# Patient Record
Sex: Female | Born: 1985
Health system: Southern US, Community
[De-identification: ages and names within clinical notes are randomized; demographics above are authoritative.]

## PROBLEM LIST (undated history)

## (undated) ENCOUNTER — Inpatient Hospital Stay (HOSPITAL_COMMUNITY): Payer: Self-pay

## (undated) DIAGNOSIS — F329 Major depressive disorder, single episode, unspecified: Secondary | ICD-10-CM

## (undated) DIAGNOSIS — F419 Anxiety disorder, unspecified: Secondary | ICD-10-CM

## (undated) DIAGNOSIS — I10 Essential (primary) hypertension: Secondary | ICD-10-CM

## (undated) DIAGNOSIS — M1990 Unspecified osteoarthritis, unspecified site: Secondary | ICD-10-CM

## (undated) DIAGNOSIS — K219 Gastro-esophageal reflux disease without esophagitis: Secondary | ICD-10-CM

## (undated) DIAGNOSIS — O039 Complete or unspecified spontaneous abortion without complication: Secondary | ICD-10-CM

## (undated) DIAGNOSIS — C73 Malignant neoplasm of thyroid gland: Secondary | ICD-10-CM

## (undated) DIAGNOSIS — R519 Headache, unspecified: Secondary | ICD-10-CM

## (undated) DIAGNOSIS — F32A Depression, unspecified: Secondary | ICD-10-CM

## (undated) HISTORY — PX: APPENDECTOMY: SHX54

## (undated) HISTORY — DX: Depression, unspecified: F32.A

---
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
## 2009-02-05 ENCOUNTER — Emergency Department (HOSPITAL_COMMUNITY): Admission: EM | Admit: 2009-02-05 | Discharge: 2009-02-06 | Payer: Self-pay | Admitting: Emergency Medicine

## 2009-03-30 ENCOUNTER — Emergency Department (HOSPITAL_COMMUNITY): Admission: EM | Admit: 2009-03-30 | Discharge: 2009-03-30 | Payer: Self-pay | Admitting: Family Medicine

## 2009-08-06 ENCOUNTER — Emergency Department (HOSPITAL_COMMUNITY): Admission: EM | Admit: 2009-08-06 | Discharge: 2009-08-06 | Payer: Self-pay | Admitting: Family Medicine

## 2010-07-13 ENCOUNTER — Emergency Department (HOSPITAL_COMMUNITY)
Admission: EM | Admit: 2010-07-13 | Discharge: 2010-07-13 | Disposition: A | Discharge status: Home / Self Care | Payer: Self-pay | Attending: Emergency Medicine | Admitting: Emergency Medicine

## 2010-07-13 DIAGNOSIS — G44209 Tension-type headache, unspecified, not intractable: Secondary | ICD-10-CM | POA: Insufficient documentation

## 2010-07-13 DIAGNOSIS — I10 Essential (primary) hypertension: Secondary | ICD-10-CM | POA: Insufficient documentation

## 2012-05-26 ENCOUNTER — Emergency Department (HOSPITAL_BASED_OUTPATIENT_CLINIC_OR_DEPARTMENT_OTHER)
Admission: EM | Admit: 2012-05-26 | Discharge: 2012-05-26 | Disposition: A | Discharge status: Home / Self Care | Payer: No Typology Code available for payment source | Attending: Emergency Medicine | Admitting: Emergency Medicine

## 2012-05-26 ENCOUNTER — Emergency Department (HOSPITAL_BASED_OUTPATIENT_CLINIC_OR_DEPARTMENT_OTHER): Payer: No Typology Code available for payment source

## 2012-05-26 ENCOUNTER — Encounter (HOSPITAL_BASED_OUTPATIENT_CLINIC_OR_DEPARTMENT_OTHER): Payer: Self-pay

## 2012-05-26 DIAGNOSIS — M542 Cervicalgia: Secondary | ICD-10-CM

## 2012-05-26 DIAGNOSIS — S39012A Strain of muscle, fascia and tendon of lower back, initial encounter: Secondary | ICD-10-CM

## 2012-05-26 DIAGNOSIS — S0993XA Unspecified injury of face, initial encounter: Secondary | ICD-10-CM | POA: Insufficient documentation

## 2012-05-26 DIAGNOSIS — Y92410 Unspecified street and highway as the place of occurrence of the external cause: Secondary | ICD-10-CM | POA: Insufficient documentation

## 2012-05-26 DIAGNOSIS — S335XXA Sprain of ligaments of lumbar spine, initial encounter: Secondary | ICD-10-CM | POA: Insufficient documentation

## 2012-05-26 DIAGNOSIS — S199XXA Unspecified injury of neck, initial encounter: Secondary | ICD-10-CM | POA: Insufficient documentation

## 2012-05-26 DIAGNOSIS — Y9389 Activity, other specified: Secondary | ICD-10-CM | POA: Insufficient documentation

## 2012-05-26 MED ORDER — OXYCODONE-ACETAMINOPHEN 5-325 MG PO TABS: 1.0000 | ORAL_TABLET | ORAL | Status: DC | PRN

## 2012-05-26 NOTE — ED Notes (Signed)
Pt returned from radiology.
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
## 2012-05-26 NOTE — ED Notes (Signed)
Patient transported to X-ray 

## 2013-08-22 ENCOUNTER — Emergency Department (HOSPITAL_COMMUNITY)
Admission: EM | Admit: 2013-08-22 | Discharge: 2013-08-22 | Disposition: A | Discharge status: Home / Self Care | Payer: BC Managed Care – PPO | Attending: Emergency Medicine | Admitting: Emergency Medicine

## 2013-08-22 ENCOUNTER — Encounter (HOSPITAL_COMMUNITY): Payer: Self-pay | Admitting: Emergency Medicine

## 2013-08-22 DIAGNOSIS — Z880 Allergy status to penicillin: Secondary | ICD-10-CM | POA: Diagnosis not present

## 2013-08-22 DIAGNOSIS — M25569 Pain in unspecified knee: Secondary | ICD-10-CM | POA: Insufficient documentation

## 2013-08-22 DIAGNOSIS — M25562 Pain in left knee: Secondary | ICD-10-CM

## 2013-08-22 MED ORDER — NAPROXEN 500 MG PO TABS: 500.0000 mg | ORAL_TABLET | Freq: Two times a day (BID) | ORAL | Status: DC
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
## 2013-08-22 NOTE — ED Notes (Signed)
Pt reports left knee pain x 1 week that is worse when she stands all day at her job. PT says it gets stiff and sore. Denies fall or injury.

## 2013-08-22 NOTE — Discharge Instructions (Signed)
Take the prescribed medication as directed.  May wish to start wearing knee brace to help with stability/ swelling/ pain Follow-up with Dr. Percell Miller if no improvement in the next few days. Return to the ED for new or worsening symptoms.

## 2013-08-24 NOTE — ED Provider Notes (Signed)
Medical screening examination/treatment/procedure(s) were performed by non-physician practitioner and as supervising physician I was immediately available for consultation/collaboration.  Leota Jacobsen, MD 08/24/13 [phone]

## 2013-10-05 ENCOUNTER — Emergency Department (HOSPITAL_COMMUNITY)
Admission: EM | Admit: 2013-10-05 | Discharge: 2013-10-05 | Discharge status: Against Medical Advice | Payer: BC Managed Care – PPO | Attending: Emergency Medicine | Admitting: Emergency Medicine

## 2013-10-05 ENCOUNTER — Encounter (HOSPITAL_COMMUNITY): Payer: Self-pay | Admitting: Emergency Medicine

## 2013-10-05 DIAGNOSIS — R509 Fever, unspecified: Secondary | ICD-10-CM | POA: Insufficient documentation

## 2013-10-05 DIAGNOSIS — R52 Pain, unspecified: Secondary | ICD-10-CM | POA: Insufficient documentation

## 2013-10-05 DIAGNOSIS — R51 Headache: Secondary | ICD-10-CM | POA: Diagnosis not present

## 2013-10-05 NOTE — ED Notes (Signed)
Pt states last night at work she started having chills  Pt states this morning she woke up with a fever  Took tylenol  Pt states she went to work  Her fever returned  Pt states she has had a headache and body aches since last night

## 2013-10-05 NOTE — ED Notes (Signed)
Pt called x 3 for [facility] room. No answer.

## 2013-10-06 ENCOUNTER — Emergency Department (HOSPITAL_COMMUNITY)
Admission: EM | Admit: 2013-10-06 | Discharge: 2013-10-06 | Disposition: A | Discharge status: Home / Self Care | Payer: BC Managed Care – PPO | Attending: Emergency Medicine | Admitting: Emergency Medicine

## 2013-10-06 ENCOUNTER — Encounter (HOSPITAL_COMMUNITY): Payer: Self-pay | Admitting: Emergency Medicine

## 2013-10-06 DIAGNOSIS — R52 Pain, unspecified: Secondary | ICD-10-CM | POA: Diagnosis not present

## 2013-10-06 DIAGNOSIS — R059 Cough, unspecified: Secondary | ICD-10-CM | POA: Diagnosis not present

## 2013-10-06 DIAGNOSIS — R0602 Shortness of breath: Secondary | ICD-10-CM | POA: Diagnosis not present

## 2013-10-06 DIAGNOSIS — R509 Fever, unspecified: Secondary | ICD-10-CM | POA: Insufficient documentation

## 2013-10-06 DIAGNOSIS — J029 Acute pharyngitis, unspecified: Secondary | ICD-10-CM | POA: Diagnosis present

## 2013-10-06 DIAGNOSIS — Z791 Long term (current) use of non-steroidal anti-inflammatories (NSAID): Secondary | ICD-10-CM | POA: Insufficient documentation

## 2013-10-06 DIAGNOSIS — B9689 Other specified bacterial agents as the cause of diseases classified elsewhere: Secondary | ICD-10-CM

## 2013-10-06 DIAGNOSIS — R05 Cough: Secondary | ICD-10-CM | POA: Insufficient documentation

## 2013-10-06 DIAGNOSIS — J329 Chronic sinusitis, unspecified: Secondary | ICD-10-CM | POA: Insufficient documentation

## 2013-10-06 DIAGNOSIS — Z880 Allergy status to penicillin: Secondary | ICD-10-CM | POA: Insufficient documentation

## 2013-10-06 MED ORDER — SULFAMETHOXAZOLE-TRIMETHOPRIM 800-160 MG PO TABS: 1.0000 | ORAL_TABLET | Freq: Two times a day (BID) | ORAL | Status: DC

## 2013-10-06 MED ORDER — DIPHENHYDRAMINE HCL 25 MG PO TABS: 25.0000 mg | ORAL_TABLET | Freq: Four times a day (QID) | ORAL | Status: DC | PRN

## 2013-10-06 NOTE — ED Notes (Addendum)
Pt reports 2 day hx of fever, dry cough and sore throat. Also, generalized body aches. Treated with Aleve at 1100 today Also added shortness of breath, lungs clear anterior and posterior. No wheezing noted

## 2013-10-06 NOTE — ED Provider Notes (Signed)
CSN: [id number]     Arrival date & time 10/06/13  1502 History  This chart was scribed for non-physician practitioner Alvina Chou, PA-C, working with Tanna Furry, MD by Zola Button, ED Scribe. This patient was seen in room [place] and the patient's care was started at 3:40 PM.      Chief Complaint  Patient presents with  . Fever    temp this am 102.0  . Sore Throat  . Generalized Body Aches  . Cough    dry cough x 2 days  . Shortness of Breath    no wheezing noted     The history is provided by the patient. No language interpreter was used.   HPI Comments: Jessica Mcpherson is a 28 y.o. female who presents to the Emergency Department complaining of gradual onset, constant sore throat that began two days ago. She notes associated fever of 101-102 degrees F, dry cough, SOB, generalized myalgias, sinus pressure and congestion. Patient denies sick contacts, but she works at a school. She has allergies to penicillin.   Past Medical History  Diagnosis Date  . Bronchitis    Past Surgical History  Procedure Laterality Date  . Cesarean section     Family History  Problem Relation Age of Onset  . Diabetes Mother   . Hypertension Other   . Diabetes Other    History  Substance Use Topics  . Smoking status: Never Smoker   . Smokeless tobacco: Not on file  . Alcohol Use: No   OB History   Grav Para Term Preterm Abortions TAB SAB Ect Mult Living                 Review of Systems  Constitutional: Positive for fever.  HENT: Positive for congestion, sinus pressure and sore throat.   Respiratory: Positive for cough and shortness of breath.   Musculoskeletal: Positive for myalgias.  All other systems reviewed and are negative.     Allergies  Penicillins  Home Medications   Prior to Admission medications   Medication Sig Start Date End Date Taking? Authorizing Provider  naproxen (NAPROSYN) 500 MG tablet Take 1 tablet (500 mg total) by mouth 2 (two) times daily with a  meal. 08/22/13   Larene Pickett, PA-C   BP 120/70  Pulse 73  Temp(Src) 98.3 F (36.8 C) (Oral)  Resp 18  Wt 160 lb (72.576 kg)  SpO2 100%  LMP 09/17/2013 Physical Exam  Nursing note and vitals reviewed. Constitutional: She is oriented to person, place, and time. She appears well-developed and well-nourished. No distress.  HENT:  Head: Normocephalic and atraumatic.  Mouth/Throat: Oropharynx is clear and moist. No oropharyngeal exudate.  Eyes: Pupils are equal, round, and reactive to light.  Neck: Neck supple.  Cardiovascular: Normal rate.   Pulmonary/Chest: Effort normal and breath sounds normal. No respiratory distress. She has no wheezes. She has no rales.  Musculoskeletal: She exhibits no edema.  Lymphadenopathy:    She has cervical adenopathy.  Neurological: She is alert and oriented to person, place, and time. No cranial nerve deficit.  Skin: Skin is warm and dry. No rash noted.  Psychiatric: She has a normal mood and affect. Her behavior is normal.    ED Course  Procedures  DIAGNOSTIC STUDIES: Oxygen Saturation is 100% on RA, nml by my interpretation.    COORDINATION OF CARE: 3:42 PM-Discussed treatment plan which includes antihistamines with pt at bedside and pt agreed to plan.   Labs Review Labs Reviewed -  No data to display  Imaging Review No results found.   EKG Interpretation None      MDM   Final diagnoses:  Sinusitis, bacterial    Patient likely has sinusitis and will be treated with amoxicillin and benadryl. Vitals stable and patient afebrile. No further evaluation needed at this time.   I personally performed the services described in this documentation, which was scribed in my presence. The recorded information has been reviewed and is accurate.    Alvina Chou, PA-C 10/06/13 1711

## 2013-10-06 NOTE — Discharge Instructions (Signed)
Take amoxicillin as directed until gone. Take benadryl as needed for congestion. Refer to attached documents for more information.

## 2013-10-11 NOTE — ED Provider Notes (Signed)
Medical screening examination/treatment/procedure(s) were performed by non-physician practitioner and as supervising physician I was immediately available for consultation/collaboration.   EKG Interpretation None        Ephraim Hamburger, MD 10/11/13 1605

## 2013-12-30 ENCOUNTER — Observation Stay (HOSPITAL_COMMUNITY): Payer: BC Managed Care – PPO | Admitting: Anesthesiology

## 2013-12-30 ENCOUNTER — Encounter (HOSPITAL_COMMUNITY)
Admission: EM | Disposition: A | Discharge status: Home / Self Care | Payer: Self-pay | Source: Home / Self Care | Attending: Emergency Medicine

## 2013-12-30 ENCOUNTER — Encounter (HOSPITAL_COMMUNITY): Payer: Self-pay | Admitting: Emergency Medicine

## 2013-12-30 ENCOUNTER — Emergency Department (HOSPITAL_COMMUNITY): Payer: BC Managed Care – PPO

## 2013-12-30 ENCOUNTER — Observation Stay (HOSPITAL_COMMUNITY)
Admission: EM | Admit: 2013-12-30 | Discharge: 2013-12-31 | Disposition: A | Discharge status: Home / Self Care | Payer: BC Managed Care – PPO | Attending: Surgery | Admitting: Surgery

## 2013-12-30 DIAGNOSIS — Z880 Allergy status to penicillin: Secondary | ICD-10-CM | POA: Diagnosis not present

## 2013-12-30 DIAGNOSIS — R1031 Right lower quadrant pain: Secondary | ICD-10-CM | POA: Diagnosis present

## 2013-12-30 DIAGNOSIS — N898 Other specified noninflammatory disorders of vagina: Secondary | ICD-10-CM | POA: Diagnosis not present

## 2013-12-30 DIAGNOSIS — R109 Unspecified abdominal pain: Secondary | ICD-10-CM

## 2013-12-30 DIAGNOSIS — A599 Trichomoniasis, unspecified: Secondary | ICD-10-CM | POA: Diagnosis not present

## 2013-12-30 DIAGNOSIS — K353 Acute appendicitis with localized peritonitis, without perforation or gangrene: Secondary | ICD-10-CM

## 2013-12-30 DIAGNOSIS — K3580 Unspecified acute appendicitis: Principal | ICD-10-CM | POA: Diagnosis present

## 2013-12-30 HISTORY — PX: LAPAROSCOPIC APPENDECTOMY: SHX408

## 2013-12-30 LAB — WET PREP, GENITAL
Clue Cells Wet Prep HPF POC: NONE SEEN
Yeast Wet Prep HPF POC: NONE SEEN

## 2013-12-30 LAB — CBC WITH DIFFERENTIAL/PLATELET
Basophils Absolute: 0 10*3/uL (ref 0.0–0.1)
Basophils Relative: 0 % (ref 0–1)
Eosinophils Absolute: 0.2 10*3/uL (ref 0.0–0.7)
Eosinophils Relative: 1 % (ref 0–5)
HCT: 39.3 % (ref 36.0–46.0)
Hemoglobin: 13.3 g/dL (ref 12.0–15.0)
Lymphocytes Relative: 19 % (ref 12–46)
Lymphs Abs: 2.8 10*3/uL (ref 0.7–4.0)
MCH: 29.7 pg (ref 26.0–34.0)
MCHC: 33.8 g/dL (ref 30.0–36.0)
MCV: 87.7 fL (ref 78.0–100.0)
Monocytes Absolute: 1 10*3/uL (ref 0.1–1.0)
Monocytes Relative: 7 % (ref 3–12)
Neutro Abs: 10.6 10*3/uL — ABNORMAL HIGH (ref 1.7–7.7)
Neutrophils Relative %: 73 % (ref 43–77)
Platelets: 257 10*3/uL (ref 150–400)
RBC: 4.48 MIL/uL (ref 3.87–5.11)
RDW: 13.4 % (ref 11.5–15.5)
WBC: 14.6 10*3/uL — ABNORMAL HIGH (ref 4.0–10.5)

## 2013-12-30 LAB — URINALYSIS, ROUTINE W REFLEX MICROSCOPIC
Bilirubin Urine: NEGATIVE
Glucose, UA: NEGATIVE mg/dL
Hgb urine dipstick: NEGATIVE
Ketones, ur: NEGATIVE mg/dL
Nitrite: NEGATIVE
Protein, ur: NEGATIVE mg/dL
Specific Gravity, Urine: 1.026 (ref 1.005–1.030)
Urobilinogen, UA: 0.2 mg/dL (ref 0.0–1.0)
pH: 6 (ref 5.0–8.0)

## 2013-12-30 LAB — URINE MICROSCOPIC-ADD ON

## 2013-12-30 LAB — COMPREHENSIVE METABOLIC PANEL
ALT: 16 U/L (ref 0–35)
AST: 17 U/L (ref 0–37)
Albumin: 3.6 g/dL (ref 3.5–5.2)
Alkaline Phosphatase: 68 U/L (ref 39–117)
Anion gap: 12 (ref 5–15)
BUN: 10 mg/dL (ref 6–23)
CO2: 22 mEq/L (ref 19–32)
Calcium: 9.2 mg/dL (ref 8.4–10.5)
Chloride: 103 mEq/L (ref 96–112)
Creatinine, Ser: 0.73 mg/dL (ref 0.50–1.10)
GFR calc Af Amer: >90 mL/min (ref >90)
GFR calc non Af Amer: >90 mL/min (ref >90)
Glucose, Bld: 95 mg/dL (ref 70–99)
Potassium: 4.1 mEq/L (ref 3.7–5.3)
Sodium: 137 mEq/L (ref 137–147)
Total Bilirubin: 0.3 mg/dL (ref 0.3–1.2)
Total Protein: 7.5 g/dL (ref 6.0–8.3)

## 2013-12-30 LAB — LIPASE, BLOOD: Lipase: 15 U/L (ref 11–59)

## 2013-12-30 LAB — PREGNANCY, URINE: Preg Test, Ur: NEGATIVE

## 2013-12-30 SURGERY — APPENDECTOMY, LAPAROSCOPIC
Anesthesia: General | Site: Abdomen

## 2013-12-30 MED ORDER — ROCURONIUM BROMIDE 100 MG/10ML IV SOLN
INTRAVENOUS | Status: AC
  Filled 2013-12-30: qty 1

## 2013-12-30 MED ORDER — IOHEXOL 300 MG/ML  SOLN
50.0000 mL | Freq: Once | INTRAMUSCULAR | Status: AC | PRN
  Administered 2013-12-30: 50 mL via ORAL

## 2013-12-30 MED ORDER — PHENYLEPHRINE 40 MCG/ML (10ML) SYRINGE FOR IV PUSH (FOR BLOOD PRESSURE SUPPORT)
PREFILLED_SYRINGE | INTRAVENOUS | Status: AC
  Filled 2013-12-30: qty 10

## 2013-12-30 MED ORDER — SODIUM CHLORIDE 0.9 % IV BOLUS (SEPSIS)
1000.0000 mL | Freq: Once | INTRAVENOUS | Status: AC
  Administered 2013-12-30: 1000 mL via INTRAVENOUS

## 2013-12-30 MED ORDER — IOHEXOL 300 MG/ML  SOLN
100.0000 mL | Freq: Once | INTRAMUSCULAR | Status: AC | PRN
  Administered 2013-12-30: 100 mL via INTRAVENOUS

## 2013-12-30 MED ORDER — DIPHENHYDRAMINE HCL 50 MG/ML IJ SOLN: 12.5000 mg | Freq: Four times a day (QID) | INTRAMUSCULAR | Status: DC | PRN

## 2013-12-30 MED ORDER — DEXTROSE 5 % IV SOLN
1.0000 g | Freq: Once | INTRAVENOUS | Status: AC
  Administered 2013-12-30: 1 g via INTRAVENOUS
  Filled 2013-12-30: qty 10

## 2013-12-30 MED ORDER — KCL-LACTATED RINGERS-D5W 20 MEQ/L IV SOLN
INTRAVENOUS | Status: DC
  Administered 2013-12-30 – 2013-12-31 (×2): via INTRAVENOUS
  Filled 2013-12-30 (×4): qty 1000

## 2013-12-30 MED ORDER — PROMETHAZINE HCL 25 MG/ML IJ SOLN
12.5000 mg | INTRAMUSCULAR | Status: DC | PRN
  Administered 2013-12-30: 12.5 mg via INTRAVENOUS

## 2013-12-30 MED ORDER — LACTATED RINGERS IR SOLN
Status: DC | PRN
  Administered 2013-12-30 (×2): 1

## 2013-12-30 MED ORDER — SODIUM CHLORIDE 0.9 % IJ SOLN
INTRAMUSCULAR | Status: AC
  Filled 2013-12-30: qty 10

## 2013-12-30 MED ORDER — ONDANSETRON HCL 4 MG/2ML IJ SOLN
INTRAMUSCULAR | Status: AC
  Filled 2013-12-30: qty 2

## 2013-12-30 MED ORDER — HYDROMORPHONE HCL 1 MG/ML IJ SOLN
0.5000 mg | Freq: Once | INTRAMUSCULAR | Status: AC
  Administered 2013-12-30: 0.5 mg via INTRAVENOUS
  Filled 2013-12-30: qty 1

## 2013-12-30 MED ORDER — GLYCOPYRROLATE 0.2 MG/ML IJ SOLN
INTRAMUSCULAR | Status: DC | PRN
  Administered 2013-12-30: 0.6 mg via INTRAVENOUS

## 2013-12-30 MED ORDER — PROMETHAZINE HCL 25 MG/ML IJ SOLN
INTRAMUSCULAR | Status: AC
  Filled 2013-12-30: qty 1

## 2013-12-30 MED ORDER — DEXAMETHASONE SODIUM PHOSPHATE 10 MG/ML IJ SOLN
INTRAMUSCULAR | Status: AC
  Filled 2013-12-30: qty 1

## 2013-12-30 MED ORDER — SUCCINYLCHOLINE CHLORIDE 20 MG/ML IJ SOLN
INTRAMUSCULAR | Status: DC | PRN
  Administered 2013-12-30: 100 mg via INTRAVENOUS

## 2013-12-30 MED ORDER — METRONIDAZOLE IN NACL 5-0.79 MG/ML-% IV SOLN
500.0000 mg | Freq: Three times a day (TID) | INTRAVENOUS | Status: DC
  Administered 2013-12-30 – 2013-12-31 (×3): 500 mg via INTRAVENOUS
  Filled 2013-12-30 (×3): qty 100

## 2013-12-30 MED ORDER — MORPHINE SULFATE 2 MG/ML IJ SOLN
2.0000 mg | INTRAMUSCULAR | Status: DC | PRN
  Administered 2013-12-30 – 2013-12-31 (×2): 2 mg via INTRAVENOUS
  Filled 2013-12-30 (×2): qty 1

## 2013-12-30 MED ORDER — ONDANSETRON HCL 4 MG/2ML IJ SOLN
INTRAMUSCULAR | Status: DC | PRN
  Administered 2013-12-30: 4 mg via INTRAVENOUS

## 2013-12-30 MED ORDER — ACETAMINOPHEN 325 MG PO TABS: 650.0000 mg | ORAL_TABLET | Freq: Four times a day (QID) | ORAL | Status: DC | PRN

## 2013-12-30 MED ORDER — ONDANSETRON HCL 4 MG/2ML IJ SOLN: 4.0000 mg | INTRAMUSCULAR | Status: DC | PRN

## 2013-12-30 MED ORDER — NEOSTIGMINE METHYLSULFATE 10 MG/10ML IV SOLN
INTRAVENOUS | Status: DC | PRN
  Administered 2013-12-30: 4 mg via INTRAVENOUS

## 2013-12-30 MED ORDER — PROPOFOL 10 MG/ML IV BOLUS
INTRAVENOUS | Status: AC
  Filled 2013-12-30: qty 20

## 2013-12-30 MED ORDER — MIDAZOLAM HCL 2 MG/2ML IJ SOLN
INTRAMUSCULAR | Status: AC
  Filled 2013-12-30: qty 2

## 2013-12-30 MED ORDER — ACETAMINOPHEN 10 MG/ML IV SOLN
1000.0000 mg | Freq: Once | INTRAVENOUS | Status: AC
  Administered 2013-12-30: 1000 mg via INTRAVENOUS
  Filled 2013-12-30: qty 100

## 2013-12-30 MED ORDER — LACTATED RINGERS IV SOLN
INTRAVENOUS | Status: DC
  Administered 2013-12-30 (×2): 1000 mL via INTRAVENOUS

## 2013-12-30 MED ORDER — ACETAMINOPHEN 650 MG RE SUPP: 650.0000 mg | Freq: Four times a day (QID) | RECTAL | Status: DC | PRN

## 2013-12-30 MED ORDER — GLYCOPYRROLATE 0.2 MG/ML IJ SOLN
INTRAMUSCULAR | Status: AC
  Filled 2013-12-30: qty 3

## 2013-12-30 MED ORDER — LACTATED RINGERS IV SOLN: INTRAVENOUS | Status: DC

## 2013-12-30 MED ORDER — POTASSIUM CHLORIDE IN NACL 20-0.9 MEQ/L-% IV SOLN
INTRAVENOUS | Status: DC
  Administered 2013-12-30: 10:00:00 via INTRAVENOUS
  Filled 2013-12-30 (×2): qty 1000

## 2013-12-30 MED ORDER — PROPOFOL 10 MG/ML IV BOLUS
INTRAVENOUS | Status: DC | PRN
  Administered 2013-12-30: 120 mg via INTRAVENOUS

## 2013-12-30 MED ORDER — BUPIVACAINE HCL (PF) 0.5 % IJ SOLN
INTRAMUSCULAR | Status: DC | PRN
  Administered 2013-12-30: 10 mL

## 2013-12-30 MED ORDER — DIPHENHYDRAMINE HCL 12.5 MG/5ML PO ELIX: 12.5000 mg | ORAL_SOLUTION | Freq: Four times a day (QID) | ORAL | Status: DC | PRN

## 2013-12-30 MED ORDER — METRONIDAZOLE IN NACL 5-0.79 MG/ML-% IV SOLN
500.0000 mg | Freq: Once | INTRAVENOUS | Status: DC
  Filled 2013-12-30: qty 100

## 2013-12-30 MED ORDER — BUPIVACAINE HCL (PF) 0.5 % IJ SOLN
INTRAMUSCULAR | Status: AC
  Filled 2013-12-30: qty 30

## 2013-12-30 MED ORDER — FENTANYL CITRATE 0.05 MG/ML IJ SOLN
INTRAMUSCULAR | Status: AC
  Filled 2013-12-30: qty 5

## 2013-12-30 MED ORDER — MIDAZOLAM HCL 5 MG/5ML IJ SOLN
INTRAMUSCULAR | Status: DC | PRN
  Administered 2013-12-30: 2 mg via INTRAVENOUS

## 2013-12-30 MED ORDER — NEOSTIGMINE METHYLSULFATE 10 MG/10ML IV SOLN
INTRAVENOUS | Status: AC
  Filled 2013-12-30: qty 1

## 2013-12-30 MED ORDER — OXYCODONE HCL 5 MG PO TABS: 5.0000 mg | ORAL_TABLET | ORAL | Status: DC | PRN

## 2013-12-30 MED ORDER — MORPHINE SULFATE 2 MG/ML IJ SOLN: 1.0000 mg | INTRAMUSCULAR | Status: DC | PRN

## 2013-12-30 MED ORDER — FENTANYL CITRATE 0.05 MG/ML IJ SOLN: 25.0000 ug | INTRAMUSCULAR | Status: DC | PRN

## 2013-12-30 MED ORDER — ONDANSETRON HCL 4 MG PO TABS: 4.0000 mg | ORAL_TABLET | Freq: Four times a day (QID) | ORAL | Status: DC | PRN

## 2013-12-30 MED ORDER — ONDANSETRON HCL 4 MG/2ML IJ SOLN: 4.0000 mg | Freq: Four times a day (QID) | INTRAMUSCULAR | Status: DC | PRN

## 2013-12-30 MED ORDER — ROCURONIUM BROMIDE 100 MG/10ML IV SOLN
INTRAVENOUS | Status: DC | PRN
  Administered 2013-12-30: 10 mg via INTRAVENOUS
  Administered 2013-12-30: 20 mg via INTRAVENOUS

## 2013-12-30 MED ORDER — FENTANYL CITRATE 0.05 MG/ML IJ SOLN
INTRAMUSCULAR | Status: DC | PRN
  Administered 2013-12-30: 25 ug via INTRAVENOUS
  Administered 2013-12-30 (×2): 50 ug via INTRAVENOUS
  Administered 2013-12-30: 25 ug via INTRAVENOUS

## 2013-12-30 MED ORDER — DEXAMETHASONE SODIUM PHOSPHATE 10 MG/ML IJ SOLN
INTRAMUSCULAR | Status: DC | PRN
  Administered 2013-12-30: 10 mg via INTRAVENOUS

## 2013-12-30 SURGICAL SUPPLY — 40 items
APPLIER CLIP 5 13 M/L LIGAMAX5 (MISCELLANEOUS)
APPLIER CLIP ROT 10 11.4 M/L (STAPLE)
BENZOIN TINCTURE PRP APPL 2/3 (GAUZE/BANDAGES/DRESSINGS) IMPLANT
CHLORAPREP W/TINT 26ML (MISCELLANEOUS) ×2 IMPLANT
CLIP APPLIE 5 13 M/L LIGAMAX5 (MISCELLANEOUS) IMPLANT
CLIP APPLIE ROT 10 11.4 M/L (STAPLE) IMPLANT
CUTTER FLEX LINEAR 45M (STAPLE) ×2 IMPLANT
DECANTER SPIKE VIAL GLASS SM (MISCELLANEOUS) ×2 IMPLANT
DRAIN CHANNEL 19F RND (DRAIN) IMPLANT
DRAPE LAPAROSCOPIC ABDOMINAL (DRAPES) ×2 IMPLANT
DRSG TEGADERM 2-3/8X2-3/4 SM (GAUZE/BANDAGES/DRESSINGS) ×4 IMPLANT
ELECT REM PT RETURN 9FT ADLT (ELECTROSURGICAL) ×2
ELECTRODE REM PT RTRN 9FT ADLT (ELECTROSURGICAL) ×1 IMPLANT
ENDOLOOP SUT PDS II  0 18 (SUTURE)
ENDOLOOP SUT PDS II 0 18 (SUTURE) IMPLANT
EVACUATOR SILICONE 100CC (DRAIN) IMPLANT
GAUZE SPONGE 2X2 8PLY STRL LF (GAUZE/BANDAGES/DRESSINGS) ×1 IMPLANT
GLOVE ECLIPSE 8.0 STRL XLNG CF (GLOVE) ×2 IMPLANT
GLOVE INDICATOR 8.0 STRL GRN (GLOVE) ×2 IMPLANT
GOWN STRL REUS W/TWL XL LVL3 (GOWN DISPOSABLE) ×4 IMPLANT
KIT BASIN OR (CUSTOM PROCEDURE TRAY) ×2 IMPLANT
POUCH SPECIMEN RETRIEVAL 10MM (ENDOMECHANICALS) ×2 IMPLANT
RELOAD 45 VASCULAR/THIN (ENDOMECHANICALS) IMPLANT
RELOAD STAPLE TA45 3.5 REG BLU (ENDOMECHANICALS) ×2 IMPLANT
SCISSORS LAP 5X35 DISP (ENDOMECHANICALS) ×2 IMPLANT
SET IRRIG TUBING LAPAROSCOPIC (IRRIGATION / IRRIGATOR) ×2 IMPLANT
SHEARS HARMONIC ACE PLUS 36CM (ENDOMECHANICALS) ×2 IMPLANT
SLEEVE XCEL OPT CAN 5 100 (ENDOMECHANICALS) ×2 IMPLANT
SOLUTION ANTI FOG 6CC (MISCELLANEOUS) ×2 IMPLANT
SPONGE GAUZE 2X2 STER 10/PKG (GAUZE/BANDAGES/DRESSINGS) ×1
STRIP CLOSURE SKIN 1/2X4 (GAUZE/BANDAGES/DRESSINGS) ×2 IMPLANT
SUT ETHILON 3 0 PS 1 (SUTURE) IMPLANT
SUT MNCRL AB 4-0 PS2 18 (SUTURE) ×2 IMPLANT
TOWEL OR 17X26 10 PK STRL BLUE (TOWEL DISPOSABLE) ×2 IMPLANT
TOWEL OR NON WOVEN STRL DISP B (DISPOSABLE) ×2 IMPLANT
TRAY FOLEY CATH 14FRSI W/METER (CATHETERS) ×2 IMPLANT
TRAY LAPAROSCOPIC (CUSTOM PROCEDURE TRAY) ×2 IMPLANT
TROCAR BLADELESS OPT 5 100 (ENDOMECHANICALS) ×2 IMPLANT
TROCAR XCEL BLUNT TIP 100MML (ENDOMECHANICALS) ×2 IMPLANT
TUBING INSUFFLATION 10FT LAP (TUBING) ×2 IMPLANT

## 2013-12-30 NOTE — Transfer of Care (Signed)
Immediate Anesthesia Transfer of Care Note  Patient: Jessica Mcpherson  Procedure(s) Performed: Procedure(s): APPENDECTOMY LAPAROSCOPIC (N/A)  Patient Location: PACU  Anesthesia Type:General  Level of Consciousness: awake, alert , oriented and patient cooperative  Airway & Oxygen Therapy: Patient Spontanous Breathing and Patient connected to face mask oxygen  Post-op Assessment: Report given to PACU RN and Post -op Vital signs reviewed and stable  Post vital signs: Reviewed and stable  Complications: No apparent anesthesia complications

## 2013-12-30 NOTE — Anesthesia Postprocedure Evaluation (Signed)
  Anesthesia Post-op Note  Patient: Jessica Mcpherson  Procedure(s) Performed: Procedure(s) (LRB): APPENDECTOMY LAPAROSCOPIC (N/A)  Patient Location: PACU  Anesthesia Type: General  Level of Consciousness: awake and alert   Airway and Oxygen Therapy: Patient Spontanous Breathing  Post-op Pain: mild  Post-op Assessment: Post-op Vital signs reviewed, Patient's Cardiovascular Status Stable, Respiratory Function Stable, Patent Airway and No signs of Nausea or vomiting  Last Vitals:  Filed Vitals:   12/30/13 1500  BP: 103/64  Pulse: 75  Temp: 36.7 C  Resp: 16    Post-op Vital Signs: stable   Complications: No apparent anesthesia complications

## 2013-12-30 NOTE — ED Provider Notes (Signed)
Patient signed out to me by Harvie Heck, PA-C with plan to follow up with surgery consult.   [name] is a 28 year old female who presented to the ER with acute abdominal pain for the past 2 days, worsening since last night, and localizing in her right lower quadrant last night. This was worked up with symptomatically therapy and CT abdomen pelvis which was remarkable for an acute supportive appendicitis. Patient also had a pelvic exam with wet prep remarkable for trichomonas. Surgery consult was placed, and due to penicillin allergy patient was placed on ceftriaxone and Flagyl IV.  PE: Constitutional: well-developed, well-nourished, no apparent distress HENT: normocephalic, atraumatic Cardiovascular: normal rate and rhythm, distal pulses intact Pulmonary/Chest: effort normal; breath sounds clear and equal bilaterally; no wheezes or rales Abdominal: Soft with tenderness in the right lower quadrant. GU exam deferred based on previous exam during visit. Musculoskeletal: full ROM, no edema Lymphadenopathy: no cervical adenopathy Neurological: alert with goal directed thinking Skin: warm and dry, no rash, no diaphoresis Psychiatric: normal mood and affect, normal behavior   During assessment patient states her pain had increased back to the level of pain she is having when she presented. I will re-dose patient with the same dose given earlier. I spoke with Creig Hines, PA-C who came to see patient for surgical consult.  Patient seen and assessed by surgery, patient admitted for acute appendicitis. The patient appears reasonably stabilized for admission considering the current resources, flow, and capabilities available in the ED at this time, and I doubt any other [facility] requiring further screening and/or treatment in the ED prior to admission.  BP 122/84 mmHg  Pulse 72  Temp(Src) 97.8 F (36.6 C) (Oral)  Resp 16  SpO2 100%  LMP 12/17/2013 (Exact Date)  Signed,  Dahlia Bailiff, PA-C 10:09  AM   Carrie Mew, PA-C 12/30/13 1622  Everlene Balls, MD 12/31/13 1410

## 2013-12-30 NOTE — ED Notes (Signed)
Patient transported to CT 

## 2013-12-30 NOTE — Anesthesia Preprocedure Evaluation (Signed)
Anesthesia Evaluation  Patient identified by MRN, date of birth, ID band Patient awake    Reviewed: Allergy & Precautions, H&P , NPO status , Patient's Chart, lab work & pertinent test results  Airway Mallampati: II  TM Distance: >3 FB Neck ROM: full    Dental no notable dental hx. (+) Teeth Intact, Dental Advisory Given   Pulmonary neg pulmonary ROS,  breath sounds clear to auscultation  Pulmonary exam normal       Cardiovascular Exercise Tolerance: Good negative cardio ROS  Rhythm:regular Rate:Normal     Neuro/Psych negative neurological ROS  negative psych ROS   GI/Hepatic negative GI ROS, Neg liver ROS,   Endo/Other  negative endocrine ROS  Renal/GU negative Renal ROS  negative genitourinary   Musculoskeletal   Abdominal   Peds  Hematology negative hematology ROS (+)   Anesthesia Other Findings   Reproductive/Obstetrics negative OB ROS                             Anesthesia Physical Anesthesia Plan  ASA: I and emergent  Anesthesia Plan: General   Post-op Pain Management:    Induction: Intravenous, Rapid sequence and Cricoid pressure planned  Airway Management Planned: Oral ETT  Additional Equipment:   Intra-op Plan:   Post-operative Plan: Extubation in OR  Informed Consent: I have reviewed the patients History and Physical, chart, labs and discussed the procedure including the risks, benefits and alternatives for the proposed anesthesia with the patient or authorized representative who has indicated his/her understanding and acceptance.   Dental Advisory Given  Plan Discussed with: CRNA and Surgeon  Anesthesia Plan Comments:         Anesthesia Quick Evaluation

## 2013-12-30 NOTE — ED Notes (Signed)
Pt is c/o abd pain that started 2 days ago  Pt states the pain is progressively getting worse  Pt states her abdomen is tender to palpation  Pt has nausea without vomiting or diarrhea

## 2013-12-30 NOTE — ED Notes (Addendum)
Surgery at bedside.

## 2013-12-30 NOTE — H&P (Signed)
Jessica Mcpherson is an 28 y.o. female.   Chief Complaint: Appendicitis HPI: 28 y/o presents with 2 days of abdominal pain to RLQ.  Nausea, no emesis.  She also has a new vagina discharge.    Work up shows few Trichomonas on wet prep.  CMP is normal, WBC is 14.6.  CT scan shows:Dilated and fluid-filled appendix, measuring up to 14 mm outer wall diameter. There is surrounding fat and peritoneal inflammation with mild edema in the cecum. Reactive ileocolic lymphadenopathy. Although the inflammatory changes are advanced, no discontinuity of the wall or periappendiceal abscess/ fluid collection is identified. This is consistent with acute appendicitis and we are ask to call  Past Medical History  Diagnosis Date  . Bronchitis     Past Surgical History  Procedure Laterality Date  . Cesarean section      Family History  Problem Relation Age of Onset  . Diabetes Mother   . Hypertension Mother   . Hypertension Other   . Diabetes Other    Social History:  reports that she has never smoked. She does not have any smokeless tobacco history on file. She reports that she does not drink alcohol or use illicit drugs. Tobacco:  None Drugs: none ETOH:  None Works 2 jobs, Single Allergies:  Allergies  Allergen Reactions  . Penicillins Hives   Prior to Admission medications   Medication Sig Start Date End Date Taking? Authorizing Provider  acetaminophen (TYLENOL) 500 MG tablet Take 1,000 mg by mouth every 6 (six) hours as needed for mild pain.   Yes Historical Provider, MD  diphenhydrAMINE (BENADRYL) 25 MG tablet Take 1 tablet (25 mg total) by mouth every 6 (six) hours as needed for itching (Rash). Patient not taking: Reported on 12/30/2013 10/06/13   Alvina Chou, PA-C  naproxen (NAPROSYN) 500 MG tablet Take 1 tablet (500 mg total) by mouth 2 (two) times daily with a meal. Patient not taking: Reported on 12/30/2013 08/22/13   Larene Pickett, PA-C  sulfamethoxazole-trimethoprim (SEPTRA DS)  800-160 MG per tablet Take 1 tablet by mouth every 12 (twelve) hours. Patient not taking: Reported on 12/30/2013 10/06/13   Alvina Chou, PA-C      Results for orders placed or performed during the hospital encounter of 12/30/13 (from the past 48 hour(s))  Urinalysis, Routine w reflex microscopic     Status: Abnormal   Collection Time: 12/30/13  4:26 AM  Result Value Ref Range   Color, Urine YELLOW YELLOW   APPearance CLEAR CLEAR   Specific Gravity, Urine 1.026 1.005 - 1.030   pH 6.0 5.0 - 8.0   Glucose, UA NEGATIVE NEGATIVE mg/dL   Hgb urine dipstick NEGATIVE NEGATIVE   Bilirubin Urine NEGATIVE NEGATIVE   Ketones, ur NEGATIVE NEGATIVE mg/dL   Protein, ur NEGATIVE NEGATIVE mg/dL   Urobilinogen, UA 0.2 0.0 - 1.0 mg/dL   Nitrite NEGATIVE NEGATIVE   Leukocytes, UA MODERATE (A) NEGATIVE  Pregnancy, urine     Status: None   Collection Time: 12/30/13  4:26 AM  Result Value Ref Range   Preg Test, Ur NEGATIVE NEGATIVE    Comment:        THE SENSITIVITY OF THIS METHODOLOGY IS >20 mIU/mL.   Urine microscopic-add on     Status: Abnormal   Collection Time: 12/30/13  4:26 AM  Result Value Ref Range   Squamous Epithelial / LPF FEW (A) RARE   WBC, UA 3-6 <3 WBC/hpf   RBC / HPF 0-2 <3 RBC/hpf   Bacteria, UA  FEW (A) RARE   Urine-Other MUCOUS PRESENT   CBC with Differential     Status: Abnormal   Collection Time: 12/30/13  4:35 AM  Result Value Ref Range   WBC 14.6 (H) 4.0 - 10.5 K/uL   RBC 4.48 3.87 - 5.11 MIL/uL   Hemoglobin 13.3 12.0 - 15.0 g/dL   HCT 39.3 36.0 - 46.0 %   MCV 87.7 78.0 - 100.0 fL   MCH 29.7 26.0 - 34.0 pg   MCHC 33.8 30.0 - 36.0 g/dL   RDW 13.4 11.5 - 15.5 %   Platelets 257 150 - 400 K/uL   Neutrophils Relative % 73 43 - 77 %   Neutro Abs 10.6 (H) 1.7 - 7.7 K/uL   Lymphocytes Relative 19 12 - 46 %   Lymphs Abs 2.8 0.7 - 4.0 K/uL   Monocytes Relative 7 3 - 12 %   Monocytes Absolute 1.0 0.1 - 1.0 K/uL   Eosinophils Relative 1 0 - 5 %   Eosinophils Absolute  0.2 0.0 - 0.7 K/uL   Basophils Relative 0 0 - 1 %   Basophils Absolute 0.0 0.0 - 0.1 K/uL  Comprehensive metabolic panel     Status: None   Collection Time: 12/30/13  4:35 AM  Result Value Ref Range   Sodium 137 137 - 147 mEq/L   Potassium 4.1 3.7 - 5.3 mEq/L   Chloride 103 96 - 112 mEq/L   CO2 22 19 - 32 mEq/L   Glucose, Bld 95 70 - 99 mg/dL   BUN 10 6 - 23 mg/dL   Creatinine, Ser 0.73 0.50 - 1.10 mg/dL   Calcium 9.2 8.4 - 10.5 mg/dL   Total Protein 7.5 6.0 - 8.3 g/dL   Albumin 3.6 3.5 - 5.2 g/dL   AST 17 0 - 37 U/L   ALT 16 0 - 35 U/L   Alkaline Phosphatase 68 39 - 117 U/L   Total Bilirubin 0.3 0.3 - 1.2 mg/dL   GFR calc non Af Amer >90 >90 mL/min   GFR calc Af Amer >90 >90 mL/min    Comment: (NOTE) The eGFR has been calculated using the CKD EPI equation. This calculation has not been validated in all clinical situations. eGFR's persistently <90 mL/min signify possible Chronic Kidney Disease.    Anion gap 12 5 - 15  Lipase, blood     Status: None   Collection Time: 12/30/13  4:35 AM  Result Value Ref Range   Lipase 15 11 - 59 U/L  Wet prep, genital     Status: Abnormal   Collection Time: 12/30/13  4:59 AM  Result Value Ref Range   Yeast Wet Prep HPF POC NONE SEEN NONE SEEN   Trich, Wet Prep FEW (A) NONE SEEN   Clue Cells Wet Prep HPF POC NONE SEEN NONE SEEN   WBC, Wet Prep HPF POC FEW (A) NONE SEEN   Ct Abdomen Pelvis W Contrast  12/30/2013   CLINICAL DATA:  Abdominal pain and nausea, increasing for 2 days.  EXAM: CT ABDOMEN AND PELVIS WITH CONTRAST  TECHNIQUE: Multidetector CT imaging of the abdomen and pelvis was performed using the standard protocol following bolus administration of intravenous contrast.  CONTRAST:  181m OMNIPAQUE IOHEXOL 300 MG/ML  SOLN  COMPARISON:  None.  FINDINGS: BODY WALL: Unremarkable.  LOWER CHEST: Unremarkable.  ABDOMEN/PELVIS:  Liver: No focal abnormality.  Biliary: No evidence of biliary obstruction or stone.  Pancreas: Unremarkable.   Spleen: Unremarkable.  Adrenals: Unremarkable.  Kidneys and  ureters: No hydronephrosis or stone.  Bladder: Unremarkable.  Reproductive: Unremarkable.  Bowel: Dilated and fluid-filled appendix, measuring up to 14 mm outer wall diameter. There is surrounding fat and peritoneal inflammation with mild edema in the cecum. Reactive ileocolic lymphadenopathy. Although the inflammatory changes are advanced, no discontinuity of the wall or periappendiceal abscess/ fluid collection is identified.  Retroperitoneum: No mass or adenopathy.  Vascular: No acute abnormality.  OSSEOUS: No acute abnormalities.  IMPRESSION: Acute suppurative appendicitis. The inflammation is advanced but no perforation currently.   Electronically Signed   By: Jorje Guild M.D.   On: 12/30/2013 06:47    Review of Systems  Constitutional: Negative.   HENT: Negative.   Eyes: Negative.   Respiratory: Negative.   Cardiovascular: Negative.   Gastrointestinal: Positive for nausea and abdominal pain (RLQ). Negative for heartburn, vomiting, diarrhea, constipation, blood in stool and melena.  Genitourinary:       Vaginal discharge  Musculoskeletal: Negative.   Skin: Negative.   Neurological: Negative.   Endo/Heme/Allergies: Negative.     Blood pressure 122/84, pulse 72, temperature 97.8 F (36.6 C), temperature source Oral, resp. rate 16, last menstrual period 12/17/2013, SpO2 100 %. Physical Exam  Constitutional: She is oriented to person, place, and time. She appears well-developed and well-nourished. No distress.  HENT:  Head: Normocephalic and atraumatic.  Nose: Nose normal.  Eyes: Conjunctivae and EOM are normal. Pupils are equal, round, and reactive to light. Right eye exhibits no discharge. Left eye exhibits no discharge. No scleral icterus.  Neck: Normal range of motion. Neck supple. No JVD present. No tracheal deviation present. No thyromegaly present.  Cardiovascular: Normal rate, regular rhythm, normal heart sounds and  intact distal pulses.   Respiratory: Effort normal and breath sounds normal. No respiratory distress. She has no wheezes. She has no rales. She exhibits no tenderness.  GI: Soft. Bowel sounds are normal. She exhibits no distension and no mass. There is tenderness (RLQ). There is no rebound and no guarding.  Genitourinary: Vaginal discharge (reported, I did not examine,  wet prep + for trichamonas) found.  Musculoskeletal: She exhibits no edema or tenderness.  Lymphadenopathy:    She has no cervical adenopathy.  Neurological: She is alert and oriented to person, place, and time. No cranial nerve deficit.  Skin: Skin is warm. No rash noted. She is not diaphoretic. No erythema. No pallor.  Psychiatric: She has a normal mood and affect. Her behavior is normal. Judgment and thought content normal.     Assessment/Plan 1.  Acute appendicitis 2.  Vaginal discharge with few trichomonas on wet prep.  Plan:  She has been started on antibiotics for Appendicitis, and we will keep her on Flagyl till she has 2 grams before or on discharge.   Nohea Kras 12/30/2013, 7:58 AM

## 2013-12-30 NOTE — Discharge Instructions (Signed)
Laparoscopic Appendectomy °Care After °Refer to this sheet in the next few weeks. These instructions provide you with information on caring for yourself after your procedure. Your caregiver may also give you more specific instructions. Your treatment has been planned according to current medical practices, but problems sometimes occur. Call your caregiver if you have any problems or questions after your procedure. °HOME CARE INSTRUCTIONS °· Do not drive while taking narcotic pain medicines. °· Use stool softener if you become constipated from your pain medicines. °· Change your bandages (dressings) as directed. °· Keep your wounds clean and dry. You may wash the wounds gently with soap and water. Gently pat the wounds dry with a clean towel. °· Do not take baths, swim, or use hot tubs for 10 days, or as instructed by your caregiver. °· Only take over-the-counter or prescription medicines for pain, discomfort, or fever as directed by your caregiver. °· You may continue your normal diet as directed. °· Do not lift more than 10 pounds (4.5 kg) or play contact sports for 3 weeks, or as directed. °· Slowly increase your activity after surgery. °· Take deep breaths to avoid getting a lung infection (pneumonia). °SEEK MEDICAL CARE IF: °· You have redness, swelling, or increasing pain in your wounds. °· You have pus coming from your wounds. °· You have drainage from a wound that lasts longer than 1 day. °· You notice a bad smell coming from the wounds or dressing. °· Your wound edges break open after stitches (sutures) have been removed. °· You notice increasing pain in the shoulders (shoulder strap areas) or near your shoulder blades. °· You develop dizzy episodes or fainting while standing. °· You develop shortness of breath. °· You develop persistent nausea or vomiting. °· You cannot control your bowel functions or lose your appetite. °· You develop diarrhea. °SEEK IMMEDIATE MEDICAL CARE IF:  °· You have a fever. °· You  develop a rash. °· You have difficulty breathing or sharp pains in your chest. °· You develop any reaction or side effects to medicines given. °MAKE SURE YOU: °· Understand these instructions. °· Will watch your condition. °· Will get help right away if you are not doing well or get worse. °Document Released: 12/30/2004 Document Revised: 03/24/2011 Document Reviewed: 07/09/2010 °Exit[employer]® Patient Information ©2015 [employer]. This information is not intended to replace advice given to you by your health care provider. Make sure you discuss any questions you have with your health care provider. °CCS ______[facility] °LAPAROSCOPIC SURGERY: POST OP INSTRUCTIONS °Always review your discharge instruction sheet given to you by the facility where your surgery was performed. °IF YOU HAVE DISABILITY OR FAMILY LEAVE FORMS, YOU MUST BRING THEM TO THE OFFICE FOR PROCESSING.   °DO NOT GIVE THEM TO YOUR DOCTOR. ° °1. A prescription for pain medication may be given to you upon discharge.  Take your pain medication as prescribed, if needed.  If narcotic pain medicine is not needed, then you may take acetaminophen (Tylenol) or ibuprofen (Advil) as needed. °2. Take your usually prescribed medications unless otherwise directed. °3. If you need a refill on your pain medication, please contact your pharmacy.  They will contact our office to request authorization. Prescriptions will not be filled after 5pm or on week-ends. °4. You should follow a light diet the first few days after arrival home, such as soup and crackers, etc.  Be sure to include lots of fluids daily. °5. Most patients will experience some swelling and bruising   in the area of the incisions.  Ice packs will help.  Swelling and bruising can take several days to resolve.  °6. It is common to experience some constipation if taking pain medication after surgery.  Increasing fluid intake and taking a stool softener (such as Colace) will usually help or  prevent this problem from occurring.  A mild laxative (Milk of Magnesia or Miralax) should be taken according to package instructions if there are no bowel movements after 48 hours. °7. Unless discharge instructions indicate otherwise, you may remove your bandages 24-48 hours after surgery, and you may shower at that time.  You may have steri-strips (small skin tapes) in place directly over the incision.  These strips should be left on the skin for 7-10 days.  If your surgeon used skin glue on the incision, you may shower in 24 hours.  The glue will flake off over the next 2-3 weeks.  Any sutures or staples will be removed at the office during your follow-up visit. °8. ACTIVITIES:  You may resume regular (light) daily activities beginning the next day--such as daily self-care, walking, climbing stairs--gradually increasing activities as tolerated.  You may have sexual intercourse when it is comfortable.  Refrain from any heavy lifting or straining until approved by your doctor. °a. You may drive when you are no longer taking prescription pain medication, you can comfortably wear a seatbelt, and you can safely maneuver your car and apply brakes. °b. RETURN TO WORK:  __________________________________________________________ °9. You should see your doctor in the office for a follow-up appointment approximately 2-3 weeks after your surgery.  Make sure that you call for this appointment within a day or two after you arrive home to insure a convenient appointment time. °10. OTHER INSTRUCTIONS: __________________________________________________________________________________________________________________________ __________________________________________________________________________________________________________________________ °WHEN TO CALL YOUR DOCTOR: °1. Fever over 101.0 °2. Inability to urinate °3. Continued bleeding from incision. °4. Increased pain, redness, or drainage from the incision. °5. Increasing  abdominal pain ° °The clinic staff is available to answer your questions during regular business hours.  Please don’t hesitate to call and ask to speak to one of the nurses for clinical concerns.  If you have a medical emergency, go to the nearest emergency room or call 911.  A surgeon from [facility] is always on call at the hospital. °[address], [address], [place], [place]  [address] ? [address] [address], , [place]   [address] °[phone] ? [phone] ? FAX [phone] °Web site: [email or web address] ° °

## 2013-12-30 NOTE — Op Note (Signed)
Appendectomy, Lap, Procedure Note  Pre-operative Diagnosis: Acute appendicitis  Post-operative Diagnosis: Same  Procedure:  Laparoscopic appendectomy  Surgeon:  Jackolyn Confer, M.D.  Anesthesia:  General   Indications:  This is a 28 year old female with acute appendicitis who presents for appendectomy.   Anesthesia: General endotracheal anesthesia  Procedure Details   She was brought to the operating room, placed in the supine position and general anesthesia was induced, along with placement of orogastric tube, SCDs, and a Foley catheter. A timeout was performed. The abdomen was prepped and draped in a sterile fashion. A small infraumbilical incision was made through the skin, subcutaneous tissue, fascia, and peritoneum entering the peritoneal cavity under direct vision.  A pursestring suture was passed around the fascia with a 0 Vicryl.  The Hasson was introduced into the peritoneal cavity and the tails of the suture were used to hold the Hasson in place.   The pneumoperitoneum was then established to steady pressure of 15 mmHg.   The laparoscope was introduced and there was no evidence of bleeding or underlying organ injury. Additional 5 mm cannulas then placed in the left lower quadrant of the abdomen and the right upper quadrant region under direct visualization. A careful evaluation of the entire abdomen was carried out. The patient was placed in Trendelenburg and left lateral decubitus position. The small intestines were retracted in the cephalad and left lateral direction away from the pelvis and right lower quadrant. The patient was found to have an enlarged and inflamed appendix that was extending into the pelvis. There was no evidence of perforation.  The appendix was carefully mobilized. The mesoappendix was was divided with the harmonic scalpel.   The appendix was amputated off the cecum, with a small cuff of cecum, using an endo-GIA stapler.  The appendix was placed in a  retrieval bag and removed through the subumbilical port incision.    There was no evidence of bleeding, leakage, or complication after division of the appendix. Copious irrigation was  performed and irrigant fluid suctioned from the abdomen as much as possible.  The umbilical trocar was removed and the  port site fascia was closed via the purse string suture under laparoscopic vision. There was no residual palpable fascial defect.  The remaining trocars were removed and all  trocar site skin wounds were closed with 4-0 Monocryl.  Steri strips and sterile dressings were applied.  Instrument, sponge, and needle counts were correct at the conclusion of the case.   Findings: The appendix was found to be inflamed. There were not signs of necrosis.  There was not perforation. There was not abscess formation.  Estimated Blood Loss:  less than 50 mL         Drains: none               Specimens: appendix         Complications:  None; patient tolerated the procedure well.         Disposition: PACU - hemodynamically stable.         Condition: stable

## 2013-12-30 NOTE — Progress Notes (Signed)
Should be right upper l lower.  r upper with small stain

## 2013-12-30 NOTE — ED Notes (Signed)
Pt having abdominal pain w/ nausea, denies vomiting or diarrhea, ambulated to restroom w/o difficulty, appears to in no distress.

## 2013-12-30 NOTE — ED Provider Notes (Signed)
CSN: [id number]     Arrival date & time 12/30/13  0340 History   First MD Initiated Contact with Patient 12/30/13 0411     Chief Complaint  Patient presents with  . Abdominal Pain     (Consider location/radiation/quality/duration/timing/severity/associated sxs/prior Treatment) HPI Comments: Patient is a 29 year old female presents emergency room chief complaint of worsening abdominal pain for 2 days. Patient reports initial onset of generalized abdominal pain described as cramping.  She reports increase in discomfort over the right lower quadrant today. No aggravating or relieving factors. Patient reports associated decrease in appetite. She reports nausea without emesis. Patient also reports abnormal vaginal discharge for 6 days. She denies new sexual partners, denies recent antibiotics, denies concern for STI. Denies diarrhea, constipation, hematuria, dysuria. Patient's last menstrual period was 12/17/2013 (exact date).  Patient is a 28 y.o. female presenting with abdominal pain. The history is provided by the patient. No language interpreter was used.  Abdominal Pain Associated symptoms: nausea and vaginal discharge   Associated symptoms: no chills, no constipation, no diarrhea, no dysuria, no fever, no hematuria, no vaginal bleeding and no vomiting     Past Medical History  Diagnosis Date  . Bronchitis    Past Surgical History  Procedure Laterality Date  . Cesarean section     Family History  Problem Relation Age of Onset  . Diabetes Mother   . Hypertension Mother   . Hypertension Other   . Diabetes Other    History  Substance Use Topics  . Smoking status: Never Smoker   . Smokeless tobacco: Not on file  . Alcohol Use: No   OB History    No data available     Review of Systems  Constitutional: Negative for fever and chills.  Gastrointestinal: Positive for nausea and abdominal pain. Negative for vomiting, diarrhea, constipation and blood in stool.  Genitourinary:  Positive for vaginal discharge. Negative for dysuria, urgency, hematuria, vaginal bleeding and vaginal pain.      Allergies  Penicillins  Home Medications   Prior to Admission medications   Medication Sig Start Date End Date Taking? Authorizing Provider  diphenhydrAMINE (BENADRYL) 25 MG tablet Take 1 tablet (25 mg total) by mouth every 6 (six) hours as needed for itching (Rash). 10/06/13   Alvina Chou, PA-C  naproxen (NAPROSYN) 500 MG tablet Take 1 tablet (500 mg total) by mouth 2 (two) times daily with a meal. 08/22/13   Larene Pickett, PA-C  sulfamethoxazole-trimethoprim (SEPTRA DS) 800-160 MG per tablet Take 1 tablet by mouth every 12 (twelve) hours. 10/06/13   Kaitlyn Szekalski, PA-C   BP 123/78 mmHg  Pulse 88  Temp(Src) 97.8 F (36.6 C) (Oral)  Resp 16  SpO2 100%  LMP 12/17/2013 (Exact Date) Physical Exam  Constitutional: She is oriented to person, place, and time. She appears well-developed and well-nourished. No distress.  HENT:  Head: Normocephalic and atraumatic.  Eyes: No scleral icterus.  Neck: Neck supple.  Cardiovascular: Normal rate and regular rhythm.   Pulmonary/Chest: Effort normal. No respiratory distress. She has no wheezes. She exhibits no tenderness.  Abdominal: Soft. Bowel sounds are normal. There is tenderness in the right lower quadrant. There is guarding and tenderness at McBurney's point. There is no rebound and no CVA tenderness.  Genitourinary: Cervix exhibits no motion tenderness and no friability. Right adnexum displays no mass and no tenderness. Left adnexum displays no mass and no tenderness. Vaginal discharge found.  Moderate amount of white discharge in posterior cervical vault. Chaperone present.  Neurological: She is alert and oriented to person, place, and time.  Skin: Skin is warm and dry. She is not diaphoretic.  Psychiatric: She has a normal mood and affect. Her behavior is normal.  Nursing note and vitals reviewed.   ED Course   Procedures (including critical care time) Labs Review Labs Reviewed  WET PREP, GENITAL - Abnormal; Notable for the following:    Trich, Wet Prep FEW (*)    WBC, Wet Prep HPF POC FEW (*)    All other components within normal limits  CBC WITH DIFFERENTIAL - Abnormal; Notable for the following:    WBC 14.6 (*)    Neutro Abs 10.6 (*)    All other components within normal limits  URINALYSIS, ROUTINE W REFLEX MICROSCOPIC - Abnormal; Notable for the following:    Leukocytes, UA MODERATE (*)    All other components within normal limits  URINE MICROSCOPIC-ADD ON - Abnormal; Notable for the following:    Squamous Epithelial / LPF FEW (*)    Bacteria, UA FEW (*)    All other components within normal limits  GC/CHLAMYDIA PROBE AMP  COMPREHENSIVE METABOLIC PANEL  LIPASE, BLOOD  PREGNANCY, URINE    Imaging Review Ct Abdomen Pelvis W Contrast  12/30/2013   CLINICAL DATA:  Abdominal pain and nausea, increasing for 2 days.  EXAM: CT ABDOMEN AND PELVIS WITH CONTRAST  TECHNIQUE: Multidetector CT imaging of the abdomen and pelvis was performed using the standard protocol following bolus administration of intravenous contrast.  CONTRAST:  148mL OMNIPAQUE IOHEXOL 300 MG/ML  SOLN  COMPARISON:  None.  FINDINGS: BODY WALL: Unremarkable.  LOWER CHEST: Unremarkable.  ABDOMEN/PELVIS:  Liver: No focal abnormality.  Biliary: No evidence of biliary obstruction or stone.  Pancreas: Unremarkable.  Spleen: Unremarkable.  Adrenals: Unremarkable.  Kidneys and ureters: No hydronephrosis or stone.  Bladder: Unremarkable.  Reproductive: Unremarkable.  Bowel: Dilated and fluid-filled appendix, measuring up to 14 mm outer wall diameter. There is surrounding fat and peritoneal inflammation with mild edema in the cecum. Reactive ileocolic lymphadenopathy. Although the inflammatory changes are advanced, no discontinuity of the wall or periappendiceal abscess/ fluid collection is identified.  Retroperitoneum: No mass or adenopathy.   Vascular: No acute abnormality.  OSSEOUS: No acute abnormalities.  IMPRESSION: Acute suppurative appendicitis. The inflammation is advanced but no perforation currently.   Electronically Signed   By: Jorje Guild M.D.   On: 12/30/2013 06:47     EKG Interpretation None      MDM   Final diagnoses:  Abdominal discomfort  Acute appendicitis with localized peritonitis  Trichimoniasis   Patient presents with generalized abdominal pain, localizing to right lower quadrant over last several days with nausea and decreased appetite patient is afebrile. History, exam concerning for acute appendicitis. Pelvic exam without discomfort, CMT, wet prep shows Trichomonas and moderate WBC.  6:44 AM Call from radiology confirms acute appendicitis, no perforation. General surgery paged. [phone] Discuss CT results with patient and patient's mother. Pt reports being treated recently for trichimoniasis. General surgery call to be received by Truitt Leep, PA-C at shift change. Antibiotic ordered.   Harvie Heck, PA-C 01/02/14 1607  Everlene Balls, MD 01/04/14 [phone]

## 2013-12-31 LAB — GC/CHLAMYDIA PROBE AMP
CT Probe RNA: NEGATIVE
GC Probe RNA: NEGATIVE

## 2013-12-31 MED ORDER — OXYCODONE HCL 5 MG PO TABS: 5.0000 mg | ORAL_TABLET | ORAL | Status: DC | PRN

## 2013-12-31 MED ORDER — METRONIDAZOLE 500 MG PO TABS
500.0000 mg | ORAL_TABLET | Freq: Three times a day (TID) | ORAL | Status: DC
  Administered 2013-12-31: 500 mg via ORAL
  Filled 2013-12-31 (×3): qty 1

## 2013-12-31 MED ORDER — METRONIDAZOLE 500 MG PO TABS: 500.0000 mg | ORAL_TABLET | Freq: Two times a day (BID) | ORAL | Status: DC

## 2013-12-31 NOTE — Discharge Summary (Signed)
Physician Discharge Summary  Patient ID: JEIMY BICKERT MRN: [id number] DOB/AGE: [birth year] 28 y.o.  Admit date: 12/30/2013 Discharge date: 12/31/2013  Admission Diagnoses: appendicitis   Discharge Diagnoses:  Active Problems:   Acute appendicitis   Discharged Condition: good  Hospital Course: Patient admitted for overnight observation after uncomplicated appendectomy.  Did well overnight.  SHe was discharged to home once able to tolerate PO and oral narcotics.    Consults: None  Significant Diagnostic Studies: labs: cbc, chemistry, CT scan: acute appendicitis  Treatments: IV hydration, antibiotics: metronidazole and surgery: appendectomy  Discharge Exam: Blood pressure 110/75, pulse 68, temperature 98.2 F (36.8 C), temperature source Oral, resp. rate 18, height 4\' 11"  (1.499 m), weight 162 lb (73.483 kg), last menstrual period 12/17/2013, SpO2 100 %. General appearance: alert and cooperative Resp: no resp distress GI: soft, appropriately tender Incision/Wound: dressing clean, dry, intact  Disposition: 01-Home or Self Care     Medication List    STOP taking these medications        sulfamethoxazole-trimethoprim 800-160 MG per tablet  Commonly known as:  SEPTRA DS      TAKE these medications        acetaminophen 500 MG tablet  Commonly known as:  TYLENOL  Take 1,000 mg by mouth every 6 (six) hours as needed for mild pain.     diphenhydrAMINE 25 MG tablet  Commonly known as:  BENADRYL  Take 1 tablet (25 mg total) by mouth every 6 (six) hours as needed for itching (Rash).     metroNIDAZOLE 500 MG tablet  Commonly known as:  FLAGYL  Take 1 tablet (500 mg total) by mouth 2 (two) times daily.     naproxen 500 MG tablet  Commonly known as:  NAPROSYN  Take 1 tablet (500 mg total) by mouth 2 (two) times daily with a meal.     oxyCODONE 5 MG immediate release tablet  Commonly known as:  Oxy IR/ROXICODONE  Take 1-2 tablets (5-10 mg total) by mouth every 4 (four)  hours as needed for moderate pain.           Follow-up Information    Follow up with [facility]. Schedule an appointment as soon as possible for a visit in 3 weeks.   Why:  Call Monday and ask for a follow up appointment with the [facility].  i have ask office to contact you also.   Contact information:   [address] [place]   [place] [address] [phone]       Signed: Rosario Adie [id number], 9:03 AM

## 2013-12-31 NOTE — Progress Notes (Signed)
Utilization Review completed.  

## 2014-01-02 ENCOUNTER — Encounter (HOSPITAL_COMMUNITY): Payer: Self-pay | Admitting: General Surgery

## 2014-03-15 ENCOUNTER — Encounter (HOSPITAL_COMMUNITY): Payer: Self-pay | Admitting: *Deleted

## 2014-03-15 ENCOUNTER — Emergency Department (HOSPITAL_COMMUNITY)
Admission: EM | Admit: 2014-03-15 | Discharge: 2014-03-15 | Disposition: A | Discharge status: Home / Self Care | Payer: BC Managed Care – PPO | Source: Home / Self Care | Attending: Emergency Medicine | Admitting: Emergency Medicine

## 2014-03-15 ENCOUNTER — Emergency Department (INDEPENDENT_AMBULATORY_CARE_PROVIDER_SITE_OTHER): Payer: BC Managed Care – PPO

## 2014-03-15 DIAGNOSIS — R05 Cough: Secondary | ICD-10-CM

## 2014-03-15 DIAGNOSIS — J111 Influenza due to unidentified influenza virus with other respiratory manifestations: Secondary | ICD-10-CM

## 2014-03-15 DIAGNOSIS — R69 Illness, unspecified: Principal | ICD-10-CM

## 2014-03-15 DIAGNOSIS — R059 Cough, unspecified: Secondary | ICD-10-CM

## 2014-03-15 MED ORDER — NAPROXEN 500 MG PO TABS: 500.0000 mg | ORAL_TABLET | Freq: Two times a day (BID) | ORAL | Status: DC

## 2014-03-15 MED ORDER — HYDROCOD POLST-CHLORPHEN POLST 10-8 MG/5ML PO LQCR: 5.0000 mL | Freq: Two times a day (BID) | ORAL | Status: DC | PRN

## 2014-03-15 MED ORDER — IPRATROPIUM BROMIDE 0.06 % NA SOLN: 2.0000 | Freq: Four times a day (QID) | NASAL | Status: DC

## 2014-03-15 NOTE — Discharge Instructions (Signed)

## 2014-03-15 NOTE — ED Notes (Signed)
Pt  Reports  Symptoms  Of  Cough  Congested   As  Well as  A  sorethroat    /  Stuffy nose     With  Onset  Of  Symptoms  Being  Yesterday      Pt  Is  Masked  And  Sitting  Upright on  Exam table

## 2014-03-15 NOTE — ED Provider Notes (Signed)
Chief Complaint   URI   History of Present Illness   Jessica Mcpherson is a 29 year old female who works in a [employer]. She's had a four-day history of fever to 102, chills, nasal congestion with greenish bloody drainage, headache, cough productive green sputum, shortness of breath, sore throat, hoarseness, and nausea.  Review of Systems   Other than as noted above, the patient denies any of the following symptoms: Systemic:  No fevers, chills, sweats, or myalgias. Eye:  No redness or discharge. ENT:  No ear pain, headache, nasal congestion, drainage, sinus pressure, or sore throat. Neck:  No neck pain, stiffness, or swollen glands. Lungs:  No cough, sputum production, hemoptysis, wheezing, chest tightness, shortness of breath or chest pain. GI:  No abdominal pain, nausea, vomiting or diarrhea.  [place]   Past medical history, family history, social history, meds, and allergies were reviewed.   Physical exam   Vital signs:  BP 114/80 mmHg  Pulse 73  Temp(Src) 98.6 F (37 C) (Oral)  Resp 17  SpO2 98%  LMP 02/21/2014 General:  Alert and oriented.  In no distress.  Skin warm and dry. Eye:  No conjunctival injection or drainage. Lids were normal. ENT:  TMs and canals were normal, without erythema or inflammation.  Nasal mucosa was clear and uncongested, without drainage.  Mucous membranes were moist.  Pharynx was clear with no exudate or drainage.  There were no oral ulcerations or lesions. Neck:  Supple, no adenopathy, tenderness or mass. Lungs:  No respiratory distress.  Lungs were clear to auscultation, without wheezes, rales or rhonchi.  Breath sounds were clear and equal bilaterally.  Heart:  Regular rhythm, without gallops, murmers or rubs. Skin:  Clear, warm, and dry, without rash or lesions.    Radiology   Dg Chest 2 View  03/15/2014   CLINICAL DATA:  Chest congestion, fever  EXAM: CHEST  2 VIEW  COMPARISON:  None.  FINDINGS: Cardiomediastinal silhouette  is unremarkable. No acute infiltrate or pleural effusion. No pulmonary edema. Bony thorax is unremarkable.  IMPRESSION: No active cardiopulmonary disease.   Electronically Signed   By: Lahoma Crocker M.D.   On: 03/15/2014 15:02    Assessment     The primary encounter diagnosis was Influenza-like illness. A diagnosis of Cough was also pertinent to this visit.  There is no evidence of pneumonia, strep throat, sinusitis, otitis media.    Plan    1.  Meds:  The following meds were prescribed:   Discharge Medication List as of 03/15/2014  3:16 PM    START taking these medications   Details  chlorpheniramine-HYDROcodone (TUSSIONEX) 10-8 MG/5ML LQCR Take 5 mLs by mouth every 12 (twelve) hours as needed for cough., Starting 03/15/2014, Until Discontinued, Normal    ipratropium (ATROVENT) 0.06 % nasal spray Place 2 sprays into both nostrils 4 (four) times daily., Starting 03/15/2014, Until Discontinued, Normal    !! naproxen (NAPROSYN) 500 MG tablet Take 1 tablet (500 mg total) by mouth 2 (two) times daily., Starting 03/15/2014, Until Discontinued, Normal     !! - Potential duplicate medications found. Please discuss with provider.      2.  Patient Education/Counseling:  The patient was given appropriate handouts, self care instructions, and instructed in symptomatic relief.  Instructed to get extra fluids and extra rest.    3.  Follow up:  The patient was told to follow up here if no better in 3 to 4 days, or sooner if becoming worse in  any way, and given some red flag symptoms such as increasing fever, difficulty breathing, chest pain, or persistent vomiting which would prompt immediate return.       Harden Mo, MD 03/15/14 [phone]

## 2014-09-21 ENCOUNTER — Encounter (HOSPITAL_COMMUNITY): Payer: Self-pay | Admitting: Emergency Medicine

## 2014-09-21 ENCOUNTER — Emergency Department (HOSPITAL_COMMUNITY)
Admission: EM | Admit: 2014-09-21 | Discharge: 2014-09-21 | Disposition: A | Discharge status: Home / Self Care | Payer: BC Managed Care – PPO | Attending: Emergency Medicine | Admitting: Emergency Medicine

## 2014-09-21 DIAGNOSIS — Z880 Allergy status to penicillin: Secondary | ICD-10-CM | POA: Insufficient documentation

## 2014-09-21 DIAGNOSIS — Z720 Tobacco use: Secondary | ICD-10-CM | POA: Insufficient documentation

## 2014-09-21 DIAGNOSIS — Z8709 Personal history of other diseases of the respiratory system: Secondary | ICD-10-CM | POA: Insufficient documentation

## 2014-09-21 DIAGNOSIS — R51 Headache: Secondary | ICD-10-CM | POA: Insufficient documentation

## 2014-09-21 DIAGNOSIS — Z3202 Encounter for pregnancy test, result negative: Secondary | ICD-10-CM | POA: Insufficient documentation

## 2014-09-21 DIAGNOSIS — R519 Headache, unspecified: Secondary | ICD-10-CM

## 2014-09-21 DIAGNOSIS — R42 Dizziness and giddiness: Secondary | ICD-10-CM | POA: Insufficient documentation

## 2014-09-21 LAB — PREGNANCY, URINE: Preg Test, Ur: NEGATIVE

## 2014-09-21 MED ORDER — SODIUM CHLORIDE 0.9 % IV BOLUS (SEPSIS)
1000.0000 mL | Freq: Once | INTRAVENOUS | Status: AC
  Administered 2014-09-21: 1000 mL via INTRAVENOUS

## 2014-09-21 MED ORDER — DIPHENHYDRAMINE HCL 50 MG/ML IJ SOLN
25.0000 mg | Freq: Once | INTRAMUSCULAR | Status: AC
  Administered 2014-09-21: 25 mg via INTRAVENOUS
  Filled 2014-09-21: qty 1

## 2014-09-21 MED ORDER — PROCHLORPERAZINE EDISYLATE 5 MG/ML IJ SOLN
10.0000 mg | Freq: Once | INTRAMUSCULAR | Status: AC
  Administered 2014-09-21: 10 mg via INTRAVENOUS
  Filled 2014-09-21: qty 2

## 2014-09-21 MED ORDER — KETOROLAC TROMETHAMINE 30 MG/ML IJ SOLN
30.0000 mg | Freq: Once | INTRAMUSCULAR | Status: DC
  Filled 2014-09-21: qty 1

## 2014-09-21 NOTE — ED Notes (Signed)
Pt c/o of headaches that will come on around noon and then go away when pt goes to sleep at night.  Pt states that she has sensitivity to light.  Pt states that while at work she will get dizzy.  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

## 2014-09-21 NOTE — ED Provider Notes (Signed)
CSN: [id number]     Arrival date & time 09/21/14  1314 History   First MD Initiated Contact with Patient 09/21/14 1320     Chief Complaint  Patient presents with  . Headache  . Dizziness     (Consider location/radiation/quality/duration/timing/severity/associated sxs/prior Treatment) HPI Comments: 29 year old female with a history of preeclampsia who presents with headache. The patient states that for the past 2 weeks, she has had a gradual onset of headache around noon each day at work. The headache last for most of the day and then resolves at night. The headache is frontal and associated with photophobia and nausea. Currently the pain is moderate in intensity. She states that she sees some flashes of light when the headache is starting. She has had some lightheadedness and dizziness with the pain. No sudden onset of headache or thunderclap headache. No neck stiffness, rash, fevers, vomiting, diarrhea, or abdominal pain. No recent illness. The patient has not taken anything for her headache at.  Patient is a 29 y.o. female presenting with headaches and dizziness. The history is provided by the patient.  Headache Associated symptoms: dizziness   Dizziness Associated symptoms: headaches     Past Medical History  Diagnosis Date  . Bronchitis    Past Surgical History  Procedure Laterality Date  . Cesarean section    . Laparoscopic appendectomy N/A 12/30/2013    Procedure: APPENDECTOMY LAPAROSCOPIC;  Surgeon: Jackolyn Confer, MD;  Location: [facility];  Service: General;  Laterality: N/A;   Family History  Problem Relation Age of Onset  . Diabetes Mother   . Hypertension Mother   . Hypertension Other   . Diabetes Other    Social History  Substance Use Topics  . Smoking status: Current Every Day Smoker    Types: Cigarettes  . Smokeless tobacco: None  . Alcohol Use: No   OB History    No data available     Review of Systems  Neurological: Positive for dizziness and headaches.     10 Systems reviewed and are negative for acute change except as noted in the HPI.   Allergies  Penicillins  Home Medications   Prior to Admission medications   Medication Sig Start Date End Date Taking? Authorizing Provider  acetaminophen (TYLENOL) 500 MG tablet Take 1,000 mg by mouth every 6 (six) hours as needed for mild pain.   Yes Historical Provider, MD  diphenhydrAMINE (BENADRYL) 25 MG tablet Take 1 tablet (25 mg total) by mouth every 6 (six) hours as needed for itching (Rash). Patient not taking: Reported on 12/30/2013 10/06/13   Alvina Chou, PA-C  ipratropium (ATROVENT) 0.06 % nasal spray Place 2 sprays into both nostrils 4 (four) times daily. Patient not taking: Reported on 09/21/2014 03/15/14   Harden Mo, MD  naproxen (NAPROSYN) 500 MG tablet Take 1 tablet (500 mg total) by mouth 2 (two) times daily. Patient not taking: Reported on 09/21/2014 03/15/14   Harden Mo, MD   BP 127/76 mmHg  Pulse 73  Temp(Src) 98.1 F (36.7 C) (Oral)  SpO2 100%  LMP 09/12/2014 Physical Exam  Constitutional: She is oriented to person, place, and time. She appears well-developed and well-nourished. No distress.  Awake, alert  HENT:  Head: Normocephalic and atraumatic.  Eyes: Conjunctivae and EOM are normal. Pupils are equal, round, and reactive to light.  Neck: Neck supple.  Cardiovascular: Normal rate, regular rhythm and normal heart sounds.   No murmur heard. Pulmonary/Chest: Effort normal and breath sounds normal. No respiratory distress.  Abdominal: Soft. Bowel sounds are normal. She exhibits no distension.  Musculoskeletal: She exhibits no edema.  Neurological: She is alert and oriented to person, place, and time. She has normal reflexes. No cranial nerve deficit. She exhibits normal muscle tone.  Fluent speech, normal finger-to-nose testing, negative pronator drift  Skin: Skin is warm and dry.  Psychiatric: She has a normal mood and affect. Judgment and thought content  normal.  Nursing note and vitals reviewed.   ED Course  Procedures (including critical care time) Labs Review Labs Reviewed  PREGNANCY, URINE    Imaging Review No results found. I have personally reviewed and evaluated these  lab results as part of my medical decision-making.   EKG Interpretation None     Medications  ketorolac (TORADOL) 30 MG/ML injection 30 mg (0 mg Intravenous Hold 09/21/14 1455)  sodium chloride 0.9 % bolus 1,000 mL (1,000 mLs Intravenous New Bag/Given 09/21/14 1432)  diphenhydrAMINE (BENADRYL) injection 25 mg (25 mg Intravenous Given 09/21/14 1433)  prochlorperazine (COMPAZINE) injection 10 mg (10 mg Intravenous Given 09/21/14 1433)     MDM   Final diagnoses:  Nonintractable headache, unspecified chronicity pattern, unspecified headache type    29 year old female who presents with 2 weeks of intermittent, gradual onset frontal headache associated with photophobia and lightheadedness. Patient well-appearing with normal vital signs at presentation. Neurologic exam unremarkable. Gave the patient Benadryl and Compazine as well as an IVF bolus.  On reexamination after receiving medications, the patient reports that she feels better and her headache has resolved. I have instructed her to follow-up at [facility] for blood pressure recheck.  The patient denies any neurologic symptoms such as visual changes, focal numbness/weakness, balance problems, confusion, or speech difficulty to suggest a life-threatening intracranial process such as intracranial hemorrhage or mass. The patient has no clotting risk factors thus venous sinus thrombosis is unlikely.  I feel that the patient is safe for discharge home without any head imaging at this time. I have reviewed return precautions including development of neurologic symptoms, confusion, lethargy, or difficulty speaking and patient has voiced understanding. Pt discharged in satisfactory condition.    Sharlett Iles, MD 09/21/14 [phone]

## 2014-10-31 ENCOUNTER — Encounter (HOSPITAL_COMMUNITY): Payer: Self-pay | Admitting: Emergency Medicine

## 2014-10-31 ENCOUNTER — Emergency Department (HOSPITAL_COMMUNITY)
Admission: EM | Admit: 2014-10-31 | Discharge: 2014-10-31 | Disposition: A | Discharge status: Home / Self Care | Payer: BC Managed Care – PPO | Attending: Emergency Medicine | Admitting: Emergency Medicine

## 2014-10-31 ENCOUNTER — Emergency Department (HOSPITAL_COMMUNITY): Payer: BC Managed Care – PPO

## 2014-10-31 DIAGNOSIS — Z3202 Encounter for pregnancy test, result negative: Secondary | ICD-10-CM | POA: Insufficient documentation

## 2014-10-31 DIAGNOSIS — Z8709 Personal history of other diseases of the respiratory system: Secondary | ICD-10-CM | POA: Insufficient documentation

## 2014-10-31 DIAGNOSIS — M545 Low back pain: Secondary | ICD-10-CM | POA: Insufficient documentation

## 2014-10-31 DIAGNOSIS — R1031 Right lower quadrant pain: Secondary | ICD-10-CM

## 2014-10-31 DIAGNOSIS — Z720 Tobacco use: Secondary | ICD-10-CM | POA: Insufficient documentation

## 2014-10-31 DIAGNOSIS — Z880 Allergy status to penicillin: Secondary | ICD-10-CM | POA: Insufficient documentation

## 2014-10-31 DIAGNOSIS — Z9049 Acquired absence of other specified parts of digestive tract: Secondary | ICD-10-CM | POA: Insufficient documentation

## 2014-10-31 LAB — COMPREHENSIVE METABOLIC PANEL
ALT: 37 U/L (ref 14–54)
AST: 32 U/L (ref 15–41)
Albumin: 4 g/dL (ref 3.5–5.0)
Alkaline Phosphatase: 82 U/L (ref 38–126)
Anion gap: 7 (ref 5–15)
BUN: 10 mg/dL (ref 6–20)
CO2: 23 mmol/L (ref 22–32)
Calcium: 8.9 mg/dL (ref 8.9–10.3)
Chloride: 107 mmol/L (ref 101–111)
Creatinine, Ser: 0.71 mg/dL (ref 0.44–1.00)
GFR calc Af Amer: >60 mL/min (ref >60)
GFR calc non Af Amer: >60 mL/min (ref >60)
Glucose, Bld: 105 mg/dL — ABNORMAL HIGH (ref 65–99)
Potassium: 4 mmol/L (ref 3.5–5.1)
Sodium: 137 mmol/L (ref 135–145)
Total Bilirubin: 0.4 mg/dL (ref 0.3–1.2)
Total Protein: 7.9 g/dL (ref 6.5–8.1)

## 2014-10-31 LAB — URINALYSIS, ROUTINE W REFLEX MICROSCOPIC
Bilirubin Urine: NEGATIVE
Glucose, UA: NEGATIVE mg/dL
Hgb urine dipstick: NEGATIVE
Ketones, ur: NEGATIVE mg/dL
Leukocytes, UA: NEGATIVE
Nitrite: NEGATIVE
Protein, ur: NEGATIVE mg/dL
Specific Gravity, Urine: 1.025 (ref 1.005–1.030)
Urobilinogen, UA: 0.2 mg/dL (ref 0.0–1.0)
pH: 6 (ref 5.0–8.0)

## 2014-10-31 LAB — CBC WITH DIFFERENTIAL/PLATELET
Basophils Absolute: 0 10*3/uL (ref 0.0–0.1)
Basophils Relative: 0 %
Eosinophils Absolute: 0.1 10*3/uL (ref 0.0–0.7)
Eosinophils Relative: 1 %
HCT: 40.3 % (ref 36.0–46.0)
Hemoglobin: 13.5 g/dL (ref 12.0–15.0)
Lymphocytes Relative: 30 %
Lymphs Abs: 2.7 10*3/uL (ref 0.7–4.0)
MCH: 29.7 pg (ref 26.0–34.0)
MCHC: 33.5 g/dL (ref 30.0–36.0)
MCV: 88.6 fL (ref 78.0–100.0)
Monocytes Absolute: 0.7 10*3/uL (ref 0.1–1.0)
Monocytes Relative: 8 %
Neutro Abs: 5.4 10*3/uL (ref 1.7–7.7)
Neutrophils Relative %: 61 %
Platelets: 274 10*3/uL (ref 150–400)
RBC: 4.55 MIL/uL (ref 3.87–5.11)
RDW: 13.3 % (ref 11.5–15.5)
WBC: 8.9 10*3/uL (ref 4.0–10.5)

## 2014-10-31 LAB — LIPASE, BLOOD: Lipase: 18 U/L — ABNORMAL LOW (ref 22–51)

## 2014-10-31 LAB — POC URINE PREG, ED: Preg Test, Ur: NEGATIVE

## 2014-10-31 NOTE — Discharge Instructions (Signed)

## 2014-10-31 NOTE — ED Notes (Signed)
Discharge instructions/follow up care reviewed with patient.  Patient verbalized understanding

## 2014-10-31 NOTE — ED Notes (Signed)
Per pt, states she has not gotten period yet but it feels like she is about to start-right flank pain which started yesterday-no dysuria

## 2014-10-31 NOTE — ED Notes (Signed)
Pt returned from ultrasound

## 2014-10-31 NOTE — ED Provider Notes (Signed)
CSN: [id number]     Arrival date & time 10/31/14  1032 History   First MD Initiated Contact with Patient 10/31/14 1132     Chief Complaint  Patient presents with  . Flank Pain     (Consider location/radiation/quality/duration/timing/severity/associated sxs/prior Treatment) HPI Comments: 29 year old female with history of appendectomy who presents with abdominal pain. Patient states that yesterday she began having intermittent, mild right lower quadrant abdominal pain as well as constant, bilateral low back pain. She states that she feels like she is about to start her period but is not having any vaginal bleeding or abnormal discharge. Last menstrual period was in August. She endorses associated nausea but no vomiting, diarrhea, fevers, or blood in her stool. No dysuria or hematuria.  Patient is a 29 y.o. female presenting with flank pain. The history is provided by the patient.  Flank Pain    Past Medical History  Diagnosis Date  . Bronchitis    Past Surgical History  Procedure Laterality Date  . Cesarean section    . Laparoscopic appendectomy N/A 12/30/2013    Procedure: APPENDECTOMY LAPAROSCOPIC;  Surgeon: Jackolyn Confer, MD;  Location: [facility];  Service: General;  Laterality: N/A;   Family History  Problem Relation Age of Onset  . Diabetes Mother   . Hypertension Mother   . Hypertension Other   . Diabetes Other    Social History  Substance Use Topics  . Smoking status: Current Every Day Smoker    Types: Cigarettes  . Smokeless tobacco: None  . Alcohol Use: No   OB History    No data available     Review of Systems  Genitourinary: Positive for flank pain.    10 Systems reviewed and are negative for acute change except as noted in the HPI.   Allergies  Penicillins and Pepperoni  Home Medications   Prior to Admission medications   Medication Sig Start Date End Date Taking? Authorizing Provider  acetaminophen (TYLENOL) 500 MG tablet Take 1,000 mg by mouth  every 6 (six) hours as needed for mild pain.   Yes Historical Provider, MD  diphenhydrAMINE (BENADRYL) 25 MG tablet Take 1 tablet (25 mg total) by mouth every 6 (six) hours as needed for itching (Rash). Patient not taking: Reported on 12/30/2013 10/06/13   Alvina Chou, PA-C  ipratropium (ATROVENT) 0.06 % nasal spray Place 2 sprays into both nostrils 4 (four) times daily. Patient not taking: Reported on 09/21/2014 03/15/14   Harden Mo, MD  naproxen (NAPROSYN) 500 MG tablet Take 1 tablet (500 mg total) by mouth 2 (two) times daily. Patient not taking: Reported on 09/21/2014 03/15/14   Harden Mo, MD   BP 118/73 mmHg  Pulse 74  Temp(Src) 98.1 F (36.7 C) (Oral)  Resp 16  Ht 5' (1.524 m)  Wt 175 lb (79.379 kg)  BMI 34.18 kg/m2  SpO2 98%  LMP 08/25/2014 Physical Exam  Constitutional: She is oriented to person, place, and time. She appears well-developed and well-nourished. No distress.  HENT:  Head: Normocephalic and atraumatic.  Mouth/Throat: Oropharynx is clear and moist.  Moist mucous membranes  Eyes: Conjunctivae are normal. Pupils are equal, round, and reactive to light.  Neck: Neck supple.  Cardiovascular: Normal rate, regular rhythm and normal heart sounds.   No murmur heard. Pulmonary/Chest: Effort normal and breath sounds normal.  Abdominal: Soft. Bowel sounds are normal. She exhibits no distension.  Mild tenderness of the right lower quadrant to deep palpation with no rebound or guarding  Musculoskeletal:  She exhibits no edema.  Neurological: She is alert and oriented to person, place, and time.  Fluent speech  Skin: Skin is warm and dry.  Psychiatric: She has a normal mood and affect. Judgment normal.  Nursing note and vitals reviewed.   ED Course  Procedures (including critical care time) Labs Review Labs Reviewed  COMPREHENSIVE METABOLIC PANEL - Abnormal; Notable for the following:    Glucose, Bld 105 (*)    All other components within normal limits   LIPASE, BLOOD - Abnormal; Notable for the following:    Lipase 18 (*)    All other components within normal limits  URINALYSIS, ROUTINE W REFLEX MICROSCOPIC (NOT AT [facility])  CBC WITH DIFFERENTIAL/PLATELET  POC URINE PREG, ED    Imaging Review US Transvaginal Non-ob  10/31/2014  CLINICAL DATA:  RIGHT lower quadrant pain history appendectomy EXAM: TRANSABDOMINAL AND TRANSVAGINAL ULTRASOUND OF PELVIS TECHNIQUE: Both transabdominal and transvaginal ultrasound examinations of the pelvis were performed. Transabdominal technique was performed for global imaging of the pelvis including uterus, ovaries, adnexal regions, and pelvic cul-de-sac. It was necessary to proceed with endovaginal exam following the transabdominal exam to visualize the endometrium. COMPARISON:  CT abdomen pelvis 12/30/2013 FINDINGS: Uterus Measurements: 9.5 x 3.8 x 5.4 cm. Normal morphology without mass. Endometrium Thickness: 11 mm thick, normal. No endometrial fluid or focal abnormality. Incidentally noted nabothian cysts along endocervical canal. Right ovary Measurements: 3.5 x 2.1 x 2.0 cm. Normal morphology without mass. Internal blood flow present within RIGHT ovary on color Doppler imaging. Left ovary Measurements: 3.4 x 1.9 x 2.2 cm. Normal morphology without mass. Internal blood flow present within LEFT ovary on color Doppler imaging. Other findings Trace free pelvic fluid, potentially physiologic. No adnexal masses. IMPRESSION: Normal exam. Electronically Signed   By: Lavonia Dana M.D.   On: 10/31/2014 14:35   US Pelvis Complete  10/31/2014  CLINICAL DATA:  RIGHT lower quadrant pain history appendectomy EXAM: TRANSABDOMINAL AND TRANSVAGINAL ULTRASOUND OF PELVIS TECHNIQUE: Both transabdominal and transvaginal ultrasound examinations of the pelvis were performed. Transabdominal technique was performed for global imaging of the pelvis including uterus, ovaries, adnexal regions, and pelvic cul-de-sac. It was necessary to proceed  with endovaginal exam following the transabdominal exam to visualize the endometrium. COMPARISON:  CT abdomen pelvis 12/30/2013 FINDINGS: Uterus Measurements: 9.5 x 3.8 x 5.4 cm. Normal morphology without mass. Endometrium Thickness: 11 mm thick, normal. No endometrial fluid or focal abnormality. Incidentally noted nabothian cysts along endocervical canal. Right ovary Measurements: 3.5 x 2.1 x 2.0 cm. Normal morphology without mass. Internal blood flow present within RIGHT ovary on color Doppler imaging. Left ovary Measurements: 3.4 x 1.9 x 2.2 cm. Normal morphology without mass. Internal blood flow present within LEFT ovary on color Doppler imaging. Other findings Trace free pelvic fluid, potentially physiologic. No adnexal masses. IMPRESSION: Normal exam. Electronically Signed   By: Lavonia Dana M.D.   On: 10/31/2014 14:35   I have personally reviewed and evaluated these lab results as part of my medical decision-making.   EKG Interpretation None      MDM   Final diagnoses:  RLQ abdominal pain   29 year old female who presents with 1 day of intermittent right lower quadrant pain associated with low back pain. Patient well-appearing with normal vital signs. No peritonitis on exam, mild RLQ tenderness w/ deep palpation. Obtained above labs as well as pelvic US to r/o ovarian pathology such as cyst, mass, or torsion.  Labs were unremarkable and showed negative pregnancy test and normal UA.  Ultrasound showed normal-appearing ovaries with no abnormalities to explain the patient's pain. Patient continues to deny any vaginal discharge and the pain is mild and intermittent, therefore PID is very unlikely. Patient has early had appendectomy. On reexamination, patient remains well-appearing. Discussed supportive care instructions and follow-up with OB/GYN regarding her irregular periods. Patient voiced understanding of return precautions and was discharged in satisfactory condition.  Sharlett Iles,  MD 10/31/14 [phone]

## 2014-10-31 NOTE — ED Notes (Signed)
Patient transported to Ultrasound 

## 2014-11-22 ENCOUNTER — Encounter: Payer: BC Managed Care – PPO | Admitting: Obstetrics & Gynecology

## 2015-05-03 ENCOUNTER — Ambulatory Visit: Payer: BC Managed Care – PPO | Admitting: Obstetrics & Gynecology

## 2015-05-23 ENCOUNTER — Encounter: Payer: Self-pay | Admitting: Obstetrics & Gynecology

## 2015-05-23 ENCOUNTER — Ambulatory Visit (INDEPENDENT_AMBULATORY_CARE_PROVIDER_SITE_OTHER): Payer: Medicaid Other | Admitting: Obstetrics & Gynecology

## 2015-05-23 VITALS — BP 134/75 | HR 108 | Ht 60.0 in | Wt 186.6 lb

## 2015-05-23 DIAGNOSIS — N911 Secondary amenorrhea: Secondary | ICD-10-CM

## 2015-05-23 DIAGNOSIS — Z716 Tobacco abuse counseling: Secondary | ICD-10-CM | POA: Diagnosis not present

## 2015-05-23 DIAGNOSIS — E638 Other specified nutritional deficiencies: Secondary | ICD-10-CM | POA: Diagnosis not present

## 2015-05-23 NOTE — Progress Notes (Signed)
Patient ID: Jessica Mcpherson, female   DOB: November 24, 1985, 30 y.o.   MRN: [id number] History:  30 y.o. [id number] LMP: 04/29/2015 here today for eval of irreg cycles. Pt reports that in Oct she had no menses so she went to the ED.  She did not have menest in Mar and was heavier with clots.  Pt reports that she has been trying to get pregnancy for 2 years without success.  Pt missed 2 periods in all. Pt with h/o GC, and chlamydia and trich.  Pt drinks caffeine.  Lots of soda.  Is worried about weight.  Social: pt smokes.  Has not smoked for 3 days.  The following portions of the patient's history were reviewed and updated as appropriate: allergies, current medications, past family history, past medical history, past social history, past surgical history and problem list.  Review of Systems:  Pertinent items are noted in HPI.  Objective:  Physical Exam Blood pressure 134/75, pulse 108, height 5' (1.524 m), weight 186 lb 9.6 oz (84.641 kg), last menstrual period 04/29/2015. Gen: NAD Exam deferred  Labs and Imaging 10/31/2014 CLINICAL DATA: RIGHT lower quadrant pain history appendectomy  EXAM: TRANSABDOMINAL AND TRANSVAGINAL ULTRASOUND OF PELVIS  TECHNIQUE: Both transabdominal and transvaginal ultrasound examinations of the pelvis were performed. Transabdominal technique was performed for global imaging of the pelvis including uterus, ovaries, adnexal regions, and pelvic cul-de-sac. It was necessary to proceed with endovaginal exam following the transabdominal exam to visualize the endometrium.  COMPARISON: CT abdomen pelvis 12/30/2013  FINDINGS: Uterus  Measurements: 9.5 x 3.8 x 5.4 cm. Normal morphology without mass.  Endometrium  Thickness: 11 mm thick, normal. No endometrial fluid or focal abnormality. Incidentally noted nabothian cysts along endocervical canal.  Right ovary  Measurements: 3.5 x 2.1 x 2.0 cm. Normal morphology without mass. Internal blood flow present  within RIGHT ovary on color Doppler imaging.  Left ovary  Measurements: 3.4 x 1.9 x 2.2 cm. Normal morphology without mass. Internal blood flow present within LEFT ovary on color Doppler imaging.  Other findings  Trace free pelvic fluid, potentially physiologic. No adnexal masses.  IMPRESSION: Normal exam.   Assessment & Plan:  Irreg menses: 2 missed cycles in 1 year Tobacco abuse Weight management  Reassurance given Lab: TSH F/u in 6 months with menstrual calendar  Encouraged Tobacco cessation Rec limit caffeine.  Rec decrease in soda/sugar. Exercise- 5/6x/week  20 min in face to face discussion with pt on above items.   Tuere Nwosu L. Harraway-Smith, M.D., Cherlynn June

## 2015-05-23 NOTE — Patient Instructions (Signed)
Tobacco Use Disorder Tobacco use disorder (TUD) is a mental disorder. It is the long-term use of tobacco in spite of related health problems or difficulty with normal life activities. Tobacco is most commonly smoked as cigarettes and less commonly as cigars or pipes. Smokeless chewing tobacco and snuff are also popular. People with TUD get a feeling of extreme pleasure (euphoria) from using tobacco and have a desire to use it again and again. Repeated use of tobacco can cause problems. The addictive effects of tobacco are due mainly tothe ingredient nicotine. Nicotine also causes a rush of adrenaline (epinephrine) in the body. This leads to increased blood pressure, heart rate, and breathing rate. These changes may cause problems for people with high blood pressure, weak hearts, or lung disease. High doses of nicotine in children and pets can lead to seizures and death.  Tobacco contains a number of other unsafe chemicals. These chemicals are especially harmful when inhaled as smoke and can damage almost every organ in the body. Smokers live shorter lives than nonsmokers and are at risk of dying from a number of diseases and cancers. Tobacco smoke can also cause health problems for nonsmokers (due to inhaling secondhand smoke). Smoking is also a fire hazard.  TUD usually starts in the late teenage years and is most common in young adults between the ages of 18 and 25 years. People who start smoking earlier in life are more likely to continue smoking as adults. TUD is somewhat more common in men than women. People with TUD are at higher risk for using alcohol and other drugs of abuse. RISK FACTORS Risk factors for TUD include:   Having family members with the disorder.  Being around people who use tobacco.  Having an existing mental health issue such as schizophrenia, depression, bipolar disorder, ADHD, or posttraumatic stress disorder (PTSD). SIGNS AND SYMPTOMS  People with tobacco use disorder have  two or more of the following signs and symptoms within 12 months:   Use of more tobacco over a longer period than intended.   Not able to cut down or control tobacco use.   A lot of time spent obtaining or using tobacco.   Strong desire or urge to use tobacco (craving). Cravings may last for 6 months or longer after quitting.  Use of tobacco even when use leads to major problems at work, school, or home.   Use of tobacco even when use leads to relationship problems.   Giving up or cutting down on important life activities because of tobacco use.   Repeatedly using tobacco in situations where it puts you or others in physical danger, like smoking in bed.   Use of tobacco even when it is known that a physical or mental problem is likely related to tobacco use.   Physical problems are numerous and may include chronic bronchitis, emphysema, lung and other cancers, gum disease, high blood pressure, heart disease, and stroke.   Mental problems caused by tobacco may include difficulty sleeping and anxiety.  Need to use greater amounts of tobacco to get the same effect. This means you have developed a tolerance.   Withdrawal symptoms as a result of stopping or rapidly cutting back use. These symptoms may last a month or more after quitting and include the following:   Depressed, anxious, or irritable mood.   Difficulty concentrating.   Increased appetite.  Restlessness or trouble sleeping.   Use of tobacco to avoid withdrawal symptoms. DIAGNOSIS  Tobacco use disorder is diagnosed by   your health care provider. A diagnosis may be made by:  Your health care provider asking questions about your tobacco use and any problems it may be causing.  A physical exam.  Lab tests.  You may be referred to a mental health professional or addiction specialist. The severity of tobacco use disorder depends on the number of signs and symptoms you have:   Mild--Two or three  symptoms.  Moderate--Four or five symptoms.   Severe--Six or more symptoms.  TREATMENT  Many people with tobacco use disorder are unable to quit on their own and need help. Treatment options include the following:  Nicotine replacement therapy (NRT). NRT provides nicotine without the other harmful chemicals in tobacco. NRT gradually lowers the dosage of nicotine in the body and reduces withdrawal symptoms. NRT is available in over-the-counter forms (gum, lozenges, and skin patches) as well as prescription forms (mouth inhaler and nasal spray).  Medicines.This may include:  Antidepressant medicine that may reduce nicotine cravings.  A medicine that acts on nicotine receptors in the brain to reduce cravings and withdrawal symptoms. It may also block the effects of tobacco in people with TUD who relapse.  Counseling or talk therapy. A form of talk therapy called behavioral therapy is commonly used to treat people with TUD. Behavioral therapy looks at triggers for tobacco use, how to avoid them, and how to cope with cravings. It is most effective in person or by phone but is also available in self-help forms (books and Internet websites).  Support groups. These provide emotional support, advice, and guidance for quitting tobacco. The most effective treatment for TUD is usually a combination of medicine, talk therapy, and support groups. HOME CARE INSTRUCTIONS  Keep all follow-up visits as directed by your health care provider. This is important.  Take medicines only as directed by your health care provider.  Check with your health care provider before starting new prescription or over-the-counter medicines. SEEK MEDICAL CARE IF:  You are not able to take your medicines as prescribed.  Treatment is not helping your TUD and your symptoms get worse. SEEK IMMEDIATE MEDICAL CARE IF:  You have serious thoughts about hurting yourself or others.  You have trouble breathing, chest pain,  sudden weakness, or sudden numbness in part of your body.   This information is not intended to replace advice given to you by your health care provider. Make sure you discuss any questions you have with your health care provider.   Document Released: 09/05/2003 Document Revised: 01/20/2014 Document Reviewed: 02/25/2013 Elsevier Interactive Patient Education 2016 [employer]. Secondary Amenorrhea Secondary amenorrhea is the stopping of menstrual flow for 3-6 months in a female who has previously had periods. There are many possible causes. Most of these causes are not serious. Usually, treating the underlying problem causing the loss of menses will return your periods to normal. CAUSES  Some common and uncommon causes of not menstruating include:  Malnutrition.  Low blood sugar (hypoglycemia).  Polycystic ovary disease.  Stress or fear.  Breastfeeding.  Hormone imbalance.  Ovarian failure.  Medicines.  Extreme obesity.  Cystic fibrosis.  Low body weight or drastic weight reduction from any cause.  Early menopause.  Removal of ovaries or uterus.  Contraceptives.  Illness.  Long-term (chronic) illnesses.  Cushing syndrome.  Thyroid problems.  Birth control pills, patches, or vaginal rings for birth control. RISK FACTORS You may be at greater risk of secondary amenorrhea if:  You have a family history of this condition.  You have  an eating disorder.  You do athletic training. DIAGNOSIS  A diagnosis is made by your health care provider taking a medical history and doing a physical exam. This will include a pelvic exam to check for problems with your reproductive organs. Pregnancy must be ruled out. Often, numerous blood tests are done to measure different hormones in the body. Urine testing may be done. Specialized exams (ultrasound, CT scan, MRI, or hysteroscopy) may have to be done as well as measuring the body mass index (BMI). TREATMENT  Treatment depends  on the cause of the amenorrhea. If an eating disorder is present, this can be treated with an adequate diet and therapy. Chronic illnesses may improve with treatment of the illness. Amenorrhea may be corrected with medicines, lifestyle changes, or surgery. If the amenorrhea cannot be corrected, it is sometimes possible to create a false menstruation with medicines. HOME CARE INSTRUCTIONS  Maintain a healthy diet.  Manage weight problems.  Exercise regularly but not excessively.  Get adequate sleep.  Manage stress.  Be aware of changes in your menstrual cycle. Keep a record of when your periods occur. Note the date your period starts, how long it lasts, and any problems. SEEK MEDICAL CARE IF: Your symptoms do not get better with treatment.   This information is not intended to replace advice given to you by your health care provider. Make sure you discuss any questions you have with your health care provider.   Document Released: 02/10/2006 Document Revised: 01/20/2014 Document Reviewed: 06/17/2012 Elsevier Interactive Patient Education [employer].

## 2015-05-24 ENCOUNTER — Telehealth: Payer: Self-pay

## 2015-05-24 ENCOUNTER — Encounter: Payer: Self-pay | Admitting: Obstetrics & Gynecology

## 2015-05-24 LAB — TSH: TSH: 0.45 mIU/L

## 2015-05-24 NOTE — Telephone Encounter (Signed)
Please call pt. Her TSH is WNL. No further tx needed at present. I attempted to call patient but there was no answer or voicemail to leave a message.

## 2015-05-28 NOTE — Telephone Encounter (Signed)
Pt has been notified of lab results  

## 2015-06-14 ENCOUNTER — Encounter: Payer: Self-pay | Admitting: Obstetrics

## 2015-06-14 ENCOUNTER — Ambulatory Visit (INDEPENDENT_AMBULATORY_CARE_PROVIDER_SITE_OTHER): Payer: Medicaid Other | Admitting: Obstetrics

## 2015-06-14 VITALS — BP 127/83 | HR 92 | Temp 98.7°F | Wt 185.0 lb

## 2015-06-14 DIAGNOSIS — Z01419 Encounter for gynecological examination (general) (routine) without abnormal findings: Secondary | ICD-10-CM

## 2015-06-14 DIAGNOSIS — N926 Irregular menstruation, unspecified: Secondary | ICD-10-CM

## 2015-06-14 DIAGNOSIS — N839 Noninflammatory disorder of ovary, fallopian tube and broad ligament, unspecified: Secondary | ICD-10-CM

## 2015-06-14 DIAGNOSIS — Z0000 Encounter for general adult medical examination without abnormal findings: Secondary | ICD-10-CM

## 2015-06-14 DIAGNOSIS — Z1389 Encounter for screening for other disorder: Secondary | ICD-10-CM | POA: Diagnosis not present

## 2015-06-14 DIAGNOSIS — Z3202 Encounter for pregnancy test, result negative: Secondary | ICD-10-CM | POA: Diagnosis not present

## 2015-06-14 LAB — POCT URINALYSIS DIPSTICK
Bilirubin, UA: NEGATIVE
Blood, UA: NEGATIVE
Glucose, UA: NEGATIVE
Ketones, UA: NEGATIVE
Leukocytes, UA: NEGATIVE
Nitrite, UA: NEGATIVE
Protein, UA: NEGATIVE
Spec Grav, UA: 1.015
Urobilinogen, UA: NEGATIVE
pH, UA: 6

## 2015-06-14 LAB — POCT URINE PREGNANCY: Preg Test, Ur: NEGATIVE

## 2015-06-14 NOTE — Progress Notes (Signed)
Subjective:        Jessica Mcpherson is a 30 y.o. female here for a routine exam.  Current complaints: Irregular menstrual cycles.  Trying to get pregnant for 2 years without success.  Has used some ovulation kits and is not ovulating.  Does have a 78 year old child.  Personal health questionnaire:  Is patient Ashkenazi Jewish, have a family history of breast and/or ovarian cancer: no Is there a family history of uterine cancer diagnosed at age < 7, gastrointestinal cancer, urinary tract cancer, family member who is a Field seismologist syndrome-associated carrier: no Is the patient overweight and hypertensive, family history of diabetes, personal history of gestational diabetes, preeclampsia or PCOS: no Is patient over 7, have PCOS,  family history of premature CHD under age 49, diabetes, smoke, have hypertension or peripheral artery disease:  no At any time, has a partner hit, kicked or otherwise hurt or frightened you?: no Over the past 2 weeks, have you felt down, depressed or hopeless?: no Over the past 2 weeks, have you felt little interest or pleasure in doing things?:no   Gynecologic History Patient's last menstrual period was 04/29/2015 (approximate). Contraception: none Last Pap: > 2 years. Results were: normal Last mammogram: n/a. Results were: n/a  Obstetric History OB History  No data available    Past Medical History  Diagnosis Date  . Bronchitis     Past Surgical History  Procedure Laterality Date  . Cesarean section    . Laparoscopic appendectomy N/A 12/30/2013    Procedure: APPENDECTOMY LAPAROSCOPIC;  Surgeon: Jackolyn Confer, MD;  Location: [facility];  Service: General;  Laterality: N/A;     Current outpatient prescriptions:  .  acetaminophen (TYLENOL) 500 MG tablet, Take 1,000 mg by mouth every 6 (six) hours as needed for mild pain. Reported on 05/23/2015, Disp: , Rfl:  .  diphenhydrAMINE (BENADRYL) 25 MG tablet, Take 1 tablet (25 mg total) by mouth every 6 (six) hours as  needed for itching (Rash)., Disp: 30 tablet, Rfl: 0 .  ipratropium (ATROVENT) 0.06 % nasal spray, Place 2 sprays into both nostrils 4 (four) times daily., Disp: 15 mL, Rfl: 12 Allergies  Allergen Reactions  . Penicillins Hives    Has patient had a PCN reaction causing immediate rash, facial/tongue/throat swelling, SOB or lightheadedness with hypotension:  Has patient had a PCN reaction causing severe rash involving mucus membranes or skin necrosis:  Has patient had a PCN reaction that required hospitalization  Has patient had a PCN reaction occurring within the last 10 years:  If all of the above answers are "NO", then may proceed with Cephalosporin use.   Marland Kitchen Pepperoni [Pickled Meat] Hives    Social History  Substance Use Topics  . Smoking status: Current Every Day Smoker    Types: Cigarettes    Last Attempt to Quit: 05/20/2015  . Smokeless tobacco: Not on file     Comment: has not smoked for 3 days  . Alcohol Use: No    Family History  Problem Relation Age of Onset  . Diabetes Mother   . Hypertension Mother   . Diabetes Maternal Grandmother   . Hypertension Maternal Grandmother   . Diabetes Maternal Grandfather       Review of Systems  Constitutional: negative for fatigue and weight loss Respiratory: negative for cough and wheezing Cardiovascular: negative for chest pain, fatigue and palpitations Gastrointestinal: negative for abdominal pain and change in bowel habits Musculoskeletal:negative for myalgias Neurological: negative for gait problems and tremors  Behavioral/Psych: negative for abusive relationship, depression Endocrine: negative for temperature intolerance   Genitourinary:negative for abnormal menstrual periods, genital lesions, hot flashes, sexual problems and vaginal discharge Integument/breast: negative for breast lump, breast tenderness, nipple discharge and skin lesion(s)    Objective:       BP 127/83 mmHg  Pulse 92  Temp(Src) 98.7 F (37.1 C)  Wt  185 lb (83.915 kg)  LMP 04/29/2015 (Approximate) General:   alert  Skin:   no rash or abnormalities  Lungs:   clear to auscultation bilaterally  Heart:   regular rate and rhythm, S1, S2 normal, no murmur, click, rub or gallop  Breasts:   normal without suspicious masses, skin or nipple changes or axillary nodes  Abdomen:  normal findings: no organomegaly, soft, non-tender and no hernia  Pelvis:  External genitalia: normal general appearance Urinary system: urethral meatus normal and bladder without fullness, nontender Vaginal: normal without tenderness, induration or masses Cervix: normal appearance Adnexa: normal bimanual exam Uterus: anteverted and non-tender, normal size   Lab Review Urine pregnancy test Labs reviewed yes Radiologic studies reviewed yes   Assessment:    Healthy female exam.    Ovulation dysfunction   Plan:    F/U in 2 weeks with abstinence to start Clomid Ovulation Induction    Education reviewed: calcium supplements, depression evaluation, low fat, low cholesterol diet, safe sex/STD prevention, self breast exams, smoking cessation and weight bearing exercise. Follow up in: 2 weeks.   No orders of the defined types were placed in this encounter.   Orders Placed This Encounter  Procedures  . POCT urinalysis dipstick  . POCT urine pregnancy

## 2015-06-14 NOTE — Patient Instructions (Signed)
Clomiphene tablets What is this medicine? CLOMIPHENE (KLOE mi feen) is a fertility drug that increases the chance of pregnancy. It helps women ovulate (produce a mature egg) during their cycle. This medicine may be used for other purposes; ask your health care provider or pharmacist if you have questions. What should I tell my health care provider before I take this medicine? They need to know if you have any of these conditions: -adrenal gland disease -blood vessel disease or blood clots -cyst on the ovary -endometriosis -liver disease -ovarian cancer -pituitary gland disease -vaginal bleeding that has not been evaluated -an unusual or allergic reaction to clomiphene, other medicines, foods, dyes, or preservatives -pregnant (should not be used if you are already pregnant) -breast-feeding How should I use this medicine? Take this medicine by mouth with a glass of water. Follow the directions on the prescription label. Take exactly as directed for the exact number of days prescribed. Take your doses at regular intervals. Most women take this medicine for a 5 day period, but the length of treatment may be adjusted. Your doctor will give you a start date for this medication and will give you instructions on proper use. Do not take your medicine more often than directed. Talk to your pediatrician regarding the use of this medicine in children. Special care may be needed. Overdosage: If you think you have taken too much of this medicine contact a poison control center or emergency room at once. NOTE: This medicine is only for you. Do not share this medicine with others. What if I miss a dose? If you miss a dose, take it as soon as you can. If it is almost time for your next dose, take only that dose. Do not take double or extra doses. What may interact with this medicine? -herbal or dietary supplements, like blue cohosh, black cohosh, chasteberry, or DHEA -prasterone This list may not describe  all possible interactions. Give your health care provider a list of all the medicines, herbs, non-prescription drugs, or dietary supplements you use. Also tell them if you smoke, drink alcohol, or use illegal drugs. Some items may interact with your medicine. What should I watch for while using this medicine? Make sure you understand how and when to use this medicine. You need to know when you are ovulating and when to have sexual intercourse. This will increase the chance of a pregnancy. Visit your doctor or health care professional for regular checks on your progress. You may need tests to check the hormone levels in your blood or you may have to use home-urine tests to check for ovulation. Try to keep any appointments. Compared to other fertility treatments, this medicine does not greatly increase your chances of having multiple babies. An increased chance of having twins may occur in roughly 5 out of every 100 women who take this medication. Stop taking this medicine at once and contact your doctor or health care professional if you think you are pregnant. This medicine is not for long-term use. Most women that benefit from this medicine do so within the first three cycles (months). Your doctor or health care professional will monitor your condition. This medicine is usually used for a total of 6 cycles of treatment. You may get drowsy or dizzy. Do not drive, use machinery, or do anything that needs mental alertness until you know how this drug affects you. Do not stand or sit up quickly. This reduces the risk of dizzy or fainting spells. Drinking alcoholic beverages  or smoking tobacco may decrease your chance of becoming pregnant. Limit or stop alcohol and tobacco use during your fertility treatments. What side effects may I notice from receiving this medicine? Side effects that you should report to your doctor or health care professional as soon as possible: -allergic reactions like skin rash,  itching or hives, swelling of the face, lips, or tongue -breathing problems -changes in vision -fluid retention -nausea, vomiting -pelvic pain or bloating -severe abdominal pain -sudden weight gain Side effects that usually do not require medical attention (report to your doctor or health care professional if they continue or are bothersome): -breast discomfort -hot flashes -mild pelvic discomfort -mild nausea This list may not describe all possible side effects. Call your doctor for medical advice about side effects. You may report side effects to FDA at 1-800-FDA-1088. Where should I keep my medicine? Keep out of the reach of children. Store at room temperature between 15 and 30 degrees C (59 and 86 degrees F). Protect from heat, light, and moisture. Throw away any unused medicine after the expiration date. NOTE: This sheet is a summary. It may not cover all possible information. If you have questions about this medicine, talk to your doctor, pharmacist, or health care provider.    2016, Elsevier/Gold Standard. (2007-04-12 22:21:06)

## 2015-06-16 LAB — PAP IG W/ RFLX HPV ASCU: PAP Smear Comment: 0

## 2015-06-18 LAB — NUSWAB VG, CANDIDA 6SP
Candida albicans, NAA: NEGATIVE
Candida glabrata, NAA: NEGATIVE
Candida krusei, NAA: NEGATIVE
Candida lusitaniae, NAA: NEGATIVE
Candida parapsilosis, NAA: NEGATIVE
Candida tropicalis, NAA: NEGATIVE
Trich vag by NAA: NEGATIVE

## 2015-06-28 ENCOUNTER — Other Ambulatory Visit: Payer: Medicaid Other

## 2015-08-10 ENCOUNTER — Emergency Department (HOSPITAL_COMMUNITY)
Admission: EM | Admit: 2015-08-10 | Discharge: 2015-08-10 | Disposition: A | Discharge status: Home / Self Care | Payer: Medicaid Other | Attending: Emergency Medicine | Admitting: Emergency Medicine

## 2015-08-10 ENCOUNTER — Encounter (HOSPITAL_COMMUNITY): Payer: Self-pay

## 2015-08-10 ENCOUNTER — Emergency Department (HOSPITAL_COMMUNITY): Payer: Medicaid Other

## 2015-08-10 DIAGNOSIS — X501XXA Overexertion from prolonged static or awkward postures, initial encounter: Secondary | ICD-10-CM | POA: Diagnosis not present

## 2015-08-10 DIAGNOSIS — Y9301 Activity, walking, marching and hiking: Secondary | ICD-10-CM | POA: Diagnosis not present

## 2015-08-10 DIAGNOSIS — Y929 Unspecified place or not applicable: Secondary | ICD-10-CM | POA: Diagnosis not present

## 2015-08-10 DIAGNOSIS — Y999 Unspecified external cause status: Secondary | ICD-10-CM | POA: Diagnosis not present

## 2015-08-10 DIAGNOSIS — S8391XA Sprain of unspecified site of right knee, initial encounter: Secondary | ICD-10-CM | POA: Diagnosis not present

## 2015-08-10 DIAGNOSIS — S8991XA Unspecified injury of right lower leg, initial encounter: Secondary | ICD-10-CM | POA: Diagnosis present

## 2015-08-10 DIAGNOSIS — F17210 Nicotine dependence, cigarettes, uncomplicated: Secondary | ICD-10-CM | POA: Insufficient documentation

## 2015-08-10 MED ORDER — IBUPROFEN 800 MG PO TABS
800.0000 mg | ORAL_TABLET | Freq: Once | ORAL | Status: AC
  Administered 2015-08-10: 800 mg via ORAL
  Filled 2015-08-10: qty 1

## 2015-08-10 NOTE — ED Notes (Signed)
PT DISCHARGED. INSTRUCTIONS GIVEN. AAOX4. PT IN NO APPARENT DISTRESS. THE OPPORTUNITY TO ASK QUESTIONS WAS PROVIDED. 

## 2015-08-10 NOTE — ED Provider Notes (Signed)
[facility] DEPT Provider Note   CSN: [id number] Arrival date & time: 08/10/15  [id number]  First Provider Contact:  First MD Initiated Contact with Patient 08/10/15 [phone]        History   Chief Complaint Chief Complaint  Patient presents with  . Knee Pain    HPI Jessica Mcpherson is a 30 y.o. female presents with right knee pain. She's been having right knee pain since starting her new job at the [employer] about one month ago. However yesterday when walking she felt acutely worsen at the superior aspect of her knee. No increase in swelling. Has swelling almost every day when she gets home from work. She's been trying ice and icy hot. Occasionally uses ibuprofen but doesn't like taking medicine. Has not noticed any redness or fevers. No weakness. Since this pain started yesterday she has been noticing occasional pain similar to this when walking. It is a sharp pain.  HPI  Past Medical History:  Diagnosis Date  . Bronchitis     Patient Active Problem List   Diagnosis Date Noted  . Acute appendicitis 12/30/2013    Past Surgical History:  Procedure Laterality Date  . APPENDECTOMY    . CESAREAN SECTION    . LAPAROSCOPIC APPENDECTOMY N/A 12/30/2013   Procedure: APPENDECTOMY LAPAROSCOPIC;  Surgeon: Jackolyn Confer, MD;  Location: [facility];  Service: General;  Laterality: N/A;    OB History    No data available       Home Medications    Prior to Admission medications   Not on File    Family History Family History  Problem Relation Age of Onset  . Diabetes Mother   . Hypertension Mother   . Diabetes Maternal Grandmother   . Hypertension Maternal Grandmother   . Diabetes Maternal Grandfather     Social History Social History  Substance Use Topics  . Smoking status: Current Every Day Smoker    Types: Cigarettes    Last attempt to quit: 05/20/2015  . Smokeless tobacco: Never Used     Comment: has not smoked for 3 days  . Alcohol use No     Allergies     Penicillins and Pepperoni [pickled meat]   Review of Systems Review of Systems  Constitutional: Negative for fever.  Musculoskeletal: Positive for arthralgias.  Skin: Negative for color change and rash.  Neurological: Negative for weakness and numbness.  All other systems reviewed and are negative.    Physical Exam Updated Vital Signs BP 120/96 (BP Location: Right Arm)   Pulse 80   Temp 98.2 F (36.8 C) (Oral)   Resp 16   LMP 08/01/2015   SpO2 99%   Physical Exam  Constitutional: She is oriented to person, place, and time. She appears well-developed and well-nourished. No distress.  HENT:  Head: Normocephalic and atraumatic.  Right Ear: External ear normal.  Left Ear: External ear normal.  Nose: Nose normal.  Eyes: Right eye exhibits no discharge. Left eye exhibits no discharge.  Cardiovascular: Normal rate and regular rhythm.   Pulses:      Dorsalis pedis pulses are 2+ on the right side, and 2+ on the left side.  Pulmonary/Chest: Effort normal.  Abdominal: She exhibits no distension.  Musculoskeletal:       Right knee: She exhibits normal range of motion, no effusion, no ecchymosis, no deformity and no erythema. Tenderness found. Medial joint line and lateral joint line tenderness noted.       Right upper leg: She exhibits  no tenderness.       Right lower leg: She exhibits no tenderness.  Able to straight leg raise without difficulty. Possibly mild swelling compared to left but no effusion, erythema or increased warmth  Neurological: She is alert and oriented to person, place, and time.  Skin: Skin is warm and dry. She is not diaphoretic.  Nursing note and vitals reviewed.    ED Treatments / Results  Labs (all labs ordered are listed, but only abnormal results are displayed) Labs Reviewed - No data to display  EKG  EKG Interpretation None       Radiology Dg Knee Complete 4 Views Right  Result Date: 08/10/2015 CLINICAL DATA:  Right knee discomfort for 2  weeks with development of sharp pain yesterday. No known injury. Initial encounter. EXAM: RIGHT KNEE - COMPLETE 4+ VIEW COMPARISON:  Plain films right knee 07/30/2015. FINDINGS: No evidence of fracture, dislocation, or joint effusion. No evidence of arthropathy or other focal bone abnormality. Soft tissues are unremarkable. IMPRESSION: Normal exam. Electronically Signed   By: Inge Rise M.D.   On: 08/10/2015 08:55   Procedures Procedures (including critical care time)  Medications Ordered in ED Medications  ibuprofen (ADVIL,MOTRIN) tablet 800 mg (800 mg Oral Given 08/10/15 0903)     Initial Impression / Assessment and Plan / ED Course  I have reviewed the triage vital signs and the nursing notes.  Pertinent labs & imaging results that were available during my care of the patient were reviewed by me and considered in my medical decision making (see chart for details).  Clinical Course  Comment By Time  Will get xray although I think a muscle strain or mild ligament injury is more likely. Will give ibuprofen. Sherwood Gambler, MD 07/28 [phone]  Knee xray unremarkable. Likely a sprain. NSAIDs, ice, and knee sleeve. F/u with her PCP and may need physical therapy. Sherwood Gambler, MD 07/28 [phone]    Final Clinical Impressions(s) / ED Diagnoses   Final diagnoses:  Right knee sprain, initial encounter    New Prescriptions New Prescriptions   No medications on file     Sherwood Gambler, MD 08/10/15 [phone]

## 2015-08-10 NOTE — ED Triage Notes (Signed)
Pt c/o R knee discomfort x 2 weeks.   Sts she started having sharp pain yesterday.  Pain score 7/10.  Pt reports that she recently started a job at [employer] that includes "a lot of walking."  Pt reports taking ibuprofen w/o relief.

## 2016-02-15 ENCOUNTER — Emergency Department (HOSPITAL_COMMUNITY)
Admission: EM | Admit: 2016-02-15 | Discharge: 2016-02-15 | Disposition: A | Discharge status: Home / Self Care | Payer: Commercial Managed Care - HMO | Attending: Emergency Medicine | Admitting: Emergency Medicine

## 2016-02-15 ENCOUNTER — Encounter (HOSPITAL_COMMUNITY): Payer: Self-pay

## 2016-02-15 DIAGNOSIS — F17210 Nicotine dependence, cigarettes, uncomplicated: Secondary | ICD-10-CM | POA: Insufficient documentation

## 2016-02-15 DIAGNOSIS — B349 Viral infection, unspecified: Secondary | ICD-10-CM | POA: Diagnosis not present

## 2016-02-15 DIAGNOSIS — R509 Fever, unspecified: Secondary | ICD-10-CM | POA: Diagnosis present

## 2016-02-15 LAB — URINALYSIS, ROUTINE W REFLEX MICROSCOPIC
Bilirubin Urine: NEGATIVE
Glucose, UA: NEGATIVE mg/dL
Hgb urine dipstick: NEGATIVE
Ketones, ur: NEGATIVE mg/dL
Leukocytes, UA: NEGATIVE
Nitrite: NEGATIVE
Protein, ur: NEGATIVE mg/dL
Specific Gravity, Urine: 1.023 (ref 1.005–1.030)
pH: 5 (ref 5.0–8.0)

## 2016-02-15 LAB — POC URINE PREG, ED: Preg Test, Ur: NEGATIVE

## 2016-02-15 MED ORDER — ONDANSETRON 8 MG PO TBDP
8.0000 mg | ORAL_TABLET | Freq: Once | ORAL | Status: AC
  Administered 2016-02-15: 8 mg via ORAL
  Filled 2016-02-15: qty 1

## 2016-02-15 MED ORDER — OSELTAMIVIR PHOSPHATE 75 MG PO CAPS: 75.0000 mg | ORAL_CAPSULE | Freq: Two times a day (BID) | ORAL | 0 refills | Status: DC

## 2016-02-15 NOTE — ED Notes (Signed)
ED Provider at bedside. 

## 2016-02-15 NOTE — ED Triage Notes (Addendum)
Pt states fever of 101 and emesis since last night.  Took ibuprofen 1 hr ago.  Pt also states she is several weeks late on her menstrual.  Could be pregnant.

## 2016-02-15 NOTE — ED Provider Notes (Signed)
[facility] DEPT Provider Note   CSN: [id number] Arrival date & time: 02/15/16  0741     History   Chief Complaint Chief Complaint  Patient presents with  . Fever  . Emesis    HPI Jessica Mcpherson is a 31 y.o. female.  31 year old female presents with 24-hour history of fever to 101 with associated emesis last night. Took ibuprofen which did relieve her symptoms. Has had mild sore throat but denies any cough or congestion. Last menstrual period was several months ago and is concerned she may be pregnant. She denies any vaginal bleeding or discharge. Has had some polyuria but denies any dysuria. No history of diabetes. Notes positive sick exposures.      Past Medical History:  Diagnosis Date  . Bronchitis     Patient Active Problem List   Diagnosis Date Noted  . Acute appendicitis 12/30/2013    Past Surgical History:  Procedure Laterality Date  . APPENDECTOMY    . CESAREAN SECTION    . LAPAROSCOPIC APPENDECTOMY N/A 12/30/2013   Procedure: APPENDECTOMY LAPAROSCOPIC;  Surgeon: Jackolyn Confer, MD;  Location: [facility];  Service: General;  Laterality: N/A;    OB History    No data available       Home Medications    Prior to Admission medications   Medication Sig Start Date End Date Taking? Authorizing Provider  ibuprofen (ADVIL,MOTRIN) 200 MG tablet Take 400 mg by mouth every 6 (six) hours as needed for fever.   Yes Historical Provider, MD    Family History Family History  Problem Relation Age of Onset  . Diabetes Mother   . Hypertension Mother   . Diabetes Maternal Grandmother   . Hypertension Maternal Grandmother   . Diabetes Maternal Grandfather     Social History Social History  Substance Use Topics  . Smoking status: Current Every Day Smoker    Types: Cigarettes    Last attempt to quit: 05/20/2015  . Smokeless tobacco: Never Used     Comment: has not smoked for 3 days  . Alcohol use No     Allergies   Penicillins and Pepperoni [pickled  meat]   Review of Systems Review of Systems  All other systems reviewed and are negative.    Physical Exam Updated Vital Signs BP 118/72 (BP Location: Left Arm)   Pulse 77   Temp 98.2 F (36.8 C) (Oral)   Resp 18   Ht 5' (1.524 m)   Wt 72.6 kg   LMP  (LMP Unknown)   SpO2 100%   BMI 31.25 kg/m   Physical Exam  Constitutional: She is oriented to person, place, and time. She appears well-developed and well-nourished.  Non-toxic appearance. No distress.  HENT:  Head: Normocephalic and atraumatic.  Eyes: Conjunctivae, EOM and lids are normal. Pupils are equal, round, and reactive to light.  Neck: Normal range of motion. Neck supple. No tracheal deviation present. No thyroid mass present.  Cardiovascular: Normal rate, regular rhythm and normal heart sounds.  Exam reveals no gallop.   No murmur heard. Pulmonary/Chest: Effort normal and breath sounds normal. No stridor. No respiratory distress. She has no decreased breath sounds. She has no wheezes. She has no rhonchi. She has no rales.  Abdominal: Soft. Normal appearance and bowel sounds are normal. She exhibits no distension. There is no tenderness. There is no rebound and no CVA tenderness.  Musculoskeletal: Normal range of motion. She exhibits no edema or tenderness.  Neurological: She is alert and oriented to person,  place, and time. She has normal strength. No cranial nerve deficit or sensory deficit. GCS eye subscore is 4. GCS verbal subscore is 5. GCS motor subscore is 6.  Skin: Skin is warm and dry. No abrasion and no rash noted.  Psychiatric: She has a normal mood and affect. Her speech is normal and behavior is normal.  Nursing note and vitals reviewed.    ED Treatments / Results  Labs (all labs ordered are listed, but only abnormal results are displayed) Labs Reviewed - No data to display  EKG  EKG Interpretation None       Radiology No results found.  Procedures Procedures (including critical care  time)  Medications Ordered in ED Medications - No data to display   Initial Impression / Assessment and Plan / ED Course  I have reviewed the triage vital signs and the nursing notes.  Pertinent labs & imaging results that were available during my care of the patient were reviewed by me and considered in my medical decision making (see chart for details).     Patient with likely influenza. Will prescribe Tamiflu to take if the patient desires to  Final Clinical Impressions(s) / ED Diagnoses   Final diagnoses:  None    New Prescriptions New Prescriptions   No medications on file     Lacretia Leigh, MD 02/15/16 [phone]

## 2016-02-15 NOTE — ED Notes (Signed)
Discharge instructions, follow up care, and rx x1 reviewed with patient. Patient verbalized understanding. 

## 2016-02-16 LAB — URINE CULTURE: Culture: <10000 — AB

## 2016-03-14 ENCOUNTER — Emergency Department (HOSPITAL_COMMUNITY)
Admission: EM | Admit: 2016-03-14 | Discharge: 2016-03-14 | Disposition: A | Discharge status: Home / Self Care | Payer: Commercial Managed Care - HMO | Attending: Emergency Medicine | Admitting: Emergency Medicine

## 2016-03-14 ENCOUNTER — Encounter (HOSPITAL_COMMUNITY): Payer: Self-pay | Admitting: Emergency Medicine

## 2016-03-14 ENCOUNTER — Emergency Department (HOSPITAL_COMMUNITY): Payer: Commercial Managed Care - HMO

## 2016-03-14 DIAGNOSIS — Z79899 Other long term (current) drug therapy: Secondary | ICD-10-CM | POA: Insufficient documentation

## 2016-03-14 DIAGNOSIS — F17210 Nicotine dependence, cigarettes, uncomplicated: Secondary | ICD-10-CM | POA: Diagnosis not present

## 2016-03-14 DIAGNOSIS — Y990 Civilian activity done for income or pay: Secondary | ICD-10-CM | POA: Insufficient documentation

## 2016-03-14 DIAGNOSIS — Y939 Activity, unspecified: Secondary | ICD-10-CM | POA: Diagnosis not present

## 2016-03-14 DIAGNOSIS — M79671 Pain in right foot: Secondary | ICD-10-CM | POA: Diagnosis not present

## 2016-03-14 DIAGNOSIS — Y929 Unspecified place or not applicable: Secondary | ICD-10-CM | POA: Diagnosis not present

## 2016-03-14 DIAGNOSIS — W208XXA Other cause of strike by thrown, projected or falling object, initial encounter: Secondary | ICD-10-CM | POA: Diagnosis not present

## 2016-03-14 LAB — POC URINE PREG, ED: Preg Test, Ur: NEGATIVE

## 2016-03-14 MED ORDER — IBUPROFEN 600 MG PO TABS: 600.0000 mg | ORAL_TABLET | Freq: Three times a day (TID) | ORAL | 0 refills | Status: DC | PRN

## 2016-03-14 NOTE — ED Notes (Signed)
Bed: WTR5 Expected date:  Expected time:  Means of arrival:  Comments: 

## 2016-03-14 NOTE — ED Provider Notes (Signed)
[place] DEPT Provider Note   CSN: [id number] Arrival date & time: 03/14/16  [id number]   By signing my name below, I, Jessica Mcpherson, attest that this documentation has been prepared under the direction and in the presence of [facility], PA-C. Electronically Signed: Sonum Mcpherson, Education administrator. 03/14/16. 11:20 AM.  History   Chief Complaint Chief Complaint  Patient presents with  . Foot Pain    The history is provided by the patient. No language interpreter was used.     HPI Comments: Jessica Mcpherson is a 31 y.o. female who presents to the Emergency Department complaining of gradual onset, constant, gradually worsened right foot pain that began after an injury yesterday. She states she works for [employer] and accidentally dropped a ~30 pound box on her foot. She notes having associated swelling that she noticed this morning. She has taken Tylenol without significant relief of pain but has applied ice with some relief of swelling. She states the pain is worse with plantarflexion and she has noticed a black spot on her great toe nail. She denies CP, SOB, numbness.    Past Medical History:  Diagnosis Date  . Bronchitis     Patient Active Problem List   Diagnosis Date Noted  . Acute appendicitis 12/30/2013    Past Surgical History:  Procedure Laterality Date  . APPENDECTOMY    . CESAREAN SECTION    . LAPAROSCOPIC APPENDECTOMY N/A 12/30/2013   Procedure: APPENDECTOMY LAPAROSCOPIC;  Surgeon: Jackolyn Confer, MD;  Location: [facility];  Service: General;  Laterality: N/A;    OB History    No data available       Home Medications    Prior to Admission medications   Medication Sig Start Date End Date Taking? Authorizing Provider  acetaminophen (TYLENOL) 500 MG chewable tablet Chew 500 mg by mouth once.   Yes Historical Provider, MD  ibuprofen (ADVIL,MOTRIN) 600 MG tablet Take 1 tablet (600 mg total) by mouth every 8 (eight) hours as needed. 03/14/16   Ozella Almond Dustyn Armbrister, PA-C  oseltamivir  (TAMIFLU) 75 MG capsule Take 1 capsule (75 mg total) by mouth every 12 (twelve) hours. 02/15/16   Lacretia Leigh, MD    Family History Family History  Problem Relation Age of Onset  . Diabetes Mother   . Hypertension Mother   . Diabetes Maternal Grandmother   . Hypertension Maternal Grandmother   . Diabetes Maternal Grandfather   . Hypertension Father     Social History Social History  Substance Use Topics  . Smoking status: Current Every Day Smoker    Types: Cigarettes  . Smokeless tobacco: Never Used     Comment: has not smoked for 3 days  . Alcohol use No     Allergies   Penicillins and Pepperoni [pickled meat]   Review of Systems Review of Systems  Musculoskeletal: Positive for arthralgias and joint swelling.  Neurological: Negative for weakness and numbness.     Physical Exam Updated Vital Signs BP 120/79 (BP Location: Right Arm)   Pulse 78   Temp 98.4 F (36.9 C) (Oral)   Resp 16   Wt 68.9 kg   LMP 02/18/2016 (Approximate)   SpO2 100%   BMI 29.69 kg/m   Physical Exam  Constitutional: She is oriented to person, place, and time. She appears well-developed and well-nourished. No distress.  HENT:  Head: Normocephalic and atraumatic.  Cardiovascular: Normal rate, regular rhythm and normal heart sounds.   No murmur heard. Pulmonary/Chest: Effort normal and breath sounds normal. No  respiratory distress.  Abdominal: Soft. She exhibits no distension. There is no tenderness.  Musculoskeletal:  Tenderness to palpation to right mcp. No open wounds. 2+ DP. Sensation intact. Full ROM. Able to wiggle toes.   Neurological: She is alert and oriented to person, place, and time.  Skin: Skin is warm and dry.  Nursing note and vitals reviewed.    ED Treatments / Results  DIAGNOSTIC STUDIES: Oxygen Saturation is 100% on RA, normal by my interpretation.    COORDINATION OF CARE: 11:01 AM Discussed treatment plan with pt at bedside and pt agreed to plan.   Labs (all  labs ordered are listed, but only abnormal results are displayed) Labs Reviewed  POC URINE PREG, ED    EKG  EKG Interpretation None       Radiology Dg Foot Complete Right  Result Date: 03/14/2016 CLINICAL DATA:  Dropped a box on right foot.  Dorsal midfoot pain. EXAM: RIGHT FOOT COMPLETE - 3+ VIEW COMPARISON:  None FINDINGS: The joint spaces are maintained. No acute fracture is identified. Type 2 os tibialis externum. IMPRESSION: No acute bony findings. Electronically Signed   By: Marijo Sanes M.D.   On: 03/14/2016 10:54    Procedures Procedures (including critical care time)  Medications Ordered in ED Medications - No data to display   Initial Impression / Assessment and Plan / ED Course  I have reviewed the triage vital signs and the nursing notes.  Pertinent labs & imaging results that were available during my care of the patient were reviewed by me and considered in my medical decision making (see chart for details).    ARMINDA Mcpherson is a 31 y.o. female who presents to ED for right foot pain after dropping item on the foot. On exam, RLE is NVI. X-ray negative for acute injury. Symptomatic home care instructions discussed. Rx for ibuprofen given. PCP follow up encouraged. All questions answered.   Final Clinical Impressions(s) / ED Diagnoses   Final diagnoses:  Foot pain, right    New Prescriptions New Prescriptions   IBUPROFEN (ADVIL,MOTRIN) 600 MG TABLET    Take 1 tablet (600 mg total) by mouth every 8 (eight) hours as needed.   I personally performed the services described in this documentation, which was scribed in my presence. The recorded information has been reviewed and is accurate.    [facility] Darcella Shiffman, PA-C 03/14/16 1141    Jola Schmidt, MD 03/14/16 1726

## 2016-03-14 NOTE — ED Triage Notes (Signed)
Pt reports that a box dropped on to her r/foot yesterday. Pain and swelling noted in r/toes this morning. Pt is alert, oriented and ambulatory. Pt is not drivinng

## 2016-03-14 NOTE — Discharge Instructions (Signed)
Ibuprofen as needed for pain. Ice affected area for additional pain relief.  Return to ER for new or worsening symptoms, any additional concerns.   COLD THERAPY DIRECTIONS:  Ice or gel packs can be used to reduce both pain and swelling. Ice is the most helpful within the first 24 to 48 hours after an injury or flareup from overusing a muscle or joint.  Ice is effective, has very few side effects, and is safe for most people to use.   If you expose your skin to cold temperatures for too long or without the proper protection, you can damage your skin or nerves. Watch for signs of skin damage due to cold.   HOME CARE INSTRUCTIONS  Follow these tips to use ice and cold packs safely.  Place a dry or damp towel between the ice and skin. A damp towel will cool the skin more quickly, so you may need to shorten the time that the ice is used.  For a more rapid response, add gentle compression to the ice.  Ice for no more than 10 to 20 minutes at a time. The bonier the area you are icing, the less time it will take to get the benefits of ice.  Check your skin after 5 minutes to make sure there are no signs of a poor response to cold or skin damage.  Rest 20 minutes or more in between uses.  Once your skin is numb, you can end your treatment. You can test numbness by very lightly touching your skin. The touch should be so light that you do not see the skin dimple from the pressure of your fingertip. When using ice, most people will feel these normal sensations in this order: cold, burning, aching, and numbness.

## 2016-05-29 ENCOUNTER — Emergency Department (HOSPITAL_COMMUNITY): Payer: Commercial Managed Care - HMO

## 2016-05-29 ENCOUNTER — Emergency Department (HOSPITAL_COMMUNITY)
Admission: EM | Admit: 2016-05-29 | Discharge: 2016-05-29 | Disposition: A | Discharge status: Home / Self Care | Payer: Commercial Managed Care - HMO | Attending: Emergency Medicine | Admitting: Emergency Medicine

## 2016-05-29 ENCOUNTER — Encounter (HOSPITAL_COMMUNITY): Payer: Self-pay | Admitting: Emergency Medicine

## 2016-05-29 DIAGNOSIS — F17210 Nicotine dependence, cigarettes, uncomplicated: Secondary | ICD-10-CM | POA: Diagnosis not present

## 2016-05-29 DIAGNOSIS — Z79899 Other long term (current) drug therapy: Secondary | ICD-10-CM | POA: Insufficient documentation

## 2016-05-29 DIAGNOSIS — R0602 Shortness of breath: Secondary | ICD-10-CM | POA: Diagnosis present

## 2016-05-29 DIAGNOSIS — J4521 Mild intermittent asthma with (acute) exacerbation: Secondary | ICD-10-CM | POA: Insufficient documentation

## 2016-05-29 DIAGNOSIS — R05 Cough: Secondary | ICD-10-CM

## 2016-05-29 DIAGNOSIS — R059 Cough, unspecified: Secondary | ICD-10-CM

## 2016-05-29 MED ORDER — ALBUTEROL SULFATE HFA 108 (90 BASE) MCG/ACT IN AERS
2.0000 | INHALATION_SPRAY | Freq: Once | RESPIRATORY_TRACT | Status: AC
  Administered 2016-05-29: 2 via RESPIRATORY_TRACT
  Filled 2016-05-29: qty 6.7

## 2016-05-29 NOTE — ED Triage Notes (Addendum)
Pt c/o shortness of breath this morning and cough. Pt states productive cough with white sputum. Pt c/o seasonal allergies and states she has not tried any medication on her own. Pt alert and oriented and no distress in triage. Sats 100% RA. Difficulty obtaining medical history as patient would not d/c phone call in triage.

## 2016-05-29 NOTE — ED Provider Notes (Signed)
[place] DEPT Provider Note    .By signing my name below, I, [name], attest that this documentation has been prepared under the direction and in the presence of [address] [employer]. Electronically Signed: [name], ED Scribe. 05/29/16. 10:45 AM.    History   Chief Complaint Chief Complaint  Patient presents with  . Shortness of Breath   The history is provided by the patient and medical records. No language interpreter was used.  Shortness of Breath  This is a new problem. The average episode lasts 2 hours. The problem occurs intermittently.The current episode started 1 to 2 hours ago. The problem has been gradually improving. Associated symptoms include cough and wheezing. Pertinent negatives include no fever, no rhinorrhea, no sore throat, no hemoptysis, no chest pain, no vomiting, no abdominal pain and no leg swelling. The problem's precipitants include weather/humidity and pollens. She has tried nothing for the symptoms. The treatment provided no relief. Associated medical issues include asthma (not formally diagnosed).    Jessica Mcpherson is a 31 y.o. female with a PMHx of bronchitis and not-formally-diagnosed asthma, who presents to the Emergency Department complaining of improving SOB that began about 1.5 hours ago. She reports associated wheezing and cough with white sputum production.  She states she has had an asthma attack in the past and this felt similar. She denies ever having a diagnosis of asthma. She works at the [employer] and is outside frequently and reports seasonal allergies, stating she thinks this triggered today's event. She has not taken anything for her symptoms. There are no modifying factors noted. She denies fever, chills, rhinorrhea, sore throat, leg swelling, hemoptysis, CP, abdominal pain, constipation, diarrhea, nausea, vomiting, dysuria, hematuria, numbness, tingling, focal weakness, or any other complaints at this time. She denies any  recent travel or surgery/immobilization. She denies any estrogen therapy. She denies personal or family h/o PE or DVT.   Past Medical History:  Diagnosis Date  . Bronchitis     Patient Active Problem List   Diagnosis Date Noted  . Acute appendicitis 12/30/2013    Past Surgical History:  Procedure Laterality Date  . APPENDECTOMY    . CESAREAN SECTION    . LAPAROSCOPIC APPENDECTOMY N/A 12/30/2013   Procedure: APPENDECTOMY LAPAROSCOPIC;  Surgeon: Jackolyn Confer, MD;  Location: [facility];  Service: General;  Laterality: N/A;    OB History    No data available       Home Medications    Prior to Admission medications   Medication Sig Start Date End Date Taking? Authorizing Provider  acetaminophen (TYLENOL) 500 MG chewable tablet Chew 500 mg by mouth once.    [provider]  ibuprofen (ADVIL,MOTRIN) 600 MG tablet Take 1 tablet (600 mg total) by mouth every 8 (eight) hours as needed. 03/14/16   Ward, Ozella Almond, PA-C  oseltamivir (TAMIFLU) 75 MG capsule Take 1 capsule (75 mg total) by mouth every 12 (twelve) hours. 02/15/16   Lacretia Leigh, MD    Family History Family History  Problem Relation Age of Onset  . Diabetes Mother   . Hypertension Mother   . Diabetes Maternal Grandmother   . Hypertension Maternal Grandmother   . Diabetes Maternal Grandfather   . Hypertension Father     Social History Social History  Substance Use Topics  . Smoking status: Current Every Day Smoker    Types: Cigarettes  . Smokeless tobacco: Never Used     Comment: has not smoked for 3 days  . Alcohol use No  Allergies   Penicillins and Pepperoni [pickled meat]   Review of Systems Review of Systems  Constitutional: Negative for chills and fever.  HENT: Positive for congestion. Negative for rhinorrhea and sore throat.   Respiratory: Positive for cough, shortness of breath and wheezing. Negative for hemoptysis.   Cardiovascular: Negative for chest pain and leg swelling.    Gastrointestinal: Negative for abdominal pain, constipation, diarrhea, nausea and vomiting.  Genitourinary: Negative for dysuria and hematuria.  Musculoskeletal: Negative for arthralgias and myalgias.  Skin: Negative for color change.  Allergic/Immunologic: Negative for immunocompromised state.  Neurological: Negative for weakness and numbness.  Psychiatric/Behavioral: Negative for confusion.  All other systems reviewed and are negative for acute change except as noted in the HPI.     Physical Exam Updated Vital Signs BP 132/82 (BP Location: Right Arm)   Pulse 80   Temp 98.1 F (36.7 C) (Oral)   Resp 18   SpO2 100%   Physical Exam  Constitutional: She is oriented to person, place, and time. Vital signs are normal. She appears well-developed and well-nourished.  Non-toxic appearance. No distress.  Afebrile, nontoxic, NAD  HENT:  Head: Normocephalic and atraumatic.  Mouth/Throat: Oropharynx is clear and moist and mucous membranes are normal.  Eyes: Conjunctivae and EOM are normal. Right eye exhibits no discharge. Left eye exhibits no discharge.  Neck: Normal range of motion. Neck supple.  Cardiovascular: Normal rate, regular rhythm, normal heart sounds and intact distal pulses.  Exam reveals no gallop and no friction rub.   No murmur heard. Pulmonary/Chest: Effort normal and breath sounds normal. No respiratory distress. She has no decreased breath sounds. She has no wheezes. She has no rhonchi. She has no rales.  CTAB in all lung fields, no w/r/r, no hypoxia or increased WOB, speaking in full sentences, SpO2 100% on RA  Abdominal: Soft. Normal appearance and bowel sounds are normal. She exhibits no distension. There is no tenderness. There is no rigidity, no rebound, no guarding, no CVA tenderness, no tenderness at McBurney's point and negative Murphy's sign.  Musculoskeletal: Normal range of motion.  Neurological: She is alert and oriented to person, place, and time. She has normal  strength. No sensory deficit.  Skin: Skin is warm, dry and intact. No rash noted.  Psychiatric: She has a normal mood and affect.  Nursing note and vitals reviewed.    ED Treatments / Results  DIAGNOSTIC STUDIES: Oxygen Saturation is 100% on RA, normal by my interpretation.   COORDINATION OF CARE: 10:35 AM- Will order MDI and CXR. Pt verbalizes understanding and agrees to plan.  Medications  albuterol (PROVENTIL HFA;VENTOLIN HFA) 108 (90 Base) MCG/ACT inhaler 2 puff (2 puffs Inhalation Given 05/29/16 1050)    Labs (all labs ordered are listed, but only abnormal results are displayed) Labs Reviewed - No data to display  EKG  EKG Interpretation None       Radiology Dg Chest 2 View  Result Date: 05/29/2016 CLINICAL DATA:  New onset shortness of breath, cough EXAM: CHEST  2 VIEW COMPARISON:  05/22/2015 FINDINGS: Lungs are clear.  No pleural effusion or pneumothorax. The heart is normal size. Visualized osseous structures are within normal limits. IMPRESSION: Normal chest radiographs. Electronically Signed   By: Julian Hy M.D.   On: 05/29/2016 10:58    Procedures Procedures (including critical care time)  Medications Ordered in ED Medications  albuterol (PROVENTIL HFA;VENTOLIN HFA) 108 (90 Base) MCG/ACT inhaler 2 puff (2 puffs Inhalation Given 05/29/16 1050)     Initial  Impression / Assessment and Plan / ED Course  I have reviewed the triage vital signs and the nursing notes.  Pertinent labs & imaging results that were available during my care of the patient were reviewed by me and considered in my medical decision making (see chart for details).     31 y.o. female here with SOB and cough x2 hrs. States it feels like prior asthma issues, although not formally diagnosed with asthma but reports season changes seem to cause this. On exam, clear lungs, no tachycardia/hypoxia, no respiratory distress, speaking in full sentences. Will obtain CXR to ensure no underlying  etiology, and give inhaler, then reassess shortly. Doubt need for further emergent work up  11:43 AM CXR neg. Pt feeling improved after inhaler. Advised use of this, as well as daily antihistamine, and other OTC remedies for symptomatic relief. F/up with PCP in 1wk for recheck of symptoms. I explained the diagnosis and have given explicit precautions to return to the ER including for any other new or worsening symptoms. The patient understands and accepts the medical plan as it's been dictated and I have answered their questions. Discharge instructions concerning home care and prescriptions have been given. The patient is STABLE and is discharged to home in good condition.   I personally performed the services described in this documentation, which was scribed in my presence. The recorded information has been reviewed and is accurate.    Final Clinical Impressions(s) / ED Diagnoses   Final diagnoses:  SOB (shortness of breath)  Cough  Mild intermittent asthma with exacerbation    New Prescriptions New Prescriptions   No medications on [address], [place], [place] 05/29/16 1143    Drenda Freeze, MD 05/31/16 [phone]

## 2016-05-29 NOTE — Discharge Instructions (Signed)
Continue to stay well-hydrated. Use Mucinex for cough suppression/expectoration of mucus. Use over the counter antihistamines such as zyrtec or allegra to decrease symptoms and frequency of asthma attacks. Use inhaler as directed, as needed for cough/chest congestion/wheezing/shortness of breath/etc. Followup with your regular doctor in 5-7 days for recheck of ongoing symptoms. Return to emergency department for emergent changing or worsening of symptoms.

## 2016-06-12 DIAGNOSIS — Z3A00 Weeks of gestation of pregnancy not specified: Secondary | ICD-10-CM | POA: Diagnosis not present

## 2016-06-12 DIAGNOSIS — O26899 Other specified pregnancy related conditions, unspecified trimester: Secondary | ICD-10-CM | POA: Diagnosis not present

## 2016-06-12 NOTE — ED Triage Notes (Signed)
Pt reports intense abd pain since 4pm. Pt reports being [redacted] weeks pregnant at this time. Pt denies vaginal bleeding or spotting. Pt has hx of miscarriage. Pt A+OX4, speaking in complete sentences, rating abd pain at time of triage 4/10. Pt denies n/v, diarrhea.

## 2016-06-13 ENCOUNTER — Emergency Department (HOSPITAL_COMMUNITY): Payer: Commercial Managed Care - HMO

## 2016-06-13 ENCOUNTER — Emergency Department (HOSPITAL_COMMUNITY)
Admission: EM | Admit: 2016-06-13 | Discharge: 2016-06-13 | Disposition: A | Discharge status: Home / Self Care | Payer: Commercial Managed Care - HMO | Attending: Emergency Medicine | Admitting: Emergency Medicine

## 2016-06-13 ENCOUNTER — Encounter (HOSPITAL_COMMUNITY): Payer: Self-pay

## 2016-06-13 DIAGNOSIS — R102 Pelvic and perineal pain: Secondary | ICD-10-CM

## 2016-06-13 DIAGNOSIS — O3680X0 Pregnancy with inconclusive fetal viability, not applicable or unspecified: Secondary | ICD-10-CM

## 2016-06-13 DIAGNOSIS — O26899 Other specified pregnancy related conditions, unspecified trimester: Secondary | ICD-10-CM

## 2016-06-13 HISTORY — DX: Complete or unspecified spontaneous abortion without complication: O03.9

## 2016-06-13 LAB — COMPREHENSIVE METABOLIC PANEL
ALT: 35 U/L (ref 14–54)
AST: 29 U/L (ref 15–41)
Albumin: 3.8 g/dL (ref 3.5–5.0)
Alkaline Phosphatase: 74 U/L (ref 38–126)
Anion gap: 8 (ref 5–15)
BUN: 8 mg/dL (ref 6–20)
CO2: 21 mmol/L — ABNORMAL LOW (ref 22–32)
Calcium: 9.2 mg/dL (ref 8.9–10.3)
Chloride: 107 mmol/L (ref 101–111)
Creatinine, Ser: 0.65 mg/dL (ref 0.44–1.00)
GFR calc Af Amer: >60 mL/min (ref >60)
GFR calc non Af Amer: >60 mL/min (ref >60)
Glucose, Bld: 99 mg/dL (ref 65–99)
Potassium: 4 mmol/L (ref 3.5–5.1)
Sodium: 136 mmol/L (ref 135–145)
Total Bilirubin: 0.4 mg/dL (ref 0.3–1.2)
Total Protein: 7.7 g/dL (ref 6.5–8.1)

## 2016-06-13 LAB — URINALYSIS, ROUTINE W REFLEX MICROSCOPIC
Bilirubin Urine: NEGATIVE
Glucose, UA: NEGATIVE mg/dL
Hgb urine dipstick: NEGATIVE
Ketones, ur: NEGATIVE mg/dL
Leukocytes, UA: NEGATIVE
Nitrite: NEGATIVE
Protein, ur: NEGATIVE mg/dL
Specific Gravity, Urine: 1.011 (ref 1.005–1.030)
pH: 6 (ref 5.0–8.0)

## 2016-06-13 LAB — CBC
HCT: 41.1 % (ref 36.0–46.0)
Hemoglobin: 13.7 g/dL (ref 12.0–15.0)
MCH: 29.2 pg (ref 26.0–34.0)
MCHC: 33.3 g/dL (ref 30.0–36.0)
MCV: 87.6 fL (ref 78.0–100.0)
Platelets: 288 10*3/uL (ref 150–400)
RBC: 4.69 MIL/uL (ref 3.87–5.11)
RDW: 13.5 % (ref 11.5–15.5)
WBC: 14.1 10*3/uL — ABNORMAL HIGH (ref 4.0–10.5)

## 2016-06-13 LAB — LIPASE, BLOOD: Lipase: 21 U/L (ref 11–51)

## 2016-06-13 LAB — I-STAT BETA HCG BLOOD, ED (MC, WL, AP ONLY): I-stat hCG, quantitative: 372.7 m[IU]/mL — ABNORMAL HIGH (ref <5)

## 2016-06-13 NOTE — ED Notes (Signed)
ED Provider at bedside. 

## 2016-06-13 NOTE — ED Provider Notes (Signed)
[facility] DEPT Provider Note   CSN: [id number] Arrival date & time: 06/12/16  2314  By signing my name below, I, Collene Leyden, attest that this documentation has been prepared under the direction and in the presence of Waynetta Pean, PA-C. Electronically Signed: Collene Leyden, Scribe. 06/13/16. 4:41 AM.   History   Chief Complaint Chief Complaint  Patient presents with  . Abdominal Pain   HPI Comments: Jessica Mcpherson is a 31 y.o. female  who presents to the Emergency Department complaining of abdominal cramping that began at 4 pm yesterday. Patient reports going to her OB earlier today, in which she was told that she is pregnant and they estimate [redacted] weeks pregnant based on LMP of 02/18/16.   Patient has had a miscarriage in the past. She denies vaginal bleeding or discharge.  No modifying factors indicated. Patient rates the severity of her pain as a 4/10. Patient denies any dysuria, hematuria, vaginal discharge, vaginal bleeding, fever, chills, nausea, or vomiting.   The history is provided by the patient. No language interpreter was used.    Past Medical History:  Diagnosis Date  . Bronchitis   . Miscarriage     Patient Active Problem List   Diagnosis Date Noted  . Acute appendicitis 12/30/2013    Past Surgical History:  Procedure Laterality Date  . APPENDECTOMY    . CESAREAN SECTION    . LAPAROSCOPIC APPENDECTOMY N/A 12/30/2013   Procedure: APPENDECTOMY LAPAROSCOPIC;  Surgeon: Jackolyn Confer, MD;  Location: [facility];  Service: General;  Laterality: N/A;    OB History    Gravida Para Term Preterm AB Living   1             SAB TAB Ectopic Multiple Live Births                   Home Medications    Prior to Admission medications   Medication Sig Start Date End Date Taking? Authorizing Provider  Prenat-FeAsp-Meth-FA-DHA w/o A (PRENATE PIXIE) 10-0.6-0.4-200 MG CAPS Take 1 capsule by mouth daily.   Yes [provider]    Family History Family History   Problem Relation Age of Onset  . Diabetes Mother   . Hypertension Mother   . Diabetes Maternal Grandmother   . Hypertension Maternal Grandmother   . Diabetes Maternal Grandfather   . Hypertension Father     Social History Social History  Substance Use Topics  . Smoking status: Current Every Day Smoker    Types: Cigarettes  . Smokeless tobacco: Never Used     Comment: has not smoked for 3 days  . Alcohol use No     Allergies   Penicillins and Pepperoni [pickled meat]   Review of Systems Review of Systems  Constitutional: Negative for chills and fever.  HENT: Negative for congestion and sore throat.   Eyes: Negative for visual disturbance.  Respiratory: Negative for cough and shortness of breath.   Cardiovascular: Negative for chest pain.  Gastrointestinal: Positive for abdominal pain. Negative for diarrhea, nausea and vomiting.  Genitourinary: Negative for dysuria, frequency, hematuria, menstrual problem, urgency, vaginal bleeding, vaginal discharge and vaginal pain.  Musculoskeletal: Negative for back pain and neck pain.  Skin: Negative for rash.  Neurological: Negative for headaches.     Physical Exam Updated Vital Signs BP 117/75   Pulse 77   Temp 98.2 F (36.8 C) (Oral)   Resp 20   LMP 02/18/2016 (Approximate)   SpO2 100%   Physical Exam  Constitutional: She  appears well-developed and well-nourished. No distress.  Non-toxic appearing.   HENT:  Head: Normocephalic and atraumatic.  Mouth/Throat: Oropharynx is clear and moist.  Eyes: Conjunctivae are normal. Pupils are equal, round, and reactive to light. Right eye exhibits no discharge. Left eye exhibits no discharge.  Neck: Neck supple.  Cardiovascular: Normal rate, regular rhythm, normal heart sounds and intact distal pulses.  Exam reveals no gallop and no friction rub.   No murmur heard. Pulmonary/Chest: Effort normal and breath sounds normal. No respiratory distress. She has no wheezes. She has no  rales.  Abdominal: Soft. Bowel sounds are normal. She exhibits no distension and no mass. There is tenderness. There is no rebound and no guarding.  Abdomen is soft, bowel sounds are present. Mild suprapubic tenderness to palpation. No peritoneal signs. No CVA or flank tenderness. No adnexal TTP.   Musculoskeletal: She exhibits no edema.  Lymphadenopathy:    She has no cervical adenopathy.  Neurological: She is alert. Coordination normal.  Skin: Skin is warm and dry. No rash noted. She is not diaphoretic. No erythema. No pallor.  Psychiatric: She has a normal mood and affect. Her behavior is normal.  Nursing note and vitals reviewed.    ED Treatments / Results  DIAGNOSTIC STUDIES: Oxygen Saturation is 99% on RA, normal by my interpretation.    COORDINATION OF CARE: 1:42 AM Discussed treatment plan with pt at bedside and pt agreed to plan, which includes a [place].   Labs (all labs ordered are listed, but only abnormal results are displayed) Labs Reviewed  COMPREHENSIVE METABOLIC PANEL - Abnormal; Notable for the following:       Result Value   CO2 21 (*)    All other components within normal limits  CBC - Abnormal; Notable for the following:    WBC 14.1 (*)    All other components within normal limits  I-STAT BETA HCG BLOOD, ED ([facility], [facility], [facility] ONLY) - Abnormal; Notable for the following:    I-stat hCG, quantitative 372.7 (*)    All other components within normal limits  LIPASE, BLOOD  URINALYSIS, ROUTINE W REFLEX MICROSCOPIC    EKG  EKG Interpretation None       Radiology US Ob Comp < 14 Wks  Result Date: 06/13/2016 CLINICAL DATA:  Pelvic pain EXAM: OBSTETRIC <14 WK [place] AND TRANSVAGINAL OB US TECHNIQUE: Both transabdominal and transvaginal ultrasound examinations were performed for complete evaluation of the gestation as well as the maternal uterus, adnexal regions, and pelvic cul-de-sac. Transvaginal technique was performed to assess early pregnancy. COMPARISON:  None. FINDINGS:  Intrauterine gestational sac: Not visualized Yolk sac:  Not visualized Embryo:  Not visualized Subchorionic hemorrhage:  None visualized. Maternal uterus/adnexae: Bilateral ovaries are within normal limits. The right ovary measures 2.1 x 2.5 x 2.2 cm. The left ovary measures 3.1 x 1.4 x 1.9 cm. No significant free fluid. IMPRESSION: 1. No intrauterine pregnancy identified. Findings consistent with pregnancy of uncertain location ; differential considerations include recent miscarriage, intrauterine pregnancy too early to visualize, or occult ectopic pregnancy. Correlation with serial HCG advised, repeat ultrasound as clinically indicated. Electronically Signed   By: Donavan Foil M.D.   On: 06/13/2016 03:22   US Ob Transvaginal  Result Date: 06/13/2016 CLINICAL DATA:  Pelvic pain EXAM: OBSTETRIC <14 WK [place] AND TRANSVAGINAL OB US TECHNIQUE: Both transabdominal and transvaginal ultrasound examinations were performed for complete evaluation of the gestation as well as the maternal uterus, adnexal regions, and pelvic cul-de-sac. Transvaginal technique was performed to assess  early pregnancy. COMPARISON:  None. FINDINGS: Intrauterine gestational sac: Not visualized Yolk sac:  Not visualized Embryo:  Not visualized Subchorionic hemorrhage:  None visualized. Maternal uterus/adnexae: Bilateral ovaries are within normal limits. The right ovary measures 2.1 x 2.5 x 2.2 cm. The left ovary measures 3.1 x 1.4 x 1.9 cm. No significant free fluid. IMPRESSION: 1. No intrauterine pregnancy identified. Findings consistent with pregnancy of uncertain location ; differential considerations include recent miscarriage, intrauterine pregnancy too early to visualize, or occult ectopic pregnancy. Correlation with serial HCG advised, repeat ultrasound as clinically indicated. Electronically Signed   By: Donavan Foil M.D.   On: 06/13/2016 03:22    Procedures Procedures (including critical care time)  Medications Ordered in  ED Medications - No data to display   Initial Impression / Assessment and Plan / ED Course  I have reviewed the triage vital signs and the nursing notes.  Pertinent labs & imaging results that were available during my care of the patient were reviewed by me and considered in my medical decision making (see chart for details).    This is a 31 y.o. female  who presents to the Emergency Department complaining of abdominal cramping that began at 4 pm yesterday. Patient reports going to her OB earlier today, in which she was told that she is pregnant and they estimate [redacted] weeks pregnant based on LMP of 02/18/16.   Patient has had a miscarriage in the past. She denies vaginal bleeding or discharge.  On examination he is afebrile nontoxic appearing. Abdomen is soft and she has mild suprapubic tenderness to palpation. No adnexal tenderness. No peritoneal signs. Urinalysis is without signs of infection. CBC is remarkable for white count of 14,000. CMP is unremarkable. Quantitative hCG is 372. This is lower than would be expected for a 7 week pregnancy. Pelvic ultrasound was obtained and did not reveal intrauterine pregnancy. Pregnancy of uncertain location. This could be recent miscarriage, early pregnancy or occult ectopic. I have low suspicion for ectopic pregnancy. Patient has normal vital signs and has only mild pain. Reevaluation patient's abdominal exam is benign. She complains of mild 3 out of 10 pain currently. No adnexal pain. I discussed the results. I encouraged her to follow-up in 48 hours at the MAU at [facility] hospital for repeat quantitative hCG. Return sooner if vaginal bleeding, lightheadedness, worsening pain or new symptoms or concerns. Patient agrees with this plan. I advised the patient to follow-up with their primary care provider this week. I advised the patient to return to the emergency department with new or worsening symptoms or new concerns. The patient verbalized understanding and  agreement with plan.     Final Clinical Impressions(s) / ED Diagnoses   Final diagnoses:  Pregnancy of unknown anatomic location  Suprapubic pain    New Prescriptions New Prescriptions   No medications on file   I personally performed the services described in this documentation, which was scribed in my presence. The recorded information has been reviewed and is accurate.      Waynetta Pean, PA-C [id number]    Delora Fuel, MD 09/38/18 0630

## 2016-06-15 ENCOUNTER — Encounter (HOSPITAL_COMMUNITY): Payer: Self-pay | Admitting: *Deleted

## 2016-06-15 ENCOUNTER — Inpatient Hospital Stay (HOSPITAL_COMMUNITY)
Admission: AD | Admit: 2016-06-15 | Discharge: 2016-06-15 | Disposition: A | Discharge status: Home / Self Care | Payer: Commercial Managed Care - HMO | Source: Ambulatory Visit | Attending: Obstetrics & Gynecology | Admitting: Obstetrics & Gynecology

## 2016-06-15 ENCOUNTER — Other Ambulatory Visit: Payer: Self-pay | Admitting: Student

## 2016-06-15 DIAGNOSIS — Z3A01 Less than 8 weeks gestation of pregnancy: Secondary | ICD-10-CM | POA: Diagnosis not present

## 2016-06-15 DIAGNOSIS — R102 Pelvic and perineal pain: Secondary | ICD-10-CM | POA: Insufficient documentation

## 2016-06-15 DIAGNOSIS — O9989 Other specified diseases and conditions complicating pregnancy, childbirth and the puerperium: Secondary | ICD-10-CM

## 2016-06-15 DIAGNOSIS — R109 Unspecified abdominal pain: Secondary | ICD-10-CM | POA: Diagnosis not present

## 2016-06-15 DIAGNOSIS — O3680X0 Pregnancy with inconclusive fetal viability, not applicable or unspecified: Secondary | ICD-10-CM

## 2016-06-15 DIAGNOSIS — O26891 Other specified pregnancy related conditions, first trimester: Secondary | ICD-10-CM | POA: Diagnosis present

## 2016-06-15 LAB — HCG, QUANTITATIVE, PREGNANCY: hCG, Beta Chain, Quant, S: 1099 m[IU]/mL — ABNORMAL HIGH (ref <5)

## 2016-06-15 NOTE — Discharge Instructions (Signed)
Ectopic Pregnancy An ectopic pregnancy is when the fertilized egg attaches (implants) outside the uterus. Most ectopic pregnancies occur in one of the tubes where eggs travel from the ovary to the uterus (fallopian tubes), but the implanting can occur in other locations. In rare cases, ectopic pregnancies occur on the ovary, intestine, pelvis, abdomen, or cervix. In an ectopic pregnancy, the fertilized egg does not have the ability to develop into a normal, healthy baby. A ruptured ectopic pregnancy is one in which tearing or bursting of a fallopian tube causes internal bleeding. Often, there is intense lower abdominal pain, and vaginal bleeding sometimes occurs. Having an ectopic pregnancy can be life-threatening. If this dangerous condition is not treated, it can lead to blood loss, shock, or even death. What are the causes? The most common cause of this condition is damage to one of the fallopian tubes. A fallopian tube may be narrowed or blocked, and that keeps the fertilized egg from reaching the uterus. What increases the risk? This condition is more likely to develop in women of childbearing age who have different levels of risk. The levels of risk can be divided into three categories. High risk  You have gone through infertility treatment.  You have had an ectopic pregnancy before.  You have had surgery on the fallopian tubes, or another surgical procedure, such as an abortion.  You have had surgery to have the fallopian tubes tied (tubal ligation).  You have problems or diseases of the fallopian tubes.  You have been exposed to diethylstilbestrol (DES). This medicine was used until 1971, and it had effects on babies whose mothers took the medicine.  You become pregnant while using an IUD (intrauterine device) for birth control. Moderate risk  You have a history of infertility.  You have had an STI (sexually transmitted infection).  You have a history of pelvic inflammatory  disease (PID).  You have scarring from endometriosis.  You have multiple sexual partners.  You smoke. Low risk  You have had pelvic surgery.  You use vaginal douches.  You became sexually active before age 18. What are the signs or symptoms? Common symptoms of this condition include normal pregnancy symptoms, such as missing a period, nausea, tiredness, abdominal pain, breast tenderness, and bleeding. However, ectopic pregnancy will have additional symptoms, such as:  Pain with intercourse.  Irregular vaginal bleeding or spotting.  Cramping or pain on one side or in the lower abdomen.  Fast heartbeat, low blood pressure, and sweating.  Passing out while having a bowel movement.  Symptoms of a ruptured ectopic pregnancy and internal bleeding may include:  Sudden, severe pain in the abdomen and pelvis.  Dizziness, weakness, light-headedness, or fainting.  Pain in the shoulder or neck area.  How is this diagnosed? This condition is diagnosed by:  A pelvic exam to locate pain or a mass in the abdomen.  A pregnancy test. This blood test checks for the presence as well as the specific level of pregnancy hormone in the bloodstream.  Ultrasound. This is performed if a pregnancy test is positive. In this test, a probe is inserted into the vagina. The probe will detect a fetus, possibly in a location other than the uterus.  Taking a sample of uterus tissue (dilation and curettage, or D&C).  Surgery to perform a visual exam of the inside of the abdomen using a thin, lighted tube that has a tiny camera on the end (laparoscope).  Culdocentesis. This procedure involves inserting a needle at the top   of the vagina, behind the uterus. If blood is present in this area, it may indicate that a fallopian tube is torn.  How is this treated? This condition is treated with medicine or surgery. Medicine  An injection of a medicine (methotrexate) may be given to cause the pregnancy tissue  to be absorbed. This medicine may save your fallopian tube. It may be given if: ? The diagnosis is made early, with no signs of active bleeding. ? The fallopian tube has not ruptured. ? You are considered to be a good candidate for the medicine. Usually, pregnancy hormone blood levels are checked after methotrexate treatment. This is to be sure that the medicine is effective. It may take 4-6 weeks for the pregnancy to be absorbed. Most pregnancies will be absorbed by 3 weeks. Surgery  A laparoscope may be used to remove the pregnancy tissue.  If severe internal bleeding occurs, a larger cut (incision) may be made in the lower abdomen (laparotomy) to remove the fetus and placenta. This is done to stop the bleeding.  Part or all of the fallopian tube may be removed (salpingectomy) along with the fetus and placenta. The fallopian tube may also be repaired during the surgery.  In very rare circumstances, removal of the uterus (hysterectomy) may be required.  After surgery, pregnancy hormone testing may be done to be sure that there is no pregnancy tissue left. Whether your treatment is medicine or surgery, you may receive a Rho (D) immune globulin shot to prevent problems with any future pregnancy. This shot may be given if:  You are Rh-negative and the baby's father is Rh-positive.  You are Rh-negative and you do not know the Rh type of the baby's father.  Follow these instructions at home:  Rest and limit your activity after the procedure for as long as told by your health care provider.  Until your health care provider says that it is safe: ? Do not lift anything that is heavier than 10 lb (4.5 kg), or the limit that your health care provider tells you. ? Avoid physical exercise and any movement that requires effort (is strenuous).  To help prevent constipation: ? Eat a healthy diet that includes fruits, vegetables, and whole grains. ? Drink 6-8 glasses of water per day. Get help  right away if:  You develop worsening pain that is not relieved by medicine.  You have: ? A fever or chills. ? Vaginal bleeding. ? Redness and swelling at the incision site. ? Nausea and vomiting.  You feel dizzy or weak.  You feel light-headed or you faint. This information is not intended to replace advice given to you by your health care provider. Make sure you discuss any questions you have with your health care provider. Document Released: 02/07/2004 Document Revised: 08/29/2015 Document Reviewed: 08/01/2015 Elsevier Interactive Patient Education  2018 [employer]  

## 2016-06-15 NOTE — MAU Provider Note (Signed)
Patient here for follow up beta from 06/13/2016; beta was 327 on 06-13-2016 and now is over 1000. Patient to follow up with [place] in two weeks. Patient has mild cramping.  Strict ectopic precautions given.  Patient says that she has first prenatal visit with [facility] tomorrow; recommend that patient keep her first prenatal visit with [employer] tomorrow.  Maye Hides CNM

## 2016-06-15 NOTE — MAU Note (Signed)
Found out she was preg last Thurs.  Was told to return in 48hrs because HCG level was low.  Was cramping then, still cramping.Jessica Mcpherson

## 2016-06-22 ENCOUNTER — Inpatient Hospital Stay (HOSPITAL_COMMUNITY)
Admission: AD | Admit: 2016-06-22 | Discharge: 2016-06-22 | Disposition: A | Discharge status: Home / Self Care | Payer: Commercial Managed Care - HMO | Source: Ambulatory Visit | Attending: Obstetrics & Gynecology | Admitting: Obstetrics & Gynecology

## 2016-06-22 DIAGNOSIS — O26891 Other specified pregnancy related conditions, first trimester: Secondary | ICD-10-CM | POA: Diagnosis not present

## 2016-06-22 DIAGNOSIS — B9789 Other viral agents as the cause of diseases classified elsewhere: Secondary | ICD-10-CM

## 2016-06-22 DIAGNOSIS — Z3A08 8 weeks gestation of pregnancy: Secondary | ICD-10-CM | POA: Diagnosis not present

## 2016-06-22 DIAGNOSIS — F17210 Nicotine dependence, cigarettes, uncomplicated: Secondary | ICD-10-CM | POA: Insufficient documentation

## 2016-06-22 DIAGNOSIS — O9989 Other specified diseases and conditions complicating pregnancy, childbirth and the puerperium: Secondary | ICD-10-CM

## 2016-06-22 DIAGNOSIS — O99511 Diseases of the respiratory system complicating pregnancy, first trimester: Secondary | ICD-10-CM | POA: Diagnosis not present

## 2016-06-22 DIAGNOSIS — Z880 Allergy status to penicillin: Secondary | ICD-10-CM | POA: Insufficient documentation

## 2016-06-22 DIAGNOSIS — R509 Fever, unspecified: Secondary | ICD-10-CM | POA: Insufficient documentation

## 2016-06-22 DIAGNOSIS — O99331 Smoking (tobacco) complicating pregnancy, first trimester: Secondary | ICD-10-CM | POA: Diagnosis not present

## 2016-06-22 DIAGNOSIS — J029 Acute pharyngitis, unspecified: Secondary | ICD-10-CM | POA: Insufficient documentation

## 2016-06-22 DIAGNOSIS — J069 Acute upper respiratory infection, unspecified: Secondary | ICD-10-CM

## 2016-06-22 NOTE — MAU Provider Note (Signed)
History     CSN: [id number]  Arrival date and time: 06/22/16 2000   First Provider Initiated Contact with Patient 06/22/16 2037      Chief Complaint  Patient presents with  . Fever  . Sore Throat   Patient is a 31 year old G1 P0 at 8 weeks 0 days gestation by early ultrasound presenting for cough and nasal congestion that started yesterday. She denies vaginal bleeding or vaginal discharge. She reports occasionally she may feel small abdominal cramping. She reports some subjective fevers and chills but has never had a temperature higher than 45F. She reports she is coughing up some mucus and has a history of asthma she has been using her inhaler. She has not taken anything else over-the-counter she is unsure what was safe. She reports she has not had significant rhinorrhea just feels congested.    OB History    Gravida Para Term Preterm AB Living   1             SAB TAB Ectopic Multiple Live Births                  Past Medical History:  Diagnosis Date  . Bronchitis   . Miscarriage     Past Surgical History:  Procedure Laterality Date  . APPENDECTOMY    . CESAREAN SECTION    . LAPAROSCOPIC APPENDECTOMY N/A 12/30/2013   Procedure: APPENDECTOMY LAPAROSCOPIC;  Surgeon: Jackolyn Confer, MD;  Location: [facility];  Service: General;  Laterality: N/A;    Family History  Problem Relation Age of Onset  . Diabetes Mother   . Hypertension Mother   . Diabetes Maternal Grandmother   . Hypertension Maternal Grandmother   . Diabetes Maternal Grandfather   . Hypertension Father     Social History  Substance Use Topics  . Smoking status: Current Every Day Smoker    Types: Cigarettes  . Smokeless tobacco: Never Used     Comment: has not smoked for 3 days  . Alcohol use No    Allergies:  Allergies  Allergen Reactions  . Penicillins Hives and Other (See Comments)    Has patient had a PCN reaction causing immediate rash, facial/tongue/throat swelling, SOB or lightheadedness  with hypotension: No Has patient had a PCN reaction causing severe rash involving mucus membranes or skin necrosis: No Has patient had a PCN reaction that required hospitalization: No Has patient had a PCN reaction occurring within the last 10 years: No If all of the above answers are "NO", then may proceed with Cephalosporin use.  Marland Kitchen Pepperoni [Pickled Meat] Hives    Prescriptions Prior to Admission  Medication Sig Dispense Refill Last Dose  . Prenat-FeAsp-Meth-FA-DHA w/o A (PRENATE PIXIE) 10-0.6-0.4-200 MG CAPS Take 1 capsule by mouth daily.   06/12/2016 at Unknown time    Review of Systems  Constitutional: Positive for chills. Negative for fever.  HENT: Positive for congestion, sinus pressure and sore throat. Negative for rhinorrhea.   Respiratory: Positive for cough and shortness of breath.   Cardiovascular: Negative for chest pain and palpitations.  Gastrointestinal: Negative for abdominal distention, abdominal pain, constipation, diarrhea, nausea and vomiting.  Genitourinary: Negative for difficulty urinating, dysuria and frequency.  Musculoskeletal: Positive for myalgias. Negative for arthralgias and back pain.  Neurological: Negative for dizziness and weakness.   Physical Exam   Blood pressure 137/84, pulse 82, temperature 99.1 F (37.3 C), temperature source Oral, resp. rate 16, height 5' (1.524 m), weight 178 lb (80.7 kg), last menstrual period 04/27/2016, SpO2  100 %.  Physical Exam  Constitutional: She is oriented to person, place, and time. She appears well-developed and well-nourished.  HENT:  Head: Normocephalic and atraumatic.  Mouth/Throat: Oropharynx is clear and moist.  Eyes: Conjunctivae are normal. Pupils are equal, round, and reactive to light.  Neck: Normal range of motion. Neck supple. No thyromegaly present.  Cardiovascular: Normal rate, regular rhythm, normal heart sounds and intact distal pulses.  Exam reveals no friction rub.   No murmur  heard. Respiratory: Effort normal and breath sounds normal. No respiratory distress. She has no wheezes.  GI: Soft. She exhibits no distension. There is no tenderness. There is no rebound and no guarding.  Musculoskeletal: Normal range of motion. She exhibits no edema.  Lymphadenopathy:    She has cervical adenopathy.  Neurological: She is alert and oriented to person, place, and time. No cranial nerve deficit.  Skin: Skin is warm and dry. No erythema.  Psychiatric: She has a normal mood and affect. Her behavior is normal.    MAU Course  Procedures  MDM In MA U patient underwent medical screening. No signs or symptoms consistent with pneumonia or bacterial infection. No posterior pharyngeal erythema was noted and no exudates noted on the tonsils. Patient can be discharged home with supportive care. She was given a list of over-the-counter medication safe in pregnancy.  Assessment and Plan  #1: Viral URI supportive care list of safe over-the-counter medications given to patient.  Jacquiline Doe 06/22/2016, 8:44 PM

## 2016-06-22 NOTE — Discharge Instructions (Signed)
Cough, Adult Coughing is a reflex that clears your throat and your airways. Coughing helps to heal and protect your lungs. It is normal to cough occasionally, but a cough that happens with other symptoms or lasts a long time may be a sign of a condition that needs treatment. A cough may last only 2-3 weeks (acute), or it may last longer than 8 weeks (chronic). What are the causes? Coughing is commonly caused by:  Breathing in substances that irritate your lungs.  A viral or bacterial respiratory infection.  Allergies.  Asthma.  Postnasal drip.  Smoking.  Acid backing up from the stomach into the esophagus (gastroesophageal reflux).  Certain medicines.  Chronic lung problems, including COPD (or rarely, lung cancer).  Other medical conditions such as heart failure.  Follow these instructions at home: Pay attention to any changes in your symptoms. Take these actions to help with your discomfort:  Take medicines only as told by your health care provider. ? If you were prescribed an antibiotic medicine, take it as told by your health care provider. Do not stop taking the antibiotic even if you start to feel better. ? Talk with your health care provider before you take a cough suppressant medicine.  Drink enough fluid to keep your urine clear or pale yellow.  If the air is dry, use a cold steam vaporizer or humidifier in your bedroom or your home to help loosen secretions.  Avoid anything that causes you to cough at work or at home.  If your cough is worse at night, try sleeping in a semi-upright position.  Avoid cigarette smoke. If you smoke, quit smoking. If you need help quitting, ask your health care provider.  Avoid caffeine.  Avoid alcohol.  Rest as needed.  Contact a health care provider if:  You have new symptoms.  You cough up pus.  Your cough does not get better after 2-3 weeks, or your cough gets worse.  You cannot control your cough with suppressant  medicines and you are losing sleep.  You develop pain that is getting worse or pain that is not controlled with pain medicines.  You have a fever.  You have unexplained weight loss.  You have night sweats. Get help right away if:  You cough up blood.  You have difficulty breathing.  Your heartbeat is very fast. This information is not intended to replace advice given to you by your health care provider. Make sure you discuss any questions you have with your health care provider. Document Released: 06/28/2010 Document Revised: 06/07/2015 Document Reviewed: 03/08/2014 Elsevier Interactive Patient Education  2017 Elsevier Inc.  

## 2016-06-22 NOTE — MAU Note (Signed)
Patient presents to MAU with c/o of fever, sore throat, body aches. Patient states she starting feeling sick before work- took tylenol but started feeling worse at work and started sweating. HA, body aches and abdominal cramping started today. Denies VB.

## 2016-06-25 ENCOUNTER — Inpatient Hospital Stay (HOSPITAL_COMMUNITY): Payer: Commercial Managed Care - HMO

## 2016-06-25 ENCOUNTER — Inpatient Hospital Stay (HOSPITAL_COMMUNITY)
Admission: AD | Admit: 2016-06-25 | Discharge: 2016-06-25 | Disposition: A | Discharge status: Home / Self Care | Payer: Commercial Managed Care - HMO | Source: Ambulatory Visit | Attending: Obstetrics and Gynecology | Admitting: Obstetrics and Gynecology

## 2016-06-25 ENCOUNTER — Encounter (HOSPITAL_COMMUNITY): Payer: Self-pay | Admitting: *Deleted

## 2016-06-25 DIAGNOSIS — O209 Hemorrhage in early pregnancy, unspecified: Secondary | ICD-10-CM | POA: Diagnosis not present

## 2016-06-25 DIAGNOSIS — F17210 Nicotine dependence, cigarettes, uncomplicated: Secondary | ICD-10-CM | POA: Insufficient documentation

## 2016-06-25 DIAGNOSIS — Z880 Allergy status to penicillin: Secondary | ICD-10-CM | POA: Insufficient documentation

## 2016-06-25 DIAGNOSIS — O039 Complete or unspecified spontaneous abortion without complication: Secondary | ICD-10-CM | POA: Insufficient documentation

## 2016-06-25 DIAGNOSIS — N939 Abnormal uterine and vaginal bleeding, unspecified: Secondary | ICD-10-CM

## 2016-06-25 DIAGNOSIS — O3680X0 Pregnancy with inconclusive fetal viability, not applicable or unspecified: Secondary | ICD-10-CM

## 2016-06-25 LAB — WET PREP, GENITAL
Clue Cells Wet Prep HPF POC: NONE SEEN
Sperm: NONE SEEN
Trich, Wet Prep: NONE SEEN
WBC, Wet Prep HPF POC: NONE SEEN
Yeast Wet Prep HPF POC: NONE SEEN

## 2016-06-25 LAB — CBC
HCT: 39.4 % (ref 36.0–46.0)
Hemoglobin: 13.2 g/dL (ref 12.0–15.0)
MCH: 29.5 pg (ref 26.0–34.0)
MCHC: 33.5 g/dL (ref 30.0–36.0)
MCV: 88.1 fL (ref 78.0–100.0)
Platelets: 302 10*3/uL (ref 150–400)
RBC: 4.47 MIL/uL (ref 3.87–5.11)
RDW: 13.7 % (ref 11.5–15.5)
WBC: 12.8 10*3/uL — ABNORMAL HIGH (ref 4.0–10.5)

## 2016-06-25 LAB — URINALYSIS, MICROSCOPIC (REFLEX)

## 2016-06-25 LAB — GC/CHLAMYDIA PROBE AMP (~~LOC~~) NOT AT ARMC
Chlamydia: NEGATIVE
Neisseria Gonorrhea: NEGATIVE

## 2016-06-25 LAB — HCG, QUANTITATIVE, PREGNANCY: hCG, Beta Chain, Quant, S: 9960 m[IU]/mL — ABNORMAL HIGH (ref <5)

## 2016-06-25 LAB — URINALYSIS, ROUTINE W REFLEX MICROSCOPIC

## 2016-06-25 LAB — ABO/RH: ABO/RH(D): O POS

## 2016-06-25 MED ORDER — OXYCODONE-ACETAMINOPHEN 5-325 MG PO TABS
1.0000 | ORAL_TABLET | ORAL | Status: DC | PRN
  Administered 2016-06-25: 1 via ORAL
  Filled 2016-06-25: qty 1

## 2016-06-25 NOTE — MAU Provider Note (Signed)
History     CSN: [id number]  Arrival date and time: 06/25/16 8413   First Provider Initiated Contact with Patient 06/25/16 0430      Chief Complaint  Patient presents with  . Vaginal Bleeding   Patient is a 59 y G4P1 @ 8wk 3d who presents to MAU with some mild cramping and vaginal bleeding. She had a cold a few days ago which was completely resolved at this point. She states she was woken from sleep with abdominal cramping at about 3AM. She reports at that time she got up and went to the bathroom. She had period like bleeding at that time. She denies any recent sexual intercourse or anything in the vagina.    OB History    Gravida Para Term Preterm AB Living   4 1 1   2 1    SAB TAB Ectopic Multiple Live Births   1       1      Past Medical History:  Diagnosis Date  . Bronchitis   . Miscarriage     Past Surgical History:  Procedure Laterality Date  . APPENDECTOMY    . CESAREAN SECTION    . LAPAROSCOPIC APPENDECTOMY N/A 12/30/2013   Procedure: APPENDECTOMY LAPAROSCOPIC;  Surgeon: Jackolyn Confer, MD;  Location: [facility];  Service: General;  Laterality: N/A;    Family History  Problem Relation Age of Onset  . Diabetes Mother   . Hypertension Mother   . Diabetes Maternal Grandmother   . Hypertension Maternal Grandmother   . Diabetes Maternal Grandfather   . Hypertension Father     Social History  Substance Use Topics  . Smoking status: Current Every Day Smoker    Types: Cigarettes  . Smokeless tobacco: Never Used     Comment: has not smoked for 3 days  . Alcohol use No    Allergies:  Allergies  Allergen Reactions  . Penicillins Hives and Other (See Comments)    Has patient had a PCN reaction causing immediate rash, facial/tongue/throat swelling, SOB or lightheadedness with hypotension: No Has patient had a PCN reaction causing severe rash involving mucus membranes or skin necrosis: No Has patient had a PCN reaction that required hospitalization: No Has  patient had a PCN reaction occurring within the last 10 years: No If all of the above answers are "NO", then may proceed with Cephalosporin use.  Marland Kitchen Pepperoni [Pickled Meat] Hives    Prescriptions Prior to Admission  Medication Sig Dispense Refill Last Dose  . Prenat-FeAsp-Meth-FA-DHA w/o A (PRENATE PIXIE) 10-0.6-0.4-200 MG CAPS Take 1 capsule by mouth daily.   06/12/2016 at Unknown time    Review of Systems  Constitutional: Negative for chills and fever.  HENT: Negative for congestion and rhinorrhea.   Respiratory: Negative for cough and shortness of breath.   Cardiovascular: Negative for chest pain and palpitations.  Gastrointestinal: Positive for abdominal pain. Negative for abdominal distention, constipation, diarrhea, nausea and vomiting.  Genitourinary: Positive for vaginal bleeding. Negative for difficulty urinating, dysuria and frequency.  Musculoskeletal: Negative for back pain and myalgias.  Neurological: Negative for dizziness and weakness.  Psychiatric/Behavioral: Negative for agitation.   Physical Exam   Blood pressure 113/72, pulse 86, temperature 98.5 F (36.9 C), temperature source Oral, resp. rate 20, height 5' (1.524 m), weight 180 lb 12 oz (82 kg), last menstrual period 04/27/2016.  Physical Exam  Vitals reviewed. Constitutional: She is oriented to person, place, and time. She appears well-developed and well-nourished.  HENT:  Head: Normocephalic and atraumatic.  Cardiovascular: Normal rate and regular rhythm.   Respiratory: Effort normal and breath sounds normal. No respiratory distress.  GI: Soft. Bowel sounds are normal. She exhibits no distension. There is tenderness. There is no rebound and no guarding.  Musculoskeletal: Normal range of motion. She exhibits no edema.  Neurological: She is alert and oriented to person, place, and time. No cranial nerve deficit.  Skin: Skin is warm and dry.  Psychiatric: She has a normal mood and affect. Her behavior is  normal.    MAU Course  Procedures  MDM  IN MAU patient underwent evaluation with repeat Beta Hcg and [place]. US showed no finding in the uterus. bHCG is 9000 which is greater than last check, but less than would be expected. CBC is normal. PAtient is Rh +  Wet prep and GC/CT preformed. Wet prep negative.   Assessment and Plan  #1: Pregnancy of unknown location - likely spontaneous abortion, but will recheck beta in 48 hours to confirm. IBU 800mg  q6hrs for pain.   Jessica Mcpherson 06/25/2016, 4:32 AM

## 2016-06-25 NOTE — Discharge Instructions (Signed)
Ectopic Pregnancy °An ectopic pregnancy happens when a fertilized egg grows outside the uterus. A pregnancy cannot live outside of the uterus. This problem often happens in the fallopian tube. It is often caused by damage to the fallopian tube. °If this problem is found early, you may be treated with medicine. If your tube tears or bursts open (ruptures), you will bleed inside. This is an emergency. You will need surgery. Get help right away. °What are the signs or symptoms? °You may have normal pregnancy symptoms at first. These include: °· Missing your period. °· Feeling sick to your stomach (nauseous). °· Being tired. °· Having tender breasts. ° °Then, you may start to have symptoms that are not normal. These include: °· Pain with sex (intercourse). °· Bleeding from the vagina. This includes light bleeding (spotting). °· Belly (abdomen) or lower belly cramping or pain. This may be felt on one side. °· A fast heartbeat (pulse). °· Passing out (fainting) after going poop (bowel movement). ° °If your tube tears, you may have symptoms such as: °· Really bad pain in the belly or lower belly. This happens suddenly. °· Dizziness. °· Passing out. °· Shoulder pain. ° °Get help right away if: °You have any of these symptoms. This is an emergency. °This information is not intended to replace advice given to you by your health care provider. Make sure you discuss any questions you have with your health care provider. °Document Released: 03/28/2008 Document Revised: 06/07/2015 Document Reviewed: 08/11/2012 °Elsevier Interactive Patient Education © 2017 Elsevier Inc. ° °

## 2016-06-25 NOTE — MAU Note (Signed)
Pt says  She went to [facility] on 5-30 for positive UPT,  Then  Came here on  Sunday 6-3-   For labs.  Then   She went  To Dr's office  For an U?S.  Tonight  At 12 MN  She felt cramps  And at 0300- saw bleeding  At same time as voiding.

## 2016-07-22 ENCOUNTER — Encounter: Payer: Self-pay | Admitting: General Practice

## 2016-07-22 ENCOUNTER — Encounter: Payer: Commercial Managed Care - HMO | Admitting: Obstetrics and Gynecology

## 2016-09-13 ENCOUNTER — Emergency Department (HOSPITAL_COMMUNITY)
Admission: EM | Admit: 2016-09-13 | Discharge: 2016-09-13 | Disposition: A | Discharge status: Home / Self Care | Payer: 59 | Attending: Emergency Medicine | Admitting: Emergency Medicine

## 2016-09-13 ENCOUNTER — Encounter (HOSPITAL_COMMUNITY): Payer: Self-pay | Admitting: *Deleted

## 2016-09-13 DIAGNOSIS — Y939 Activity, unspecified: Secondary | ICD-10-CM | POA: Insufficient documentation

## 2016-09-13 DIAGNOSIS — Y929 Unspecified place or not applicable: Secondary | ICD-10-CM | POA: Diagnosis not present

## 2016-09-13 DIAGNOSIS — X58XXXA Exposure to other specified factors, initial encounter: Secondary | ICD-10-CM | POA: Diagnosis not present

## 2016-09-13 DIAGNOSIS — S39012A Strain of muscle, fascia and tendon of lower back, initial encounter: Secondary | ICD-10-CM | POA: Diagnosis not present

## 2016-09-13 DIAGNOSIS — F17210 Nicotine dependence, cigarettes, uncomplicated: Secondary | ICD-10-CM | POA: Insufficient documentation

## 2016-09-13 DIAGNOSIS — Z79899 Other long term (current) drug therapy: Secondary | ICD-10-CM | POA: Diagnosis not present

## 2016-09-13 DIAGNOSIS — Y999 Unspecified external cause status: Secondary | ICD-10-CM | POA: Diagnosis not present

## 2016-09-13 MED ORDER — CYCLOBENZAPRINE HCL 10 MG PO TABS: 5.0000 mg | ORAL_TABLET | Freq: Three times a day (TID) | ORAL | 0 refills | Status: DC | PRN

## 2016-09-13 NOTE — ED Provider Notes (Signed)
[facility] DEPT Provider Note   CSN: [id number] Arrival date & time: 09/13/16  0940     History   Chief Complaint Chief Complaint  Patient presents with  . Back Pain    HPI Jessica Mcpherson is a 31 y.o. female.Patient developed low nonradiating back pain onset yesterday 6:30 PM, 30 minutes after she completed her work day. Pain is worse with changing positions improved with remaining still. Pain is nonradiating. No loss of bladder bowel control no fever, no trauma no other associated symptoms. No treatment prior to coming here  HPI  Past Medical History:  Diagnosis Date  . Bronchitis   . Miscarriage     Patient Active Problem List   Diagnosis Date Noted  . Acute appendicitis 12/30/2013    Past Surgical History:  Procedure Laterality Date  . APPENDECTOMY    . CESAREAN SECTION    . LAPAROSCOPIC APPENDECTOMY N/A 12/30/2013   Procedure: APPENDECTOMY LAPAROSCOPIC;  Surgeon: Jackolyn Confer, MD;  Location: [facility];  Service: General;  Laterality: N/A;    OB History    Gravida Para Term Preterm AB Living   4 1 1   2 1    SAB TAB Ectopic Multiple Live Births   1       1       Home Medications    Prior to Admission medications   Medication Sig Start Date End Date Taking? Authorizing Provider  Prenat-FeAsp-Meth-FA-DHA w/o A (PRENATE PIXIE) 10-0.6-0.4-200 MG CAPS Take 1 capsule by mouth daily.    [provider]    Family History Family History  Problem Relation Age of Onset  . Diabetes Mother   . Hypertension Mother   . Diabetes Maternal Grandmother   . Hypertension Maternal Grandmother   . Diabetes Maternal Grandfather   . Hypertension Father     Social History Social History  Substance Use Topics  . Smoking status: Current Every Day Smoker    Types: Cigarettes  . Smokeless tobacco: Never Used     Comment: has not smoked for 3 days  . Alcohol use No     Allergies   Penicillins and Pepperoni [pickled meat]   Review of Systems Review of  Systems  Constitutional: Negative.   Gastrointestinal: Negative.   Genitourinary:       Last menstrual period August 11, longer than normal after having had a miscarriage a few weeks prior  Musculoskeletal: Positive for back pain.  Neurological: Negative.      Physical Exam Updated Vital Signs BP 132/88   Pulse 79   Temp 98.2 F (36.8 C)   Resp 18   LMP 08/23/2016   SpO2 100%   Breastfeeding? Unknown   Physical Exam  Constitutional: She is oriented to person, place, and time. She appears well-developed and well-nourished.  HENT:  Head: Normocephalic and atraumatic.  Eyes: Pupils are equal, round, and reactive to light. Conjunctivae are normal.  Neck: Neck supple. No tracheal deviation present. No thyromegaly present.  Cardiovascular: Normal rate and regular rhythm.   No murmur heard. Pulmonary/Chest: Effort normal and breath sounds normal.  Abdominal: Soft. Bowel sounds are normal. She exhibits no distension. There is no tenderness.  Obese  Musculoskeletal: Normal range of motion. She exhibits no edema or tenderness.  Entire spine nontender. She has mild pain at lumbar area when she sits up from a seated position  Neurological: She is alert and oriented to person, place, and time. She displays normal reflexes. Coordination normal.  Gait normal DTRs symmetric bilaterally  at knee jerk ankle jerk and biceps toes downward going bilaterally  Skin: Skin is warm and dry. No rash noted.  Psychiatric: She has a normal mood and affect.  Nursing note and vitals reviewed.    ED Treatments / Results  Labs (all labs ordered are listed, but only abnormal results are displayed) Labs Reviewed - No data to display  EKG  EKG Interpretation None       Radiology No results found.  Procedures Procedures (including critical care time)  Medications Ordered in ED Medications - No data to display   Initial Impression / Assessment and Plan / ED Course  I have reviewed the triage  vital signs and the nursing notes.  Pertinent labs & imaging results that were available during my care of the patient were reviewed by me and considered in my medical decision making (see chart for details).     Plan prescription Flexeril. Ibuprofen and/or Tylenol for pain. Follow-up with PMD if not improving by next week.  Final Clinical Impressions(s) / ED Diagnoses  Diagnosis lumbar strain Final diagnoses:  None    New Prescriptions New Prescriptions   No medications on file     Orlie Dakin, MD 09/13/16 1008

## 2016-09-13 NOTE — Discharge Instructions (Signed)
Take Motrin or Advil for pain as directed. You can also take Tylenol as needed for pain. Take the medication prescribed as needed for spasm. See your primary care physician if not feeling better by next week. Return if concern for any reason

## 2016-09-13 NOTE — ED Notes (Signed)
Bed: [facility] Expected date:  Expected time:  Means of arrival:  Comments: 

## 2016-09-13 NOTE — ED Triage Notes (Addendum)
Pt complains of back pain since last nigh, worse with movement. Pt works at [employer], states she lifts heavy objects at work. Pt took tylenol at 830AM. Pain is now 7/10.

## 2016-12-29 ENCOUNTER — Emergency Department (HOSPITAL_COMMUNITY)
Admission: EM | Admit: 2016-12-29 | Discharge: 2016-12-29 | Discharge status: Against Medical Advice | Payer: 59 | Attending: Emergency Medicine | Admitting: Emergency Medicine

## 2016-12-29 ENCOUNTER — Emergency Department (HOSPITAL_COMMUNITY): Payer: 59

## 2016-12-29 ENCOUNTER — Encounter (HOSPITAL_COMMUNITY): Payer: Self-pay | Admitting: Emergency Medicine

## 2016-12-29 DIAGNOSIS — Z79899 Other long term (current) drug therapy: Secondary | ICD-10-CM | POA: Diagnosis not present

## 2016-12-29 DIAGNOSIS — R0602 Shortness of breath: Secondary | ICD-10-CM | POA: Diagnosis not present

## 2016-12-29 DIAGNOSIS — F17210 Nicotine dependence, cigarettes, uncomplicated: Secondary | ICD-10-CM | POA: Diagnosis not present

## 2016-12-29 LAB — I-STAT CHEM 8, ED
BUN: 9 mg/dL (ref 6–20)
Calcium, Ion: 1.17 mmol/L (ref 1.15–1.40)
Chloride: 103 mmol/L (ref 101–111)
Creatinine, Ser: 0.6 mg/dL (ref 0.44–1.00)
Glucose, Bld: 76 mg/dL (ref 65–99)
HCT: 43 % (ref 36.0–46.0)
Hemoglobin: 14.6 g/dL (ref 12.0–15.0)
Potassium: 4.1 mmol/L (ref 3.5–5.1)
Sodium: 138 mmol/L (ref 135–145)
TCO2: 26 mmol/L (ref 22–32)

## 2016-12-29 LAB — I-STAT TROPONIN, ED: Troponin i, poc: 0 ng/mL (ref 0.00–0.08)

## 2016-12-29 MED ORDER — NAPROXEN 375 MG PO TABS: 375.0000 mg | ORAL_TABLET | Freq: Two times a day (BID) | ORAL | 0 refills | Status: DC

## 2016-12-29 NOTE — ED Provider Notes (Signed)
[facility] DEPT Provider Note   CSN: [id number] Arrival date & time: 12/29/16  9833     History   Chief Complaint Chief Complaint  Patient presents with  . Shortness of Breath  . Cough    HPI Jessica Mcpherson is a 31 y.o. female.  The history is provided by the patient.  Shortness of Breath  This is a new problem. The average episode lasts 2 weeks. The problem occurs continuously.The problem has not changed since onset.Associated symptoms include cough. Pertinent negatives include no fever, no wheezing, no leg pain and no leg swelling. She has tried oral steroids and beta-agonist inhalers for the symptoms. The treatment provided no relief. Associated medical issues do not include PE, CAD or DVT.  Cough  Associated symptoms include shortness of breath. Pertinent negatives include no wheezing.  Today she developed pain in the left chest.  It is sharp. She has dull pain in her arm as well.   That has been constant all day.  No hx of hear or lung disease.  No pe or Dvt  Past Medical History:  Diagnosis Date  . Bronchitis   . Miscarriage     Patient Active Problem List   Diagnosis Date Noted  . Acute appendicitis 12/30/2013    Past Surgical History:  Procedure Laterality Date  . APPENDECTOMY    . CESAREAN SECTION    . LAPAROSCOPIC APPENDECTOMY N/A 12/30/2013   Procedure: APPENDECTOMY LAPAROSCOPIC;  Surgeon: Jackolyn Confer, MD;  Location: [facility];  Service: General;  Laterality: N/A;    OB History    Gravida Para Term Preterm AB Living   4 1 1   2 1    SAB TAB Ectopic Multiple Live Births   1       1       Home Medications    Prior to Admission medications   Medication Sig Start Date End Date Taking? Authorizing Provider  albuterol (PROVENTIL HFA;VENTOLIN HFA) 108 (90 Base) MCG/ACT inhaler Inhale 1 puff into the lungs every 4 (four) hours as needed for wheezing or shortness of breath. 12/11/16  Yes [provider]    cyclobenzaprine (FLEXERIL) 10 MG tablet Take 0.5 tablets (5 mg total) by mouth 3 (three) times daily as needed for muscle spasms. Patient not taking: Reported on 12/29/2016 09/13/16   Orlie Dakin, MD  naproxen (NAPROSYN) 375 MG tablet Take 1 tablet (375 mg total) by mouth 2 (two) times daily. 12/29/16   Dorie Rank, MD    Family History Family History  Problem Relation Age of Onset  . Diabetes Mother   . Hypertension Mother   . Diabetes Maternal Grandmother   . Hypertension Maternal Grandmother   . Diabetes Maternal Grandfather   . Hypertension Father     Social History Social History   Tobacco Use  . Smoking status: Current Every Day Smoker    Types: Cigarettes  . Smokeless tobacco: Never Used  Substance Use Topics  . Alcohol use: No    Alcohol/week: 0.0 oz  . Drug use: No     Allergies   Penicillins and Pepperoni [pickled meat]   Review of Systems Review of Systems  Constitutional: Negative for fever.  Respiratory: Positive for cough and shortness of breath. Negative for wheezing.   Cardiovascular: Negative for leg swelling.  All other systems reviewed and are negative.    Physical Exam Updated Vital Signs BP 127/66   Pulse 77   Temp 98 F (36.7 C) (Oral)  Resp 16   Ht 1.524 m (5')   Wt 82.6 kg (182 lb)   LMP 12/15/2016   SpO2 97%   BMI 35.54 kg/m   Physical Exam   ED Treatments / Results  Labs (all labs ordered are listed, but only abnormal results are displayed) Labs Reviewed  I-STAT CHEM 8, ED  I-STAT TROPONIN, ED    EKG  EKG Interpretation  Date/Time:  Monday December 29 2016 09:58:15 EST Ventricular Rate:  79 PR Interval:    QRS Duration: 87 QT Interval:  377 QTC Calculation: 433 R Axis:   32 Text Interpretation:  Sinus rhythm Early repolarization No old tracing to compare Confirmed by Dorie Rank [phone]) on 12/29/2016 3:06:44 PM       Radiology Dg Chest 2 View  Result Date: 12/29/2016 CLINICAL DATA:  Exertional  shortness of breath for the past 2 weeks associated with nonproductive cough and pleuritic chest pain radiating to the left axilla and arm. Also headaches. No fever. Current smoker. History of bronchitis. EXAM: CHEST  2 VIEW COMPARISON:  PA and lateral chest x-ray of May 29, 2016 FINDINGS: The lungs are adequately inflated. The interstitial markings are mildly prominent though stable. There is no alveolar infiltrate or pleural effusion. There is no pneumothorax. The heart and pulmonary vascularity are normal. The trachea is midline. The bony thorax is unremarkable. IMPRESSION: Mild bronchitic changes.  No alveolar pneumonia. Electronically Signed   By: David  Martinique M.D.   On: 12/29/2016 10:12    Procedures Procedures (including critical care time)  Medications Ordered in ED Medications - No data to display   Initial Impression / Assessment and Plan / ED Course  I have reviewed the triage vital signs and the nursing notes.  Pertinent labs & imaging results that were available during my care of the patient were reviewed by me and considered in my medical decision making (see chart for details).  Clinical Course as of Dec 30 1710  Mon Dec 29, 2016  1508 Low risk for PE.  Perc negative  [JK]    Clinical Course User Index [JK] Dorie Rank, MD    Patient presents to emergency room with complaints of shortness of breath and intermittent chest pain.  Patient is breathing easily here in the emergency room.  She is not tachypneic or hypoxic.  Her lung exam is clear.  She is low risk for pulmonary embolism and is PERC negative.  Sx atypical for acs.  Doubt cardiac etiology.  At this time there does not appear to be any evidence of an acute emergency medical condition and the patient appears stable for discharge with appropriate outpatient follow up.   Final Clinical Impressions(s) / ED Diagnoses   Final diagnoses:  Shortness of breath    ED Discharge Orders        Ordered    naproxen  (NAPROSYN) 375 MG tablet  2 times daily     12/29/16 1712       Dorie Rank, MD 12/29/16 [phone]

## 2016-12-29 NOTE — ED Notes (Signed)
Patient informed of the wait time and was asked if they needed anything.

## 2016-12-29 NOTE — Discharge Instructions (Signed)
Follow-up with your pulmonary doctor and primary care doctor as planned.  Continue your current medications.

## 2016-12-29 NOTE — ED Triage Notes (Signed)
Patient c/o SOb with exertion that has been going on for over 2 weeks, saw PCP her referred patient to specialist but unable to be seen until after Christmas. patient reports dry cough.

## 2017-01-24 ENCOUNTER — Other Ambulatory Visit: Payer: Self-pay

## 2017-01-24 ENCOUNTER — Encounter (HOSPITAL_COMMUNITY): Payer: Self-pay | Admitting: *Deleted

## 2017-01-24 ENCOUNTER — Emergency Department (HOSPITAL_COMMUNITY)
Admission: EM | Admit: 2017-01-24 | Discharge: 2017-01-24 | Disposition: A | Discharge status: Home / Self Care | Payer: 59 | Attending: Emergency Medicine | Admitting: Emergency Medicine

## 2017-01-24 DIAGNOSIS — R05 Cough: Secondary | ICD-10-CM

## 2017-01-24 DIAGNOSIS — F17210 Nicotine dependence, cigarettes, uncomplicated: Secondary | ICD-10-CM | POA: Diagnosis not present

## 2017-01-24 DIAGNOSIS — J111 Influenza due to unidentified influenza virus with other respiratory manifestations: Secondary | ICD-10-CM

## 2017-01-24 DIAGNOSIS — R42 Dizziness and giddiness: Secondary | ICD-10-CM

## 2017-01-24 DIAGNOSIS — J069 Acute upper respiratory infection, unspecified: Secondary | ICD-10-CM | POA: Diagnosis not present

## 2017-01-24 DIAGNOSIS — R197 Diarrhea, unspecified: Secondary | ICD-10-CM

## 2017-01-24 DIAGNOSIS — R110 Nausea: Secondary | ICD-10-CM | POA: Diagnosis not present

## 2017-01-24 DIAGNOSIS — R059 Cough, unspecified: Secondary | ICD-10-CM

## 2017-01-24 DIAGNOSIS — Z720 Tobacco use: Secondary | ICD-10-CM

## 2017-01-24 DIAGNOSIS — R69 Illness, unspecified: Secondary | ICD-10-CM

## 2017-01-24 LAB — COMPREHENSIVE METABOLIC PANEL
ALT: 55 U/L — ABNORMAL HIGH (ref 14–54)
AST: 44 U/L — ABNORMAL HIGH (ref 15–41)
Albumin: 3.7 g/dL (ref 3.5–5.0)
Alkaline Phosphatase: 77 U/L (ref 38–126)
Anion gap: 5 (ref 5–15)
BUN: 11 mg/dL (ref 6–20)
CO2: 23 mmol/L (ref 22–32)
Calcium: 8.8 mg/dL — ABNORMAL LOW (ref 8.9–10.3)
Chloride: 107 mmol/L (ref 101–111)
Creatinine, Ser: 0.69 mg/dL (ref 0.44–1.00)
GFR calc Af Amer: >60 mL/min (ref >60)
GFR calc non Af Amer: >60 mL/min (ref >60)
Glucose, Bld: 104 mg/dL — ABNORMAL HIGH (ref 65–99)
Potassium: 4.4 mmol/L (ref 3.5–5.1)
Sodium: 135 mmol/L (ref 135–145)
Total Bilirubin: 0.7 mg/dL (ref 0.3–1.2)
Total Protein: 7.5 g/dL (ref 6.5–8.1)

## 2017-01-24 LAB — CBC
HCT: 41.1 % (ref 36.0–46.0)
Hemoglobin: 13.6 g/dL (ref 12.0–15.0)
MCH: 29.6 pg (ref 26.0–34.0)
MCHC: 33.1 g/dL (ref 30.0–36.0)
MCV: 89.3 fL (ref 78.0–100.0)
Platelets: 323 10*3/uL (ref 150–400)
RBC: 4.6 MIL/uL (ref 3.87–5.11)
RDW: 13.8 % (ref 11.5–15.5)
WBC: 9.2 10*3/uL (ref 4.0–10.5)

## 2017-01-24 LAB — I-STAT BETA HCG BLOOD, ED (MC, WL, AP ONLY): I-stat hCG, quantitative: <5 m[IU]/mL (ref <5)

## 2017-01-24 LAB — LIPASE, BLOOD: Lipase: 21 U/L (ref 11–51)

## 2017-01-24 MED ORDER — ONDANSETRON 4 MG PO TBDP: 4.0000 mg | ORAL_TABLET | Freq: Three times a day (TID) | ORAL | 0 refills | Status: DC | PRN

## 2017-01-24 MED ORDER — FLUTICASONE PROPIONATE 50 MCG/ACT NA SUSP: 2.0000 | Freq: Every day | NASAL | 0 refills | Status: DC

## 2017-01-24 NOTE — ED Triage Notes (Signed)
Pt with URI symptoms for 2 days, started with diarrhea and dizziness yesterday. "almost fell out this morning"

## 2017-01-24 NOTE — Discharge Instructions (Signed)
Your work up today has been reassuring, your symptoms are likely from a viral upper respiratory illness. Use zofran as directed as needed for nausea. Continue to stay well-hydrated. Gargle warm salt water and spit it out. Use chloraseptic spray as needed for sore throat. Continue to alternate between Tylenol and Ibuprofen for pain or fever. Use Mucinex for cough suppression/expectoration of mucus. Use netipot and flonase to help with nasal congestion. May consider over-the-counter Benadryl or other antihistamine to decrease secretions and for help with your symptoms. STOP SMOKING!  May consider using over the counter tums, maalox, pepto bismol, or other over the counter remedies to help with diarrhea symptoms. Stay well hydrated with small sips of fluids throughout the day. Follow a BRAT (banana-rice-applesauce-toast) diet as described below for the next 24-48 hours. The 'BRAT' diet is suggested, then progress to diet as tolerated as symptoms abate. Call your regular doctor if bloody stools, persistent diarrhea, vomiting, fever or abdominal pain. Follow up with your primary care doctor in 5-7 days for recheck of ongoing symptoms. Return to emergency department for emergent changing or worsening of symptoms.

## 2017-01-24 NOTE — ED Provider Notes (Signed)
[facility] DEPT Provider Note   CSN: [id number] Arrival date & time: 01/24/17  3710     History   Chief Complaint Chief Complaint  Patient presents with  . URI  . Dizziness  . Diarrhea    HPI Jessica MCCLEES is a 32 y.o. female with a PMHx of bronchitis, and PSHx of appendectomy, who presents to the ED with complaints of URI symptoms x 3 days.  Patient reports having a dry cough, sinus congestion, and sore throat for the last 3 days, and has had intermittent episodes of lightheadedness when she stands up that improved with laying down.  She has not tried anything for her symptoms.  No other known aggravating factors aside from standing up.  No known sick contacts.  She reports associated body aches, low-grade fever with Tmax 100.3 yesterday which has since resolved, nausea, and 5 episodes of NB watery diarrhea today which started yesterday.  She admits to being a cigarette smoker.  She denies ear pain/drainage, drooling, trismus, hemoptysis, CP, SOB, LE swelling, recent travel/surgery/immobilization, estrogen use, personal/family hx of DVT/PE, abd pain, vomiting, constipation, melena, hematochezia, hematuria, dysuria, arthralgias, vision changes, vertigo, LOC, numbness, tingling, focal weakness, or any other complaints at this time.    The history is provided by the patient and medical records. No language interpreter was used.  URI   This is a new problem. The current episode started more than 2 days ago. The problem has not changed since onset.The maximum temperature recorded prior to her arrival was 100 to 100.9 F. The fever has been present for less than 1 day. Associated symptoms include diarrhea, nausea, congestion, sore throat and cough. Pertinent negatives include no chest pain, no abdominal pain, no vomiting, no dysuria and no ear pain. She has tried nothing for the symptoms. The treatment provided no relief.  Diarrhea   Associated symptoms include  myalgias (body aches), URI and cough. Pertinent negatives include no abdominal pain, no vomiting and no arthralgias.    Past Medical History:  Diagnosis Date  . Bronchitis   . Miscarriage     Patient Active Problem List   Diagnosis Date Noted  . Acute appendicitis 12/30/2013    Past Surgical History:  Procedure Laterality Date  . APPENDECTOMY    . CESAREAN SECTION    . LAPAROSCOPIC APPENDECTOMY N/A 12/30/2013   Procedure: APPENDECTOMY LAPAROSCOPIC;  Surgeon: Jackolyn Confer, MD;  Location: [facility];  Service: General;  Laterality: N/A;    OB History    Gravida Para Term Preterm AB Living   4 1 1   2 1    SAB TAB Ectopic Multiple Live Births   1       1       Home Medications    Prior to Admission medications   Medication Sig Start Date End Date Taking? Authorizing Provider  albuterol (PROVENTIL HFA;VENTOLIN HFA) 108 (90 Base) MCG/ACT inhaler Inhale 1 puff into the lungs every 4 (four) hours as needed for wheezing or shortness of breath. 12/11/16   [provider]  cyclobenzaprine (FLEXERIL) 10 MG tablet Take 0.5 tablets (5 mg total) by mouth 3 (three) times daily as needed for muscle spasms. Patient not taking: Reported on 12/29/2016 09/13/16   Orlie Dakin, MD  naproxen (NAPROSYN) 375 MG tablet Take 1 tablet (375 mg total) by mouth 2 (two) times daily. 12/29/16   Dorie Rank, MD    Family History Family History  Problem Relation Age of Onset  . Diabetes Mother   .  Hypertension Mother   . Diabetes Maternal Grandmother   . Hypertension Maternal Grandmother   . Diabetes Maternal Grandfather   . Hypertension Father     Social History Social History   Tobacco Use  . Smoking status: Current Every Day Smoker    Types: Cigarettes  . Smokeless tobacco: Never Used  Substance Use Topics  . Alcohol use: No    Alcohol/week: 0.0 oz  . Drug use: No     Allergies   Penicillins and Pepperoni [pickled meat]   Review of Systems Review of Systems    Constitutional: Positive for fever (low grade, TMax 100.3).  HENT: Positive for congestion and sore throat. Negative for drooling, ear discharge, ear pain and trouble swallowing.   Eyes: Negative for visual disturbance.  Respiratory: Positive for cough. Negative for shortness of breath.   Cardiovascular: Negative for chest pain and leg swelling.  Gastrointestinal: Positive for diarrhea and nausea. Negative for abdominal pain, blood in stool, constipation and vomiting.  Genitourinary: Negative for dysuria and hematuria.  Musculoskeletal: Positive for myalgias (body aches). Negative for arthralgias.  Skin: Negative for color change.  Allergic/Immunologic: Negative for immunocompromised state.  Neurological: Positive for light-headedness. Negative for dizziness, syncope, weakness and numbness.  Psychiatric/Behavioral: Negative for confusion.   All other systems reviewed and are negative for acute change except as noted in the HPI.    Physical Exam Updated Vital Signs BP 116/86 (BP Location: Left Arm)   Pulse 82   Temp 98.6 F (37 C) (Oral)   Resp 18   Ht 5' (1.524 m)   Wt 82.6 kg (182 lb)   LMP 04/27/2016   SpO2 98%   BMI 35.54 kg/m   Physical Exam  Constitutional: She is oriented to person, place, and time. Vital signs are normal. She appears well-developed and well-nourished.  Non-toxic appearance. No distress.  Afebrile, nontoxic, NAD  HENT:  Head: Normocephalic and atraumatic.  Right Ear: Hearing, external ear and ear canal normal. Tympanic membrane is not erythematous and not bulging. A middle ear effusion is present.  Left Ear: Hearing, external ear and ear canal normal. Tympanic membrane is not erythematous and not bulging. A middle ear effusion is present.  Nose: Mucosal edema present.  Mouth/Throat: Uvula is midline and oropharynx is clear and moist. Mucous membranes are dry. No trismus in the jaw. No uvula swelling. Tonsils are 0 on the right. Tonsils are 0 on the left.  No tonsillar exudate.  Ears with mild serous effusions bilaterally, but otherwise clear, and no evidence of AOM. Nose congested. Lips slightly dry. Oropharynx clear and moist, without uvular swelling or deviation, no trismus or drooling, no tonsillar swelling or erythema, no exudates.    Eyes: Conjunctivae and EOM are normal. Pupils are equal, round, and reactive to light. Right eye exhibits no discharge. Left eye exhibits no discharge.  PERRL, EOMI, no nystagmus, no visual field deficits   Neck: Normal range of motion. Neck supple. No spinous process tenderness and no muscular tenderness present. No neck rigidity. Normal range of motion present.  FROM intact without spinous process TTP, no bony stepoffs or deformities, no paraspinous muscle TTP or muscle spasms. No rigidity or meningeal signs. No bruising or swelling.   Cardiovascular: Normal rate, regular rhythm, normal heart sounds and intact distal pulses. Exam reveals no gallop and no friction rub.  No murmur heard. Pulmonary/Chest: Effort normal and breath sounds normal. No respiratory distress. She has no decreased breath sounds. She has no wheezes. She has no rhonchi.  She has no rales.  CTAB in all lung fields, no w/r/r, no hypoxia or increased WOB, speaking in full sentences, SpO2 98% on RA   Abdominal: Soft. Normal appearance and bowel sounds are normal. She exhibits no distension. There is no tenderness. There is no rigidity, no rebound, no guarding, no CVA tenderness, no tenderness at McBurney's point and negative Murphy's sign.  Soft, NTND, +BS throughout, no r/g/r, neg murphy's, neg mcburney's, no CVA TTP   Musculoskeletal: Normal range of motion.  MAE x4 Strength and sensation grossly intact in all extremities Distal pulses intact Gait steady  Neurological: She is alert and oriented to person, place, and time. She has normal strength. No cranial nerve deficit or sensory deficit. Coordination and gait normal. GCS eye subscore is 4. GCS  verbal subscore is 5. GCS motor subscore is 6.  CN 2-12 grossly intact A&O x4 GCS 15 Sensation and strength intact Gait nonataxic including with tandem walking Coordination with finger-to-nose WNL Neg pronator drift   Skin: Skin is warm, dry and intact. No rash noted.  Psychiatric: She has a normal mood and affect.  Nursing note and vitals reviewed.    ED Treatments / Results  Labs (all labs ordered are listed, but only abnormal results are displayed) Labs Reviewed  COMPREHENSIVE METABOLIC PANEL - Abnormal; Notable for the following components:      Result Value   Glucose, Bld 104 (*)    Calcium 8.8 (*)    AST 44 (*)    ALT 55 (*)    All other components within normal limits  LIPASE, BLOOD  CBC  I-STAT BETA HCG BLOOD, ED ([facility], [facility], [facility] ONLY)    EKG  EKG Interpretation None       Radiology No results found.  Procedures Procedures (including critical care time)  Medications Ordered in ED Medications - No data to display   Initial Impression / Assessment and Plan / ED Course  I have reviewed the triage vital signs and the nursing notes.  Pertinent labs & imaging results that were available during my care of the patient were reviewed by me and considered in my medical decision making (see chart for details).     32 y.o. female here with URI symptoms x3 days with associated nausea, diarrhea, and lightheadedness. On exam, lips slightly dry, throat clear, nose congested, lungs clear, ears with mild serous effusion bilaterally but no evidence of AOM, no focal neuro deficits, gait steady, no abdominal tenderness. Work up thus far reveals: betaHCG neg, Lipase WNL, CMP with marginally elevated AST/ALT (44/55 respectively) but otherwise unremarkable, CBC WNL. Pt offered option of IVFs to rehydrate given the diarrhea and orthostatic lightheadedness, but pt declined and states she'd rather go home. Lightheadedness likely from mild serous effusion of both ears, likely related to  viral URI; will send home with zofran and flonase, advised other OTC remedies for symptomatic relief, as well as BRAT diet. Pt tolerating PO well here, advised to stay hydrated. Smoking cessation strongly encouraged. Doubt need for further emergent work up at this time. Advised f/up with PCP in 1wk. I explained the diagnosis and have given explicit precautions to return to the ER including for any other new or worsening symptoms. The patient understands and accepts the medical plan as it's been dictated and I have answered their questions. Discharge instructions concerning home care and prescriptions have been given. The patient is STABLE and is discharged to home in good condition.    Final Clinical Impressions(s) / ED  Diagnoses   Final diagnoses:  Upper respiratory tract infection, unspecified type  Cough  Orthostatic lightheadedness  Diarrhea, unspecified type  Nausea  Influenza-like illness  Tobacco user    ED Discharge Orders        Ordered    fluticasone (FLONASE) 50 MCG/ACT nasal spray  Daily     01/24/17 1724    ondansetron (ZOFRAN ODT) 4 MG disintegrating tablet  Every 8 hours PRN     01/24/17 [address] [place], [place] 01/24/17 1733    Lacretia Leigh, MD 01/25/17 1557

## 2017-03-09 ENCOUNTER — Encounter (HOSPITAL_COMMUNITY): Payer: Self-pay | Admitting: Emergency Medicine

## 2017-03-09 ENCOUNTER — Emergency Department (HOSPITAL_COMMUNITY)
Admission: EM | Admit: 2017-03-09 | Discharge: 2017-03-09 | Disposition: A | Discharge status: Home / Self Care | Payer: 59 | Attending: Emergency Medicine | Admitting: Emergency Medicine

## 2017-03-09 DIAGNOSIS — M25561 Pain in right knee: Secondary | ICD-10-CM | POA: Insufficient documentation

## 2017-03-09 DIAGNOSIS — F17210 Nicotine dependence, cigarettes, uncomplicated: Secondary | ICD-10-CM | POA: Diagnosis not present

## 2017-03-09 MED ORDER — IBUPROFEN 600 MG PO TABS: 600.0000 mg | ORAL_TABLET | Freq: Four times a day (QID) | ORAL | 0 refills | Status: DC | PRN

## 2017-03-09 NOTE — ED Triage Notes (Signed)
Patient has right knee pain that has been going on since last year. Reports that got injection at orthopedic last year and helped until last week. Was given Voltaren cream but not helping. 

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
## 2017-03-10 ENCOUNTER — Ambulatory Visit (INDEPENDENT_AMBULATORY_CARE_PROVIDER_SITE_OTHER): Payer: 59 | Admitting: Orthopaedic Surgery

## 2017-03-10 DIAGNOSIS — M25561 Pain in right knee: Secondary | ICD-10-CM

## 2017-03-10 DIAGNOSIS — G8929 Other chronic pain: Secondary | ICD-10-CM | POA: Diagnosis not present
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
## 2017-03-16 ENCOUNTER — Telehealth (INDEPENDENT_AMBULATORY_CARE_PROVIDER_SITE_OTHER): Payer: Self-pay | Admitting: Orthopaedic Surgery

## 2017-03-16 ENCOUNTER — Encounter (INDEPENDENT_AMBULATORY_CARE_PROVIDER_SITE_OTHER): Payer: Self-pay

## 2017-03-16 NOTE — Telephone Encounter (Signed)
That will be fine. 

## 2017-03-16 NOTE — Telephone Encounter (Signed)
Patient aware note at front desk  

## 2017-03-16 NOTE — Telephone Encounter (Signed)
Please advise 

## 2017-03-16 NOTE — Telephone Encounter (Signed)
Patient was written out of work until 3/1, when she went back to work her knee was hurting really bad the 8 hours she was there and she said she could barely get out of bed on Saturday. She is asking for another work note to be written to work 4 hour work days until 3/12, her next appt. Please call patient once ready for pick up # [phone]

## 2017-03-20 ENCOUNTER — Other Ambulatory Visit (INDEPENDENT_AMBULATORY_CARE_PROVIDER_SITE_OTHER): Payer: Self-pay | Admitting: Physician Assistant

## 2017-03-20 ENCOUNTER — Ambulatory Visit
Admission: RE | Admit: 2017-03-20 | Discharge: 2017-03-20 | Disposition: A | Discharge status: Home / Self Care | Payer: 59 | Source: Ambulatory Visit | Attending: Physician Assistant | Admitting: Physician Assistant

## 2017-03-20 DIAGNOSIS — G8929 Other chronic pain: Secondary | ICD-10-CM

## 2017-03-20 DIAGNOSIS — M25561 Pain in right knee: Principal | ICD-10-CM

## 2017-03-23 ENCOUNTER — Telehealth (INDEPENDENT_AMBULATORY_CARE_PROVIDER_SITE_OTHER): Payer: Self-pay | Admitting: Orthopaedic Surgery

## 2017-03-23 ENCOUNTER — Encounter (INDEPENDENT_AMBULATORY_CARE_PROVIDER_SITE_OTHER): Payer: Self-pay

## 2017-03-23 NOTE — Telephone Encounter (Signed)
Would you call her please, she wants to talk to you

## 2017-03-23 NOTE — Telephone Encounter (Signed)
The MRI of her knee showed no tear of either meniscus or the ACL or PCL.  Basically it shows some mild arthritis of the patella but no other significant findings that warrant any type of surgery.  She can return to full work duties without restrictions from our standpoint.  Either you can give her a call or send this to Artis Delay to call her since he saw her last.

## 2017-03-23 NOTE — Telephone Encounter (Signed)
Patient called asked if she can return to work without restrictions. The number to contact patient is [phone]

## 2017-03-23 NOTE — Telephone Encounter (Signed)
Patient aware note at front desk  

## 2017-03-23 NOTE — Telephone Encounter (Signed)
Please advise 

## 2017-03-23 NOTE — Telephone Encounter (Signed)
Jessica Mcpherson called patient with results.

## 2017-03-23 NOTE — Telephone Encounter (Signed)
Patient called stating that she is not able to come to her appointment tomorrow to review her MRI because of another [facility] appointment.  She is wanting to know if she can get the results over the phone and also wants to know if she can go back to work without any restrictions.  CB#[phone].  Thank you.

## 2017-03-24 ENCOUNTER — Ambulatory Visit (INDEPENDENT_AMBULATORY_CARE_PROVIDER_SITE_OTHER): Payer: 59 | Admitting: Orthopaedic Surgery

## 2017-03-31 ENCOUNTER — Ambulatory Visit (INDEPENDENT_AMBULATORY_CARE_PROVIDER_SITE_OTHER): Payer: 59 | Admitting: Physician Assistant

## 2017-04-29 ENCOUNTER — Other Ambulatory Visit (INDEPENDENT_AMBULATORY_CARE_PROVIDER_SITE_OTHER): Payer: Self-pay

## 2017-04-29 ENCOUNTER — Telehealth (INDEPENDENT_AMBULATORY_CARE_PROVIDER_SITE_OTHER): Payer: Self-pay | Admitting: Orthopaedic Surgery

## 2017-04-29 MED ORDER — NABUMETONE 500 MG PO TABS: 750.0000 mg | ORAL_TABLET | Freq: Two times a day (BID) | ORAL | 1 refills | Status: DC | PRN

## 2017-04-29 NOTE — Telephone Encounter (Signed)
Patient called asked if Dr Ninfa Linden will prescribe her something for pain. Patient advised her right knee is hurting pretty bad and it is hard for her to work. Patient said she walk all day and it is hard to work with the pain. The number to contact patient is [phone]

## 2017-04-29 NOTE — Telephone Encounter (Signed)
Please advise 

## 2017-04-29 NOTE — Telephone Encounter (Signed)
The only meds we can prescribe are NSAIDs.  Try relafen 500 mg twice daily as needed, #60

## 2017-04-29 NOTE — Telephone Encounter (Signed)
Called into her pharmacy  

## 2017-04-29 NOTE — Telephone Encounter (Signed)
Patient called and stated she would like the RX called in to [facility] on [facility]

## 2017-04-29 NOTE — Telephone Encounter (Signed)
Left message on machine of the below message and asked her to call back with pharmacy information if this is something she would like me to call in for her

## 2017-05-11 ENCOUNTER — Emergency Department (HOSPITAL_COMMUNITY)
Admission: EM | Admit: 2017-05-11 | Discharge: 2017-05-11 | Disposition: A | Discharge status: Home / Self Care | Payer: 59 | Attending: Emergency Medicine | Admitting: Emergency Medicine

## 2017-05-11 ENCOUNTER — Other Ambulatory Visit: Payer: Self-pay

## 2017-05-11 ENCOUNTER — Encounter (HOSPITAL_COMMUNITY): Payer: Self-pay

## 2017-05-11 DIAGNOSIS — G501 Atypical facial pain: Secondary | ICD-10-CM | POA: Insufficient documentation

## 2017-05-11 DIAGNOSIS — R51 Headache: Secondary | ICD-10-CM

## 2017-05-11 DIAGNOSIS — H9201 Otalgia, right ear: Secondary | ICD-10-CM | POA: Insufficient documentation

## 2017-05-11 DIAGNOSIS — R519 Headache, unspecified: Secondary | ICD-10-CM

## 2017-05-11 DIAGNOSIS — K047 Periapical abscess without sinus: Secondary | ICD-10-CM | POA: Diagnosis not present

## 2017-05-11 DIAGNOSIS — Z79899 Other long term (current) drug therapy: Secondary | ICD-10-CM | POA: Diagnosis not present

## 2017-05-11 DIAGNOSIS — F17210 Nicotine dependence, cigarettes, uncomplicated: Secondary | ICD-10-CM | POA: Insufficient documentation

## 2017-05-11 MED ORDER — CLINDAMYCIN HCL 300 MG PO CAPS: 300.0000 mg | ORAL_CAPSULE | Freq: Four times a day (QID) | ORAL | 0 refills | Status: DC

## 2017-05-11 MED ORDER — IBUPROFEN 800 MG PO TABS: 800.0000 mg | ORAL_TABLET | Freq: Three times a day (TID) | ORAL | 0 refills | Status: DC

## 2017-05-11 NOTE — ED Notes (Signed)
Discharge instructions reviewed with patient. Patient verbalizes understanding. VSS.   

## 2017-05-11 NOTE — ED Provider Notes (Signed)
[facility] DEPT Provider Note   CSN: [id number] Arrival date & time: 05/11/17  0707     History   Chief Complaint Chief Complaint  Patient presents with  . Otalgia    HPI Jessica Mcpherson is a 32 y.o. female.  HPI Patient reports for several months she has been having problems with pain on the right side of her face and ear.  She reports it predominantly concentrates that her ear.  She has not seen any swelling.  There is been no drainage or discharge.  No fever.  She had seen her family doctor and the noted that she had fluid in both ears and treated her with prednisone.  She reports symptoms have not improved.  She reports today she felt somewhat dizzy and had continued pain.  Patient does have dental decay but did not denies that she was having significant dental pain that she is noted.  No visual changes.  No sinus drainage or discharge.  No incoordination. Past Medical History:  Diagnosis Date  . Bronchitis   . Miscarriage     Patient Active Problem List   Diagnosis Date Noted  . Acute appendicitis 12/30/2013    Past Surgical History:  Procedure Laterality Date  . APPENDECTOMY    . CESAREAN SECTION    . LAPAROSCOPIC APPENDECTOMY N/A 12/30/2013   Procedure: APPENDECTOMY LAPAROSCOPIC;  Surgeon: Jackolyn Confer, MD;  Location: [facility];  Service: General;  Laterality: N/A;     OB History    Gravida  4   Para  1   Term  1   Preterm      AB  2   Living  1     SAB  1   TAB      Ectopic      Multiple      Live Births  1            Home Medications    Prior to Admission medications   Medication Sig Start Date End Date Taking? Authorizing Provider  albuterol (PROVENTIL HFA;VENTOLIN HFA) 108 (90 Base) MCG/ACT inhaler Inhale 1 puff into the lungs every 4 (four) hours as needed for wheezing or shortness of breath. 12/11/16  Yes [provider]  fluticasone (FLONASE) 50 MCG/ACT nasal spray Place 2 sprays into both  nostrils daily. 01/24/17  Yes Street, [place], PA-C  ibuprofen (ADVIL,MOTRIN) 600 MG tablet Take 1 tablet (600 mg total) by mouth every 6 (six) hours as needed. 03/09/17  Yes Julianne Rice, MD  nabumetone (RELAFEN) 500 MG tablet Take 1.5 tablets (750 mg total) by mouth 2 (two) times daily as needed. Patient taking differently: Take 750 mg by mouth 2 (two) times daily as needed for mild pain.  04/29/17  Yes Mcarthur Rossetti, MD  clindamycin (CLEOCIN) 300 MG capsule Take 1 capsule (300 mg total) by mouth 4 (four) times daily. X 7 days 05/11/17   Charlesetta Shanks, MD  cyclobenzaprine (FLEXERIL) 10 MG tablet Take 0.5 tablets (5 mg total) by mouth 3 (three) times daily as needed for muscle spasms. Patient not taking: Reported on 12/29/2016 09/13/16   Orlie Dakin, MD  ibuprofen (ADVIL,MOTRIN) 800 MG tablet Take 1 tablet (800 mg total) by mouth 3 (three) times daily. 05/11/17   Charlesetta Shanks, MD  naproxen (NAPROSYN) 375 MG tablet Take 1 tablet (375 mg total) by mouth 2 (two) times daily. Patient not taking: Reported on 05/11/2017 12/29/16   Dorie Rank, MD  ondansetron (ZOFRAN ODT) 4 MG disintegrating  tablet Take 1 tablet (4 mg total) by mouth every 8 (eight) hours as needed for nausea or vomiting. Patient not taking: Reported on 05/11/2017 01/24/17   Street, [place], PA-C    Family History Family History  Problem Relation Age of Onset  . Diabetes Mother   . Hypertension Mother   . Diabetes Maternal Grandmother   . Hypertension Maternal Grandmother   . Diabetes Maternal Grandfather   . Hypertension Father     Social History Social History   Tobacco Use  . Smoking status: Current Every Day Smoker    Packs/day: 0.10    Types: Cigarettes  . Smokeless tobacco: Never Used  Substance Use Topics  . Alcohol use: No    Alcohol/week: 0.0 oz  . Drug use: No     Allergies   Penicillins and Pepperoni [pickled meat]   Review of Systems Review of Systems 10 Systems reviewed and are  negative for acute change except as noted in the HPI.   Physical Exam Updated Vital Signs BP (!) 133/97   Pulse 69   Temp 98.3 F (36.8 C) (Oral)   Resp 15   Ht 5' (1.524 m)   Wt 83.9 kg (185 lb)   LMP 05/11/2017   SpO2 100%   BMI 36.13 kg/m   Physical Exam  Constitutional: She is oriented to person, place, and time.  Patient is alert and nontoxic.  Clinically well in appearance.  HENT:  No facial swelling.  No soft tissue changes.  No pain to palpation over the temple, behind the ear or the soft tissues of the neck.  Bilateral TMs are normal.  Nasal passages normal.  Mucous membranes and posterior oropharynx normal.  Patient has dental decay with partial dental fracture inferior posterior molars on the right.  This is tender to percussion tenderness.  No trismus.  Eyes: Pupils are equal, round, and reactive to light. EOM are normal.  Neck: Neck supple.  Cardiovascular: Normal rate, regular rhythm and normal heart sounds.  Pulmonary/Chest: Effort normal and breath sounds normal.  Musculoskeletal: Normal range of motion.  Neurological: She is alert and oriented to person, place, and time. No cranial nerve deficit. She exhibits normal muscle tone. Coordination normal.  Skin: Skin is warm and dry.  Psychiatric: She has a normal mood and affect.     ED Treatments / Results  Labs (all labs ordered are listed, but only abnormal results are displayed) Labs Reviewed - No data to display  EKG None  Radiology No results found.  Procedures Procedures (including critical care time)  Medications Ordered in ED Medications - No data to display   Initial Impression / Assessment and Plan / ED Course  I have reviewed the triage vital signs and the nursing notes.  Pertinent labs & imaging results that were available during my care of the patient were reviewed by me and considered in my medical decision making (see chart for details).       Final Clinical Impressions(s) / ED  Diagnoses   Final diagnoses:  Right ear pain  Facial pain  Apical abscess  Patient has pain for several months that concentrate at the right ear.  Examination of the external and anterior ear are normal.  No pain at the temples or other reproducible pain in the soft tissues of the neck.  Positive finding is for dental decay both upper and lower molars but percussion tenderness in the lower posterior molars.  I highly suspect this is the source of the patient's  pain.  She is otherwise healthy in appearance with negative review of systems.  Plan will be to treat with clindamycin due to penicillin allergy.  Patient reports she can schedule dental follow-up.  ED Discharge Orders        Ordered    clindamycin (CLEOCIN) 300 MG capsule  4 times daily     05/11/17 1037    ibuprofen (ADVIL,MOTRIN) 800 MG tablet  3 times daily     05/11/17 1037       Charlesetta Shanks, MD 05/11/17 1042

## 2017-05-11 NOTE — Discharge Instructions (Addendum)
1.  Make an appointment to follow-up with your dentist as soon as possible.

## 2017-05-11 NOTE — ED Notes (Signed)
Bed: WTR7 Expected date:  Expected time:  Means of arrival:  Comments: 

## 2017-05-11 NOTE — ED Triage Notes (Signed)
Patient reports that she has had right ear pain x 2 months and went to see her PCP. Patient states she was prescribed Prednisone, but right ear pain is worse and is causing her to have a headache and dizziness.

## 2017-07-07 ENCOUNTER — Emergency Department (HOSPITAL_COMMUNITY)
Admission: EM | Admit: 2017-07-07 | Discharge: 2017-07-07 | Disposition: A | Discharge status: Home / Self Care | Payer: 59 | Attending: Emergency Medicine | Admitting: Emergency Medicine

## 2017-07-07 ENCOUNTER — Other Ambulatory Visit: Payer: Self-pay

## 2017-07-07 ENCOUNTER — Encounter (HOSPITAL_COMMUNITY): Payer: Self-pay | Admitting: Emergency Medicine

## 2017-07-07 DIAGNOSIS — F17210 Nicotine dependence, cigarettes, uncomplicated: Secondary | ICD-10-CM | POA: Insufficient documentation

## 2017-07-07 DIAGNOSIS — Z79899 Other long term (current) drug therapy: Secondary | ICD-10-CM | POA: Insufficient documentation

## 2017-07-07 DIAGNOSIS — I10 Essential (primary) hypertension: Secondary | ICD-10-CM | POA: Insufficient documentation

## 2017-07-07 DIAGNOSIS — M25511 Pain in right shoulder: Secondary | ICD-10-CM | POA: Diagnosis not present

## 2017-07-07 DIAGNOSIS — M542 Cervicalgia: Secondary | ICD-10-CM

## 2017-07-07 HISTORY — DX: Essential (primary) hypertension: I10

## 2017-07-07 MED ORDER — METHOCARBAMOL 500 MG PO TABS: 500.0000 mg | ORAL_TABLET | Freq: Every day | ORAL | 0 refills | Status: DC

## 2017-07-07 MED ORDER — DICLOFENAC SODIUM 75 MG PO TBEC: 75.0000 mg | DELAYED_RELEASE_TABLET | Freq: Two times a day (BID) | ORAL | 0 refills | Status: DC
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
## 2017-07-07 NOTE — Discharge Instructions (Signed)
See your Physician for recheck in 1 week if pain persis
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
## 2017-07-10 ENCOUNTER — Encounter (HOSPITAL_COMMUNITY): Payer: Self-pay

## 2017-07-10 ENCOUNTER — Other Ambulatory Visit: Payer: Self-pay

## 2017-07-10 ENCOUNTER — Emergency Department (HOSPITAL_COMMUNITY)
Admission: EM | Admit: 2017-07-10 | Discharge: 2017-07-10 | Disposition: A | Discharge status: Home / Self Care | Payer: 59 | Attending: Emergency Medicine | Admitting: Emergency Medicine

## 2017-07-10 DIAGNOSIS — Z09 Encounter for follow-up examination after completed treatment for conditions other than malignant neoplasm: Secondary | ICD-10-CM

## 2017-07-10 DIAGNOSIS — F17210 Nicotine dependence, cigarettes, uncomplicated: Secondary | ICD-10-CM | POA: Insufficient documentation

## 2017-07-10 DIAGNOSIS — I10 Essential (primary) hypertension: Secondary | ICD-10-CM | POA: Insufficient documentation

## 2017-07-10 DIAGNOSIS — Z0279 Encounter for issue of other medical certificate: Secondary | ICD-10-CM | POA: Diagnosis not present
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
## 2017-08-27 DIAGNOSIS — F411 Generalized anxiety disorder: Secondary | ICD-10-CM | POA: Insufficient documentation

## 2017-09-23 ENCOUNTER — Emergency Department (HOSPITAL_COMMUNITY)
Admission: EM | Admit: 2017-09-23 | Discharge: 2017-09-23 | Disposition: A | Discharge status: Home / Self Care | Payer: 59 | Attending: Emergency Medicine | Admitting: Emergency Medicine

## 2017-09-23 ENCOUNTER — Encounter (HOSPITAL_COMMUNITY): Payer: Self-pay | Admitting: Emergency Medicine

## 2017-09-23 DIAGNOSIS — Y998 Other external cause status: Secondary | ICD-10-CM | POA: Diagnosis not present

## 2017-09-23 DIAGNOSIS — Y929 Unspecified place or not applicable: Secondary | ICD-10-CM | POA: Insufficient documentation

## 2017-09-23 DIAGNOSIS — F17210 Nicotine dependence, cigarettes, uncomplicated: Secondary | ICD-10-CM | POA: Diagnosis not present

## 2017-09-23 DIAGNOSIS — S00561A Insect bite (nonvenomous) of lip, initial encounter: Secondary | ICD-10-CM | POA: Insufficient documentation

## 2017-09-23 DIAGNOSIS — L509 Urticaria, unspecified: Secondary | ICD-10-CM | POA: Diagnosis not present

## 2017-09-23 DIAGNOSIS — I10 Essential (primary) hypertension: Secondary | ICD-10-CM | POA: Insufficient documentation

## 2017-09-23 DIAGNOSIS — Y9389 Activity, other specified: Secondary | ICD-10-CM | POA: Diagnosis not present

## 2017-09-23 DIAGNOSIS — W57XXXA Bitten or stung by nonvenomous insect and other nonvenomous arthropods, initial encounter: Secondary | ICD-10-CM | POA: Insufficient documentation

## 2017-09-23 DIAGNOSIS — T7840XA Allergy, unspecified, initial encounter: Secondary | ICD-10-CM | POA: Diagnosis not present

## 2017-09-23 NOTE — ED Triage Notes (Signed)
Patient here from home with complaints of insect bite on lip. Woke up this morning with upper lip swelling, since gone down after taking benadryl. Last benadryl tablet 4 hours ago. "I just want to make sure Im ok".

## 2017-09-23 NOTE — ED Provider Notes (Signed)
[facility] DEPT Provider Note   CSN: [id number] Arrival date & time: 09/23/17  1939     History   Chief Complaint Chief Complaint  Patient presents with  . Insect Bite  . Allergic Reaction    HPI Jessica Mcpherson is a 32 y.o. female.  32 y/o female with no PMH presents to the ED with a chief complaint of allergic reaction since yesterday. 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  States her top lip began to swell and she began to have hives all over her body including her arms and trunk region.  She immediately went home and took some Benadryl which decrease the swelling in her lips and made the hives go away.  She presents to the ED stating "I just want to make sure I am okay ".  Does not know what bit her yesterday but she states the Benadryl has helped her symptoms.  She denies any anaphylactic reaction, difficulty breathing, shortness of breath or other complaints.     Past Medical History:  Diagnosis Date  . Bronchitis   . Hypertension   . Miscarriage     Patient Active Problem List   Diagnosis Date Noted  . Acute appendicitis 12/30/2013    Past Surgical History:  Procedure Laterality Date  . APPENDECTOMY    . CESAREAN SECTION    . LAPAROSCOPIC APPENDECTOMY N/A 12/30/2013   Procedure: APPENDECTOMY LAPAROSCOPIC;  Surgeon: Jackolyn Confer, MD;  Location: [facility];  Service: General;  Laterality: N/A;     OB History    Gravida  4   Para  1   Term  1   Preterm      AB  2   Living  1     SAB  1   TAB      Ectopic      Multiple      Live Births  1            Home Medications    Prior to Admission medications   Medication Sig Start Date End Date Taking? Authorizing Provider  albuterol (PROVENTIL HFA;VENTOLIN HFA) 108 (90 Base) MCG/ACT inhaler Inhale 1 puff into the lungs every 4 (four) hours as needed for wheezing or shortness of  breath. 12/11/16   [provider]  clindamycin (CLEOCIN) 300 MG capsule Take 1 capsule (300 mg total) by mouth 4 (four) times daily. X 7 days 05/11/17   Charlesetta Shanks, MD  diclofenac (VOLTAREN) 75 MG EC tablet Take 1 tablet (75 mg total) by mouth 2 (two) times daily. 07/07/17   Fransico Meadow, PA-C  fluticasone (FLONASE) 50 MCG/ACT nasal spray Place 2 sprays into both nostrils daily. 01/24/17   Street, [place], PA-C  ibuprofen (ADVIL,MOTRIN) 600 MG tablet Take 1 tablet (600 mg total) by mouth every 6 (six) hours as needed. 03/09/17   Julianne Rice, MD  ibuprofen (ADVIL,MOTRIN) 800 MG tablet Take 1 tablet (800 mg total) by mouth 3 (three) times daily. 05/11/17   Charlesetta Shanks, MD  methocarbamol (ROBAXIN) 500 MG tablet Take 1 tablet (500 mg total) by mouth at bedtime. 07/07/17   Fransico Meadow, PA-C  nabumetone (RELAFEN) 500 MG tablet Take 1.5 tablets (750 mg total) by mouth 2 (two) times daily as needed. Patient taking differently: Take 750 mg by mouth 2 (two) times daily as needed for mild pain.  04/29/17   Mcarthur Rossetti, MD  Family History Family History  Problem Relation Age of Onset  . Diabetes Mother   . Hypertension Mother   . Diabetes Maternal Grandmother   . Hypertension Maternal Grandmother   . Diabetes Maternal Grandfather   . Hypertension Father     Social History Social History   Tobacco Use  . Smoking status: Current Every Day Smoker    Packs/day: 0.10    Types: Cigarettes  . Smokeless tobacco: Never Used  Substance Use Topics  . Alcohol use: No    Alcohol/week: 0.0 standard drinks  . Drug use: No     Allergies   Penicillins and Pepperoni [pickled meat]   Review of Systems Review of Systems  HENT: Positive for facial swelling.   All other systems reviewed and are negative.    Physical Exam Updated Vital Signs BP 130/84 (BP Location: Left Arm)   Pulse 82   Temp 98.7 F (37.1 C) (Oral)   Resp 14   Ht 5' (1.524 m)   Wt 78.5 kg    SpO2 100%   BMI 33.79 kg/m   Physical Exam  Constitutional: She is oriented to person, place, and time. She appears well-developed and well-nourished.  Nontoxic-appearing, playing on her phone while sitting in the room.  HENT:  Head: Normocephalic.    Mouth/Throat: Uvula is midline. No posterior oropharyngeal edema or posterior oropharyngeal erythema.  Pinpoint vesicle present on top of lip where patient got bit yesterday, there seems to be a small fluid collection on top which looks like it is resolving from patient's iPhone picture from yesterday.  No angioedema present.  Neck: Normal range of motion. Neck supple.  Cardiovascular: Normal heart sounds.  Pulmonary/Chest: Breath sounds normal. She has no wheezes.  Abdominal: Soft.  Musculoskeletal: She exhibits no tenderness.  Neurological: She is alert and oriented to person, place, and time.  Skin: Skin is warm and dry.  Nursing note and vitals reviewed.    ED Treatments / Results  Labs (all labs ordered are listed, but only abnormal results are displayed) Labs Reviewed - No data to display  EKG None  Radiology No results found.  Procedures Procedures (including critical care time)  Medications Ordered in ED Medications - No data to display   Initial Impression / Assessment and Plan / ED Course  I have reviewed the triage vital signs and the nursing notes.  Pertinent labs & imaging results that were available during my care of the patient were reviewed by me and considered in my medical decision making (see chart for details).     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  She immediately began to see her lip swelling along with hives along her body.  Patient immediately took Benadryl which seemed to resolve her symptoms.  Patient comes in to make sure that there is nothing concerning about this insect bite or that is not dangerous.  Upon examination there is a  small vesicle where patient got stung, it is fluid-filled but looks like it is resolving from the looks of patient's iPhone picture that she took yesterday.  I have advised patient she should continue to take Benadryl but only take it at night as this can make her drowsy if she experiences any fever, difficulty swallowing, shortness of breath or chest pain she may return to the ED for reevaluation.  Patient agrees and understands with plan.  Return precautions provided.  Final Clinical Impressions(s) / ED Diagnoses  Final diagnoses:  Allergic reaction, initial encounter    ED Discharge Orders    None       Janeece Fitting, Hershal Coria 09/23/17 2036    Daleen Bo, MD 09/25/17 [phone]

## 2017-09-23 NOTE — Discharge Instructions (Addendum)
Please continue to take Benadryl for your symptoms.  Remember Benadryl may cause you to be drowsy so only take it during nighttime.  Also try some Zyrtec's as this can help with the symptoms as well.  You experience any difficulty swallowing, shortness of breath, chest pain or other symptoms please return to the ED for reevaluation.

## 2017-10-27 ENCOUNTER — Encounter: Payer: Self-pay | Admitting: *Deleted

## 2017-11-13 ENCOUNTER — Emergency Department (HOSPITAL_COMMUNITY)
Admission: EM | Admit: 2017-11-13 | Discharge: 2017-11-13 | Disposition: A | Discharge status: Home / Self Care | Payer: 59 | Attending: Emergency Medicine | Admitting: Emergency Medicine

## 2017-11-13 ENCOUNTER — Encounter (HOSPITAL_COMMUNITY): Payer: Self-pay

## 2017-11-13 ENCOUNTER — Emergency Department (HOSPITAL_COMMUNITY): Payer: 59

## 2017-11-13 ENCOUNTER — Other Ambulatory Visit: Payer: Self-pay

## 2017-11-13 DIAGNOSIS — N941 Unspecified dyspareunia: Secondary | ICD-10-CM | POA: Insufficient documentation

## 2017-11-13 DIAGNOSIS — R109 Unspecified abdominal pain: Secondary | ICD-10-CM

## 2017-11-13 DIAGNOSIS — Z79899 Other long term (current) drug therapy: Secondary | ICD-10-CM | POA: Insufficient documentation

## 2017-11-13 DIAGNOSIS — R350 Frequency of micturition: Secondary | ICD-10-CM | POA: Diagnosis not present

## 2017-11-13 DIAGNOSIS — I10 Essential (primary) hypertension: Secondary | ICD-10-CM | POA: Diagnosis not present

## 2017-11-13 DIAGNOSIS — R1031 Right lower quadrant pain: Secondary | ICD-10-CM | POA: Insufficient documentation

## 2017-11-13 LAB — URINALYSIS, ROUTINE W REFLEX MICROSCOPIC
Bilirubin Urine: NEGATIVE
Glucose, UA: NEGATIVE mg/dL
Hgb urine dipstick: NEGATIVE
Ketones, ur: NEGATIVE mg/dL
Leukocytes, UA: NEGATIVE
Nitrite: NEGATIVE
Protein, ur: NEGATIVE mg/dL
Specific Gravity, Urine: 1.023 (ref 1.005–1.030)
pH: 6 (ref 5.0–8.0)

## 2017-11-13 LAB — WET PREP, GENITAL
Clue Cells Wet Prep HPF POC: NONE SEEN
Sperm: NONE SEEN
Trich, Wet Prep: NONE SEEN
Yeast Wet Prep HPF POC: NONE SEEN

## 2017-11-13 LAB — CBC WITH DIFFERENTIAL/PLATELET
Abs Immature Granulocytes: 0.04 10*3/uL (ref 0.00–0.07)
Basophils Absolute: 0 10*3/uL (ref 0.0–0.1)
Basophils Relative: 0 %
Eosinophils Absolute: 0.2 10*3/uL (ref 0.0–0.5)
Eosinophils Relative: 2 %
HCT: 39.8 % (ref 36.0–46.0)
Hemoglobin: 12.7 g/dL (ref 12.0–15.0)
Immature Granulocytes: 0 %
Lymphocytes Relative: 29 %
Lymphs Abs: 3.3 10*3/uL (ref 0.7–4.0)
MCH: 28.9 pg (ref 26.0–34.0)
MCHC: 31.9 g/dL (ref 30.0–36.0)
MCV: 90.5 fL (ref 80.0–100.0)
Monocytes Absolute: 0.7 10*3/uL (ref 0.1–1.0)
Monocytes Relative: 6 %
Neutro Abs: 7 10*3/uL (ref 1.7–7.7)
Neutrophils Relative %: 63 %
Platelets: 276 10*3/uL (ref 150–400)
RBC: 4.4 MIL/uL (ref 3.87–5.11)
RDW: 13.7 % (ref 11.5–15.5)
WBC: 11.2 10*3/uL — ABNORMAL HIGH (ref 4.0–10.5)
nRBC: 0 % (ref 0.0–0.2)

## 2017-11-13 LAB — COMPREHENSIVE METABOLIC PANEL
ALT: 29 U/L (ref 0–44)
AST: 19 U/L (ref 15–41)
Albumin: 4 g/dL (ref 3.5–5.0)
Alkaline Phosphatase: 70 U/L (ref 38–126)
Anion gap: 7 (ref 5–15)
BUN: 10 mg/dL (ref 6–20)
CO2: 24 mmol/L (ref 22–32)
Calcium: 8.9 mg/dL (ref 8.9–10.3)
Chloride: 107 mmol/L (ref 98–111)
Creatinine, Ser: 0.76 mg/dL (ref 0.44–1.00)
GFR calc Af Amer: >60 mL/min (ref >60)
GFR calc non Af Amer: >60 mL/min (ref >60)
Glucose, Bld: 78 mg/dL (ref 70–99)
Potassium: 3.9 mmol/L (ref 3.5–5.1)
Sodium: 138 mmol/L (ref 135–145)
Total Bilirubin: 0.9 mg/dL (ref 0.3–1.2)
Total Protein: 7.4 g/dL (ref 6.5–8.1)

## 2017-11-13 LAB — POC URINE PREG, ED: Preg Test, Ur: NEGATIVE

## 2017-11-13 NOTE — ED Provider Notes (Signed)
[facility] DEPT Provider Note   CSN: [id number] Arrival date & time: 11/13/17  1219     History   Chief Complaint Chief Complaint  Patient presents with  . Flank Pain  . Urinary Frequency    HPI Jessica Mcpherson is a 32 y.o. female history of hypertension, bronchitis who presents for evaluation of 3 days of right flank pain, increased urinary frequency.  Patient states that she is also having some vaginal pain with intercourse.  She reports that initially, she started having some dysuria and then states that it started having some flank pain.  Patient states that slightly radiates fine.  She denies any nausea/vomiting.  She has been able to eat and drink without any difficulty.  She has not had any fevers.  She has noticed some mild discharge but denies any vaginal bleeding.  Patient states that most of her pain now is in the suprapubic region.  Patient reports she last had intercourse last night and reports pain with intercourse.  Patient is currently sexually active with one partner.  They do use condoms.  Patient reports her last menstrual cycle was in September.  She does report a history of irregular periods.  Patient denies any fevers, chest pain, difficulty breathing, nausea/vomiting.  HPI  Past Medical History:  Diagnosis Date  . Bronchitis   . Hypertension   . Miscarriage     Patient Active Problem List   Diagnosis Date Noted  . Acute appendicitis 12/30/2013    Past Surgical History:  Procedure Laterality Date  . APPENDECTOMY    . CESAREAN SECTION    . LAPAROSCOPIC APPENDECTOMY N/A 12/30/2013   Procedure: APPENDECTOMY LAPAROSCOPIC;  Surgeon: Jackolyn Confer, MD;  Location: [facility];  Service: General;  Laterality: N/A;     OB History    Gravida  4   Para  1   Term  1   Preterm      AB  2   Living  1     SAB  1   TAB      Ectopic      Multiple      Live Births  1            Home Medications    Prior to  Admission medications   Medication Sig Start Date End Date Taking? Authorizing Provider  acetaminophen (TYLENOL) 325 MG tablet Take 650 mg by mouth every 6 (six) hours as needed for mild pain or headache.   Yes [provider]  fluconazole (DIFLUCAN) 150 MG tablet Take 150 mg by mouth once.  10/31/17  Yes [provider]  fluticasone (FLONASE) 50 MCG/ACT nasal spray Place 2 sprays into both nostrils daily. 01/24/17  Yes Street, [place], PA-C  hydrochlorothiazide (HYDRODIURIL) 25 MG tablet Take 25 mg by mouth daily.  05/28/17  Yes [provider]  clindamycin (CLEOCIN) 300 MG capsule Take 1 capsule (300 mg total) by mouth 4 (four) times daily. X 7 days Patient not taking: Reported on 11/13/2017 05/11/17   Charlesetta Shanks, MD  diclofenac (VOLTAREN) 75 MG EC tablet Take 1 tablet (75 mg total) by mouth 2 (two) times daily. Patient not taking: Reported on 11/13/2017 07/07/17   Fransico Meadow, PA-C  ibuprofen (ADVIL,MOTRIN) 600 MG tablet Take 1 tablet (600 mg total) by mouth every 6 (six) hours as needed. Patient not taking: Reported on 11/13/2017 03/09/17   Julianne Rice, MD  ibuprofen (ADVIL,MOTRIN) 800 MG tablet Take 1 tablet (800 mg total) by  mouth 3 (three) times daily. Patient not taking: Reported on 11/13/2017 05/11/17   Charlesetta Shanks, MD  methocarbamol (ROBAXIN) 500 MG tablet Take 1 tablet (500 mg total) by mouth at bedtime. Patient not taking: Reported on 11/13/2017 07/07/17   Fransico Meadow, PA-C  nabumetone (RELAFEN) 500 MG tablet Take 1.5 tablets (750 mg total) by mouth 2 (two) times daily as needed. Patient not taking: Reported on 11/13/2017 04/29/17   Mcarthur Rossetti, MD    Family History Family History  Problem Relation Age of Onset  . Diabetes Mother   . Hypertension Mother   . Diabetes Maternal Grandmother   . Hypertension Maternal Grandmother   . Diabetes Maternal Grandfather   . Hypertension Father     Social History Social History   Tobacco  Use  . Smoking status: Current Every Day Smoker    Packs/day: 0.10    Types: Cigarettes  . Smokeless tobacco: Never Used  Substance Use Topics  . Alcohol use: No    Alcohol/week: 0.0 standard drinks  . Drug use: No     Allergies   Penicillins and Pepperoni [pickled meat]   Review of Systems Review of Systems  Constitutional: Negative for fever.  Respiratory: Negative for cough and shortness of breath.   Cardiovascular: Negative for chest pain.  Gastrointestinal: Negative for abdominal pain, nausea and vomiting.  Genitourinary: Positive for dysuria, flank pain, frequency and vaginal discharge. Negative for hematuria.  Neurological: Negative for headaches.  All other systems reviewed and are negative.    Physical Exam Updated Vital Signs BP 121/80   Pulse 66   Temp 97.8 F (36.6 C) (Oral)   Resp 17   Ht 5' (1.524 m)   Wt 79.4 kg   LMP 09/18/2017   SpO2 97%   BMI 34.18 kg/m   Physical Exam  Constitutional: She is oriented to person, place, and time. She appears well-developed and well-nourished.  HENT:  Head: Normocephalic and atraumatic.  Mouth/Throat: Oropharynx is clear and moist and mucous membranes are normal.  Eyes: Pupils are equal, round, and reactive to light. Conjunctivae, EOM and lids are normal.  Neck: Full passive range of motion without pain.  Cardiovascular: Normal rate, regular rhythm, normal heart sounds and normal pulses. Exam reveals no gallop and no friction rub.  No murmur heard. Pulmonary/Chest: Effort normal and breath sounds normal.  Abdominal: Soft. Normal appearance. There is tenderness in the suprapubic area. There is CVA tenderness (Right). There is no rigidity, no guarding and no tenderness at McBurney's point.  Abdomen is soft, nondistended.  Tenderness noted to the suprapubic region.  Left-sided CVA tenderness noted.  No tenderness noted at McBurney's point.  Genitourinary: Vagina normal and uterus normal. Cervix exhibits discharge.  Cervix exhibits no motion tenderness and no friability. Right adnexum displays no mass and no tenderness. Left adnexum displays no mass and no tenderness.  Genitourinary Comments: The exam was performed with a chaperone present. Normal external female genitalia. No lesions, rash, or sores.  Cervix without any friability, discharge.  No CMT.  No adnexal mass or tenderness bilaterally.  Musculoskeletal: Normal range of motion.  Neurological: She is alert and oriented to person, place, and time.  Skin: Skin is warm and dry. Capillary refill takes less than 2 seconds.  Psychiatric: She has a normal mood and affect. Her speech is normal.  Nursing note and vitals reviewed.    ED Treatments / Results  Labs (all labs ordered are listed, but only abnormal results are displayed) Labs Reviewed  WET PREP, GENITAL - Abnormal; Notable for the following components:      Result Value   WBC, Wet Prep HPF POC FEW (*)    All other components within normal limits  CBC WITH DIFFERENTIAL/PLATELET - Abnormal; Notable for the following components:   WBC 11.2 (*)    All other components within normal limits  URINALYSIS, ROUTINE W REFLEX MICROSCOPIC  COMPREHENSIVE METABOLIC PANEL  POC URINE PREG, ED  GC/CHLAMYDIA PROBE AMP ([place]) NOT AT [facility]    EKG None  Radiology Ct Renal Stone Study  Result Date: 11/13/2017 CLINICAL DATA:  Flank pain EXAM: CT ABDOMEN AND PELVIS WITHOUT CONTRAST TECHNIQUE: Multidetector CT imaging of the abdomen and pelvis was performed following the standard protocol without oral or IV contrast. COMPARISON:  December 30, 2013 FINDINGS: Lower chest: There is slight bibasilar atelectasis. No lung base edema or consolidation. Hepatobiliary: No focal liver lesions are evident on this noncontrast enhanced study. There is a Riedel's lobe on the right, an anatomic variant. There is no appreciable gallbladder wall thickening. There is no biliary duct dilatation. Pancreas: No pancreatic mass  or inflammatory focus. Spleen: No splenic lesions are evident. Adrenals/Urinary Tract: Adrenals bilaterally appear normal. Kidneys bilaterally show no evident mass or hydronephrosis on either side. There is no evident renal or ureteral calculus on either side. Urinary bladder is midline with wall thickness within normal limits. Stomach/Bowel: There is no appreciable bowel wall or mesenteric thickening. There is no evident bowel obstruction. There is no free air or portal venous air. Vascular/Lymphatic: No abdominal aortic aneurysm. No vascular lesions are appreciable. No adenopathy is apparent in the abdomen or pelvis. Reproductive: Uterus is anteverted.  No evident pelvic mass. Other: Appendix absent. No periappendiceal region inflammation evident. There is no ascites or abscess in the abdomen or pelvis. There is a rather minimal ventral hernia containing only fat. Musculoskeletal: There are no blastic or lytic bone lesions. There is no intramuscular or abdominal wall lesion evident. IMPRESSION: 1.  No evident renal or ureteral calculus.  No hydronephrosis. 2. No evident bowel obstruction. No abscess in the abdomen or pelvis. Appendix absent. No periappendiceal region inflammation. 3.  Minimal ventral hernia containing only fat. Electronically Signed   By: Lowella Grip III M.D.   On: 11/13/2017 15:51    Procedures Procedures (including critical care time)  Medications Ordered in ED Medications - No data to display   Initial Impression / Assessment and Plan / ED Course  I have reviewed the triage vital signs and the nursing notes.  Pertinent labs & imaging results that were available during my care of the patient were reviewed by me and considered in my medical decision making (see chart for details).     32 year old female who presents for evaluation of 3 days of increased urinary frequency, dysuria, left right-sided flank pain.  Also reports pain with intercourse.  No fevers, nausea/vomiting.  Patient is afebrile, non-toxic appearing, sitting comfortably on examination table. Vital signs reviewed and stable.  On exam, patient has some mild suprapubic abdominal tenderness.  Consider UTI versus pyelonephritis versus STD.  History/physical exam is not concerning for sinusitis, diverticulitis, perforation, ovarian torsion.  Do not suspect tubo-ovarian abscess based on history/physical exam.  Plan to check basic labs, UA.  Pelvic exam as document above.  No CMT that would be concerning for PID.  Wet prep/GC/chlamydia sent.  UA is negative for any infectious etiology.  Urine pregnancy negative.  CBC shows slight leukocytosis of 11.2.  CMP is unremarkable.  CT abdomen pelvis is negative for any acute intra-abdominal abnormality.  No evidence of kidney stones.  No evidence of ovarian/pelvic mass.  Discussed results with patient.  At this time, do not suspect that patient symptoms are due to ovarian torsion or tubo-ovarian abscess given history/physical exam.  Additionally, patient is having some urinary frequency but no evidence of leukocytes or infectious etiology on today's UA.  No indication for treatment.  Will have patient follow-up with OB/GYN for further evaluation.  Discussed treatment options for gonorrhea/chlamydia with patient.  Patient does not wish to be treated at this time.  Patient had ample opportunity for questions and discussion. All patient's questions were answered with full understanding. Strict return precautions discussed. Patient expresses understanding and agreement to plan.    Final Clinical Impressions(s) / ED Diagnoses   Final diagnoses:  Right flank pain  Urinary frequency  Dyspareunia, female    ED Discharge Orders    None       Desma Mcgregor 11/14/17 2024    Varney Biles, MD 11/15/17 [phone]

## 2017-11-13 NOTE — ED Triage Notes (Signed)
Patient c/o right flank pain and urinary frequency x 3 days. Patient also c/o increased pain when she has a BM or urinates.

## 2017-11-13 NOTE — Discharge Instructions (Signed)
You can take Tylenol or Ibuprofen as directed for pain. You can alternate Tylenol and Ibuprofen every 4 hours. If you take Tylenol at 1pm, then you can take Ibuprofen at 5pm. Then you can take Tylenol again at 9pm.   As we discussed, please follow-up with the referred [facility] hospital for further evaluation of your symptoms.  You have been evaluated today for STD.   The test results with take 2-3 days to return. If there is an abnormal result, you will be notified. If you do not hear anything, that means the results were negative. You can also log on MyChart to see the results.   If you are positive, Your sexual partner needs to be treated too. Do not have sexual intercourse for the next 7 days and after your partner has been treated.   Follow-up with your primary care doctor in 2-4 days. If you do not have a primary care doctor, you can use one listed in the paperwork.   Return to the Emergency Department for any fever, abdominal pain, difficulty breathing, nausea/vomiting or any other worsening or concerning symptoms.

## 2017-11-16 LAB — GC/CHLAMYDIA PROBE AMP (~~LOC~~) NOT AT ARMC
Chlamydia: NEGATIVE
Neisseria Gonorrhea: NEGATIVE

## 2017-12-14 DIAGNOSIS — F32A Depression, unspecified: Secondary | ICD-10-CM | POA: Insufficient documentation

## 2017-12-24 ENCOUNTER — Other Ambulatory Visit (INDEPENDENT_AMBULATORY_CARE_PROVIDER_SITE_OTHER): Payer: 59

## 2017-12-24 VITALS — BP 135/84 | HR 82 | Wt 171.0 lb

## 2017-12-24 DIAGNOSIS — Z3201 Encounter for pregnancy test, result positive: Secondary | ICD-10-CM

## 2017-12-24 LAB — POCT URINE PREGNANCY: Preg Test, Ur: POSITIVE — AB

## 2017-12-24 NOTE — Progress Notes (Signed)
Pt here for UPT, pt unsure of LMP. She had a shorter than normal period around 11/13, and had a normal one around 9/18. Pt took 3 pregnancy tests in October, she states one was positive and 2 where negative. Pt has recently taken 3 pregnancy tests and states all 3 where positive.   UPT positive today in office. Instructed patient to start prenatal vitamins.  Discussed issue with LMP with Dr Kennon Rounds and when we should get patient back in. Per Dr Kennon Rounds to bring patient back in 2 weeks to do a transvaginal ultrasound to get better dating.    Crosby Oyster, RN

## 2017-12-24 NOTE — Progress Notes (Signed)
Patient seen and assessed by nursing staff.  Agree with documentation and plan.  

## 2017-12-25 ENCOUNTER — Inpatient Hospital Stay (HOSPITAL_COMMUNITY)
Admission: AD | Admit: 2017-12-25 | Discharge: 2017-12-25 | Disposition: A | Discharge status: Home / Self Care | Payer: 59 | Attending: Obstetrics & Gynecology | Admitting: Obstetrics & Gynecology

## 2017-12-25 ENCOUNTER — Encounter (HOSPITAL_COMMUNITY): Payer: Self-pay | Admitting: *Deleted

## 2017-12-25 ENCOUNTER — Inpatient Hospital Stay (HOSPITAL_COMMUNITY): Payer: 59

## 2017-12-25 DIAGNOSIS — F17210 Nicotine dependence, cigarettes, uncomplicated: Secondary | ICD-10-CM | POA: Insufficient documentation

## 2017-12-25 DIAGNOSIS — Z791 Long term (current) use of non-steroidal anti-inflammatories (NSAID): Secondary | ICD-10-CM | POA: Insufficient documentation

## 2017-12-25 DIAGNOSIS — O3680X0 Pregnancy with inconclusive fetal viability, not applicable or unspecified: Secondary | ICD-10-CM

## 2017-12-25 DIAGNOSIS — Z8249 Family history of ischemic heart disease and other diseases of the circulatory system: Secondary | ICD-10-CM | POA: Diagnosis not present

## 2017-12-25 DIAGNOSIS — O26891 Other specified pregnancy related conditions, first trimester: Secondary | ICD-10-CM | POA: Diagnosis not present

## 2017-12-25 DIAGNOSIS — O209 Hemorrhage in early pregnancy, unspecified: Secondary | ICD-10-CM | POA: Diagnosis present

## 2017-12-25 DIAGNOSIS — O23591 Infection of other part of genital tract in pregnancy, first trimester: Secondary | ICD-10-CM

## 2017-12-25 DIAGNOSIS — O161 Unspecified maternal hypertension, first trimester: Secondary | ICD-10-CM | POA: Insufficient documentation

## 2017-12-25 DIAGNOSIS — A5901 Trichomonal vulvovaginitis: Secondary | ICD-10-CM

## 2017-12-25 DIAGNOSIS — Z880 Allergy status to penicillin: Secondary | ICD-10-CM | POA: Insufficient documentation

## 2017-12-25 DIAGNOSIS — O99331 Smoking (tobacco) complicating pregnancy, first trimester: Secondary | ICD-10-CM | POA: Insufficient documentation

## 2017-12-25 DIAGNOSIS — N939 Abnormal uterine and vaginal bleeding, unspecified: Secondary | ICD-10-CM

## 2017-12-25 DIAGNOSIS — Z79899 Other long term (current) drug therapy: Secondary | ICD-10-CM | POA: Diagnosis not present

## 2017-12-25 DIAGNOSIS — R109 Unspecified abdominal pain: Secondary | ICD-10-CM

## 2017-12-25 DIAGNOSIS — O98311 Other infections with a predominantly sexual mode of transmission complicating pregnancy, first trimester: Secondary | ICD-10-CM | POA: Diagnosis not present

## 2017-12-25 DIAGNOSIS — R1030 Lower abdominal pain, unspecified: Secondary | ICD-10-CM | POA: Insufficient documentation

## 2017-12-25 DIAGNOSIS — Z3A01 Less than 8 weeks gestation of pregnancy: Secondary | ICD-10-CM | POA: Diagnosis not present

## 2017-12-25 LAB — CBC
HCT: 40.1 % (ref 36.0–46.0)
Hemoglobin: 13.2 g/dL (ref 12.0–15.0)
MCH: 29.6 pg (ref 26.0–34.0)
MCHC: 32.9 g/dL (ref 30.0–36.0)
MCV: 89.9 fL (ref 80.0–100.0)
Platelets: 318 10*3/uL (ref 150–400)
RBC: 4.46 MIL/uL (ref 3.87–5.11)
RDW: 13.6 % (ref 11.5–15.5)
WBC: 10.7 10*3/uL — ABNORMAL HIGH (ref 4.0–10.5)
nRBC: 0 % (ref 0.0–0.2)

## 2017-12-25 LAB — WET PREP, GENITAL
Clue Cells Wet Prep HPF POC: NONE SEEN
Sperm: NONE SEEN
Yeast Wet Prep HPF POC: NONE SEEN

## 2017-12-25 LAB — URINALYSIS, ROUTINE W REFLEX MICROSCOPIC
Bacteria, UA: NONE SEEN
Bilirubin Urine: NEGATIVE
Glucose, UA: NEGATIVE mg/dL
Ketones, ur: NEGATIVE mg/dL
Nitrite: NEGATIVE
Protein, ur: 100 mg/dL — AB
RBC / HPF: >50 RBC/hpf — ABNORMAL HIGH (ref 0–5)
Specific Gravity, Urine: 1.024 (ref 1.005–1.030)
Squamous Epithelial / LPF: >50 — ABNORMAL HIGH (ref 0–5)
pH: 6 (ref 5.0–8.0)

## 2017-12-25 LAB — HCG, QUANTITATIVE, PREGNANCY: hCG, Beta Chain, Quant, S: 39 m[IU]/mL — ABNORMAL HIGH (ref <5)

## 2017-12-25 MED ORDER — METRONIDAZOLE 500 MG PO TABS
2000.0000 mg | ORAL_TABLET | Freq: Once | ORAL | Status: AC
  Administered 2017-12-25: 2000 mg via ORAL
  Filled 2017-12-25: qty 4

## 2017-12-25 NOTE — MAU Note (Signed)
Started spotting and cramping this morning.  Light pinkish red spotting

## 2017-12-25 NOTE — Discharge Instructions (Signed)
Expedited Partner Therapy:  °Information Sheet for Patients and Partners  °            ° °You have been offered expedited partner therapy (EPT). This information sheet contains important information and warnings you need to be aware of, so please read it carefully.  ° °Expedited Partner Therapy (EPT) is the clinical practice of treating the sexual partners of persons who receive chlamydia, gonorrhea, or trichomoniasis diagnoses by providing medications or prescriptions to the patient. Patients then provide partners with these therapies without the health-care provider having examined the partner. In other words, EPT is a convenient, fast and private way for patients to help their sexual partners get treated.  ° °Chlamydia and gonorrhea are bacterial infections you get from having sex with a person who is already infected. Trichomoniasis (or “trich”) is a very common sexually transmitted infection (STI) that is caused by infection with a protozoan parasite called Trichomonas vaginalis.  Many people with these infections don’t know it because they feel fine, but without treatment these infections can cause serious health problems, such as pelvic inflammatory disease, ectopic pregnancy, infertility and increased risk of HIV.  ° °It is important to get treated as soon as possible to protect your health, to avoid spreading these infections to others, and to prevent yourself from becoming re-infected. The good news is these infections can be easily cured with proper antibiotic medicine. The best way to take care of your self is to see a doctor or go to your local health department. If you are not able to see a doctor or other medical provider, you should take EPT.  ° ° °Recommended Medication: °EPT for Chlamydia:  Azithromycin (Zithromax) 1 gram orally in a single dose °EPT for Gonorrhea:  Cefixime (Suprax) 400 milligrams orally in a single dose PLUS azithromycin (Zithromax) 1 gram orally in a single dose °EPT for  Trichomoniasis:  Metronidazole (Flagyl) 2 grams orally in a single dose ° ° °These medicines are very safe. However, you should not take them if you have ever had an allergic reaction (like a rash) to any of these medicines: azithromycin (Zithromax), erythromycin, clarithromycin (Biaxin), metronidazole (Flagyl), tinidazole (Tindimax). If you are uncertain about whether you have an allergy, call your medical provider or pharmacist before taking this medicine. If you have a serious, long-term illness like kidney, liver or heart disease, colitis or stomach problems, or you are currently taking other prescription medication, talk to your provider before taking this medication.  ° °Women: If you have lower belly pain, pain during sex, vomiting, or a fever, do not take this medicine. Instead, you should see a medical provider to be certain you do not have pelvic inflammatory disease (PID). PID can be serious and lead to infertility, pregnancy problems or chronic pelvic pain.  ° °Pregnant Women: It is very important for you to see a doctor to get pregnancy services and pre-natal care. These antibiotics for EPT are safe for pregnant women, but you still need to see a medical provider as soon as possible. It is also important to note that Doxycycline is an alternative therapy for chlamydia, but it should not be taken by someone who is pregnant.  ° °Men: If you have pain or swelling in the testicles or a fever, do not take this medicine and see a medical provider.    ° °Men who have sex with men (MSM): MSM in [place] continue to experience high rates of syphilis and HIV. Many MSM with gonorrhea or   chlamydia could also have syphilis and/or HIV and not know it. If you are a man who has sex with other men, it is very important that you see a medical provider and are tested for HIV and syphilis. EPT is not recommended for gonorrhea for MSM.  Recommended treatment for gonorrhea for MSM is Rocephin (shot) AND azithromycin  due to decreased cure rate.  Please see your medical provider if this is the case.   ° °Along with this information sheet is a prescription for the medicine. If you receive a prescription it will be in your name and will indicate your date of birth, or it will be in the name of “Expedited Partner Therapy”.   In either case, you can have the prescription filled at a pharmacy. You will be responsible for the cost of the medicine, unless you have prescription drug coverage. In that case, you could provide your name so the pharmacy could bill your health plan.  ° °Take the medication as directed. Some people will have a mild, upset stomach, which does not last long. AVOID alcohol 24 hours after taking metronidazole (Flagyl) to reduce the possibility of a disulfiram-like reaction (severe vomiting and abdominal pain).  After taking the medicine, do not have sex for 7 days. Do not share this medicine or give it to anyone else. It is important to tell everyone you have had sex with in the last 60 days that they need to go and get tested for sexually transmitted infections.  ° °Ways to prevent these and other sexually transmitted infections (STIs):  ° °• Abstain from sex. This is the only sure way to avoid getting an STI.  °• Use barrier methods, such as condoms, consistently and correctly.  °• Limit the number of sexual partners.  °• Have regular physical exams, including testing for STIs.  ° °For more information about EPT or other issues pertaining to an STI, please contact your medical provider or the [place] [facility] at [phone] or [email or web address]   ° °

## 2017-12-25 NOTE — MAU Provider Note (Signed)
History     CSN: [id number]  Arrival date and time: 12/25/17 7035   First Provider Initiated Contact with Patient 12/25/17 0914      Chief Complaint  Patient presents with  . Vaginal Bleeding  . Abdominal Pain   Jessica Mcpherson is a 32 y.o. G5P1 at [redacted]w[redacted]d by LMP who presents to MAU with complaints of vaginal spotting and abdominal cramping. She reports taking 3 HPT three days ago that were positive then was seen in the office yesterday for confirmation. Reports spotting and cramping started this morning when she woke up. Describes the vaginal spotting as pinkish spotting when she wipes, denies having to wear pad for vaginal bleeding. Describes abdominal pain as lower abdominal cramping, rates pain 6/10- has not taken any medication for abdominal pain. Denies abdominal pain radiating to one side. Hx of 2 miscarriages, denies having a hx of ectopic pregnancy.    OB History    Gravida  5   Para  1   Term  1   Preterm      AB  2   Living  1     SAB  1   TAB      Ectopic      Multiple      Live Births  1           Past Medical History:  Diagnosis Date  . Bronchitis   . Chlamydia   . Gonorrhea   . Hypertension   . Miscarriage   . Trichomonas infection     Past Surgical History:  Procedure Laterality Date  . APPENDECTOMY    . CESAREAN SECTION    . LAPAROSCOPIC APPENDECTOMY N/A 12/30/2013   Procedure: APPENDECTOMY LAPAROSCOPIC;  Surgeon: Jackolyn Confer, MD;  Location: [facility];  Service: General;  Laterality: N/A;    Family History  Problem Relation Age of Onset  . Diabetes Mother   . Hypertension Mother   . Diabetes Maternal Grandmother   . Hypertension Maternal Grandmother   . Diabetes Maternal Grandfather   . Hypertension Father     Social History   Tobacco Use  . Smoking status: Current Every Day Smoker    Packs/day: 0.10    Types: Cigarettes  . Smokeless tobacco: Never Used  Substance Use Topics  . Alcohol use: No    Alcohol/week: 0.0  standard drinks  . Drug use: No    Allergies:  Allergies  Allergen Reactions  . Penicillins Hives and Other (See Comments)    CHILDHOOD ALLERGY Has patient had a PCN reaction causing immediate rash, facial/tongue/throat swelling, SOB or lightheadedness with hypotension: YES Has patient had a PCN reaction causing severe rash involving mucus membranes or skin necrosis: UNKNOWN Has patient had a PCN reaction that required hospitalization: YES Has patient had a PCN reaction occurring within the last 10 years: No If all of the above answers are "NO", then may proceed with Cephalosporin use.  Marland Kitchen Pepperoni [Pickled Meat] Hives    Medications Prior to Admission  Medication Sig Dispense Refill Last Dose  . acetaminophen (TYLENOL) 325 MG tablet Take 650 mg by mouth every 6 (six) hours as needed for mild pain or headache.   11/13/2017 at Unknown time  . clindamycin (CLEOCIN) 300 MG capsule Take 1 capsule (300 mg total) by mouth 4 (four) times daily. X 7 days (Patient not taking: Reported on 11/13/2017) 28 capsule 0 Not Taking at Unknown time  . diclofenac (VOLTAREN) 75 MG EC tablet Take 1 tablet (75 mg total)  by mouth 2 (two) times daily. (Patient not taking: Reported on 11/13/2017) 20 tablet 0 Not Taking at Unknown time  . fluconazole (DIFLUCAN) 150 MG tablet Take 150 mg by mouth once.   1 Past Month at Unknown time  . fluticasone (FLONASE) 50 MCG/ACT nasal spray Place 2 sprays into both nostrils daily. 16 g 0 > 1 month  . hydrochlorothiazide (HYDRODIURIL) 25 MG tablet Take 25 mg by mouth daily.    11/12/2017 at Unknown time  . ibuprofen (ADVIL,MOTRIN) 600 MG tablet Take 1 tablet (600 mg total) by mouth every 6 (six) hours as needed. (Patient not taking: Reported on 11/13/2017) 30 tablet 0 Not Taking at Unknown time  . ibuprofen (ADVIL,MOTRIN) 800 MG tablet Take 1 tablet (800 mg total) by mouth 3 (three) times daily. (Patient not taking: Reported on 11/13/2017) 21 tablet 0 Not Taking at Unknown time  .  methocarbamol (ROBAXIN) 500 MG tablet Take 1 tablet (500 mg total) by mouth at bedtime. (Patient not taking: Reported on 11/13/2017) 10 tablet 0 Not Taking at Unknown time  . nabumetone (RELAFEN) 500 MG tablet Take 1.5 tablets (750 mg total) by mouth 2 (two) times daily as needed. (Patient not taking: Reported on 11/13/2017) 60 tablet 1 Not Taking at Unknown time    Review of Systems  Constitutional: Negative.   Respiratory: Negative.   Cardiovascular: Negative.   Gastrointestinal: Positive for abdominal pain. Negative for constipation, diarrhea, nausea and vomiting.  Genitourinary: Positive for vaginal bleeding. Negative for difficulty urinating, dysuria, frequency, pelvic pain and urgency.  Neurological: Negative.    Physical Exam   Blood pressure 130/82, pulse 72, temperature 98.3 F (36.8 C), temperature source Oral, resp. rate 16, last menstrual period 11/25/2017.  Physical Exam  Nursing note and vitals reviewed. Constitutional: She is oriented to person, place, and time. She appears well-developed and well-nourished. No distress.  Cardiovascular: Normal rate, regular rhythm and normal heart sounds.  Respiratory: Effort normal and breath sounds normal. No respiratory distress. She has no wheezes.  GI: Soft. She exhibits no distension. There is no abdominal tenderness. There is no rebound and no guarding.  Genitourinary:    Vaginal bleeding present.  There is bleeding in the vagina.    Genitourinary Comments: Pelvic examination: cervix pink, visually closed, small amount of bright red vaginal bleeding pooling under cervix, scant trickle of active bleeding coming from cervical os. Negative CMT, no abnormalities of adnexa palpated.    Neurological: She is alert and oriented to person, place, and time.  Psychiatric: She has a normal mood and affect. Her behavior is normal. Thought content normal.    MAU Course  Procedures  MDM Orders Placed This Encounter  Procedures  . Wet prep,  genital  . US OB LESS THAN 14 WEEKS WITH OB TRANSVAGINAL  . Urinalysis, Routine w reflex microscopic  . CBC  . hCG, quantitative, pregnancy  . HIV Antibody (routine testing w rflx)   Results for orders placed or performed during the hospital encounter of 12/25/17 (from the past 24 hour(s))  Urinalysis, Routine w reflex microscopic     Status: Abnormal   Collection Time: 12/25/17  8:56 AM  Result Value Ref Range   Color, Urine YELLOW YELLOW   APPearance CLOUDY (A) CLEAR   Specific Gravity, Urine 1.024 1.005 - 1.030   pH 6.0 5.0 - 8.0   Glucose, UA NEGATIVE NEGATIVE mg/dL   Hgb urine dipstick LARGE (A) NEGATIVE   Bilirubin Urine NEGATIVE NEGATIVE   Ketones, ur NEGATIVE NEGATIVE mg/dL  Protein, ur 100 (A) NEGATIVE mg/dL   Nitrite NEGATIVE NEGATIVE   Leukocytes, UA TRACE (A) NEGATIVE   RBC / HPF >50 (H) 0 - 5 RBC/hpf   WBC, UA 6-10 0 - 5 WBC/hpf   Bacteria, UA NONE SEEN NONE SEEN   Squamous Epithelial / LPF >50 (H) 0 - 5   Mucus PRESENT   Wet prep, genital     Status: Abnormal   Collection Time: 12/25/17  9:26 AM  Result Value Ref Range   Yeast Wet Prep HPF POC NONE SEEN NONE SEEN   Trich, Wet Prep PRESENT (A) NONE SEEN   Clue Cells Wet Prep HPF POC NONE SEEN NONE SEEN   WBC, Wet Prep HPF POC MANY (A) NONE SEEN   Sperm NONE SEEN   CBC     Status: Abnormal   Collection Time: 12/25/17 10:14 AM  Result Value Ref Range   WBC 10.7 (H) 4.0 - 10.5 K/uL   RBC 4.46 3.87 - 5.11 MIL/uL   Hemoglobin 13.2 12.0 - 15.0 g/dL   HCT 40.1 36.0 - 46.0 %   MCV 89.9 80.0 - 100.0 fL   MCH 29.6 26.0 - 34.0 pg   MCHC 32.9 30.0 - 36.0 g/dL   RDW 13.6 11.5 - 15.5 %   Platelets 318 150 - 400 K/uL   nRBC 0.0 0.0 - 0.2 %  hCG, quantitative, pregnancy     Status: Abnormal   Collection Time: 12/25/17 10:14 AM  Result Value Ref Range   hCG, Beta Chain, Quant, S 39 (H) <5 mIU/mL   US Ob Less Than 14 Weeks With Ob Transvaginal  Result Date: 12/25/2017 CLINICAL DATA:  Vaginal spotting with reported  positive pregnancy test EXAM: OBSTETRIC <14 WK [place] AND TRANSVAGINAL OB US TECHNIQUE: Both transabdominal and transvaginal ultrasound examinations were performed for complete evaluation of the gestation as well as the maternal uterus, adnexal regions, and pelvic cul-de-sac. Transvaginal technique was performed to assess early pregnancy. COMPARISON:  None. FINDINGS: Intrauterine gestational sac: Not visualized Yolk sac:  Not visualized Embryo:  Not visualized Cardiac Activity: Not visualized Subchorionic hemorrhage:  None visualized. Maternal uterus/adnexae: Cervical os is closed. No intrauterine mass. Endometrium does not appear thickened. Right ovary measures 2.7 x 2.2 x 2.7 cm. Left ovary measures 1.9 x 2.0 x 1.6 cm. There is no extrauterine pelvic or adnexal mass. No free pelvic fluid evident. IMPRESSION: No intrauterine gestation. No abnormality noted on this study. Given reported positive pregnancy test, differential considerations at this time must include intrauterine gestation too early to be seen by either transabdominal or transvaginal technique; recent spontaneous abortion; ectopic gestation. In this regard, close clinical and laboratory surveillance advised. Timing of repeat imaging study in part will depend on beta HCG values going forward. Electronically Signed   By: Lowella Grip III M.D.   On: 12/25/2017 10:48   Discussed results of labs and [place] with patient - notified of positive Trich. Treatment in MAU for Trich included 2,000mg  of Flagyl. Educated and discussed expedited partner therapy, Rx for partner given to patient prior to discharge home. Ectopic precautions given, patient to follow up in 48 hours for repeat HCG, patient verbalizes understanding. Return to MAU as needed for reasons discussed. Follow up as scheduled. Pt stable prior to discharge home.   Assessment and Plan   1. Pregnancy of unknown anatomic location   2. Vaginal spotting   3. Abdominal pain during pregnancy in first  trimester   4. Trichomonal vaginitis during pregnancy in first  trimester   5. [redacted] weeks gestation of pregnancy    Discharge home  Follow up on Sunday 12/15 in MAU for stat HCG  Expedited partner therapy and Rx  Return to MAU as needed  Follow up as scheduled  Ectopic precautions   Follow-up Information    [facility]. Go on 12/27/2017.   Specialty:  Obstetrics and Gynecology Why:  Return to MAU on Sunday 12/15 around 11am for repeat labs  Contact information: [address] [id number] [place] 12/25/2017, 11:49 AM

## 2017-12-26 LAB — HIV ANTIBODY (ROUTINE TESTING W REFLEX): HIV Screen 4th Generation wRfx: NONREACTIVE

## 2017-12-27 ENCOUNTER — Inpatient Hospital Stay (HOSPITAL_COMMUNITY)
Admission: AD | Admit: 2017-12-27 | Discharge: 2017-12-27 | Disposition: A | Discharge status: Home / Self Care | Payer: 59 | Source: Ambulatory Visit | Attending: Obstetrics & Gynecology | Admitting: Obstetrics & Gynecology

## 2017-12-27 DIAGNOSIS — O3680X0 Pregnancy with inconclusive fetal viability, not applicable or unspecified: Secondary | ICD-10-CM

## 2017-12-27 DIAGNOSIS — R109 Unspecified abdominal pain: Secondary | ICD-10-CM | POA: Diagnosis not present

## 2017-12-27 DIAGNOSIS — A5901 Trichomonal vulvovaginitis: Secondary | ICD-10-CM | POA: Diagnosis not present

## 2017-12-27 DIAGNOSIS — O039 Complete or unspecified spontaneous abortion without complication: Secondary | ICD-10-CM

## 2017-12-27 DIAGNOSIS — Z3A01 Less than 8 weeks gestation of pregnancy: Secondary | ICD-10-CM

## 2017-12-27 DIAGNOSIS — O209 Hemorrhage in early pregnancy, unspecified: Secondary | ICD-10-CM | POA: Diagnosis not present

## 2017-12-27 LAB — HCG, QUANTITATIVE, PREGNANCY: hCG, Beta Chain, Quant, S: 6 m[IU]/mL — ABNORMAL HIGH (ref <5)

## 2017-12-27 LAB — CULTURE, OB URINE: Culture: 10000 — AB

## 2017-12-27 NOTE — Discharge Instructions (Signed)
Miscarriage A miscarriage is the sudden loss of an unborn baby (fetus) before the 20th week of pregnancy. Most miscarriages happen in the first 3 months of pregnancy. Sometimes, it happens before a woman even knows she is pregnant. A miscarriage is also called a "spontaneous miscarriage" or "early pregnancy loss." Having a miscarriage can be an emotional experience. Talk with your caregiver about any questions you may have about miscarrying, the grieving process, and your future pregnancy plans. What are the causes?  Problems with the fetal chromosomes that make it impossible for the baby to develop normally. Problems with the baby's genes or chromosomes are most often the result of errors that occur, by chance, as the embryo divides and grows. The problems are not inherited from the parents.  Infection of the cervix or uterus.  Hormone problems.  Problems with the cervix, such as having an incompetent cervix. This is when the tissue in the cervix is not strong enough to hold the pregnancy.  Problems with the uterus, such as an abnormally shaped uterus, uterine fibroids, or congenital abnormalities.  Certain medical conditions.  Smoking, drinking alcohol, or taking illegal drugs.  Trauma. Often, the cause of a miscarriage is unknown. What are the signs or symptoms?  Vaginal bleeding or spotting, with or without cramps or pain.  Pain or cramping in the abdomen or lower back.  Passing fluid, tissue, or blood clots from the vagina. How is this diagnosed? Your caregiver will perform a physical exam. You may also have an ultrasound to confirm the miscarriage. Blood or urine tests may also be ordered. How is this treated?  Sometimes, treatment is not necessary if you naturally pass all the fetal tissue that was in the uterus. If some of the fetus or placenta remains in the body (incomplete miscarriage), tissue left behind may become infected and must be removed. Usually, a dilation and  curettage (D and C) procedure is performed. During a D and C procedure, the cervix is widened (dilated) and any remaining fetal or placental tissue is gently removed from the uterus.  Antibiotic medicines are prescribed if there is an infection. Other medicines may be given to reduce the size of the uterus (contract) if there is a lot of bleeding.  If you have Rh negative blood and your baby was Rh positive, you will need a Rh immunoglobulin shot. This shot will protect any future baby from having Rh blood problems in future pregnancies. Follow these instructions at home:  Your caregiver may order bed rest or may allow you to continue light activity. Resume activity as directed by your caregiver.  Have someone help with home and family responsibilities during this time.  Keep track of the number of sanitary pads you use each day and how soaked (saturated) they are. Write down this information.  Do not use tampons. Do not douche or have sexual intercourse until approved by your caregiver.  Only take over-the-counter or prescription medicines for pain or discomfort as directed by your caregiver.  Do not take aspirin. Aspirin can cause bleeding.  Keep all follow-up appointments with your caregiver.  If you or your partner have problems with grieving, talk to your caregiver or seek counseling to help cope with the pregnancy loss. Allow enough time to grieve before trying to get pregnant again. Get help right away if:  You have severe cramps or pain in your back or abdomen.  You have a fever.  You pass large blood clots (walnut-sized or larger) ortissue from your  vagina. Save any tissue for your caregiver to inspect.  Your bleeding increases.  You have a thick, bad-smelling vaginal discharge.  You become lightheaded, weak, or you faint.  You have chills. This information is not intended to replace advice given to you by your health care provider. Make sure you discuss any questions  you have with your health care provider. Document Released: 06/25/2000 Document Revised: 06/07/2015 Document Reviewed: 02/18/2011 Elsevier Interactive Patient Education  2017 [employer].   Contraception Choices Contraception, also called birth control, refers to methods or devices that prevent pregnancy. Hormonal methods Contraceptive implant A contraceptive implant is a thin, plastic tube that contains a hormone. It is inserted into the upper part of the arm. It can remain in place for up to 3 years. Progestin-only injections Progestin-only injections are injections of progestin, a synthetic form of the hormone progesterone. They are given every 3 months by a health care provider. Birth control pills Birth control pills are pills that contain hormones that prevent pregnancy. They must be taken once a day, preferably at the same time each day. Birth control patch The birth control patch contains hormones that prevent pregnancy. It is placed on the skin and must be changed once a week for three weeks and removed on the fourth week. A prescription is needed to use this method of contraception. Vaginal ring A vaginal ring contains hormones that prevent pregnancy. It is placed in the vagina for three weeks and removed on the fourth week. After that, the process is repeated with a new ring. A prescription is needed to use this method of contraception. Emergency contraceptive Emergency contraceptives prevent pregnancy after unprotected sex. They come in pill form and can be taken up to 5 days after sex. They work best the sooner they are taken after having sex. Most emergency contraceptives are available without a prescription. This method should not be used as your only form of birth control. Barrier methods Female condom A female condom is a thin sheath that is worn over the penis during sex. Condoms keep sperm from going inside a woman's body. They can be used with a spermicide to increase their  effectiveness. They should be disposed after a single use. Female condom A female condom is a soft, loose-fitting sheath that is put into the vagina before sex. The condom keeps sperm from going inside a woman's body. They should be disposed after a single use. Diaphragm A diaphragm is a soft, dome-shaped barrier. It is inserted into the vagina before sex, along with a spermicide. The diaphragm blocks sperm from entering the uterus, and the spermicide kills sperm. A diaphragm should be left in the vagina for 6-8 hours after sex and removed within 24 hours. A diaphragm is prescribed and fitted by a health care provider. A diaphragm should be replaced every 1-2 years, after giving birth, after gaining more than 15 lb (6.8 kg), and after pelvic surgery. Cervical cap A cervical cap is a round, soft latex or plastic cup that fits over the cervix. It is inserted into the vagina before sex, along with spermicide. It blocks sperm from entering the uterus. The cap should be left in place for 6-8 hours after sex and removed within 48 hours. A cervical cap must be prescribed and fitted by a health care provider. It should be replaced every 2 years. Sponge A sponge is a soft, circular piece of polyurethane foam with spermicide on it. The sponge helps block sperm from entering the uterus, and the  spermicide kills sperm. To use it, you make it wet and then insert it into the vagina. It should be inserted before sex, left in for at least 6 hours after sex, and removed and thrown away within 30 hours. Spermicides Spermicides are chemicals that kill or block sperm from entering the cervix and uterus. They can come as a cream, jelly, suppository, foam, or tablet. A spermicide should be inserted into the vagina with an applicator at least 40-98 minutes before sex to allow time for it to work. The process must be repeated every time you have sex. Spermicides do not require a prescription. Intrauterine  contraception Intrauterine device (IUD) An IUD is a T-shaped device that is put in a woman's uterus. There are two types:  Hormone IUD.This type contains progestin, a synthetic form of the hormone progesterone. This type can stay in place for 3-5 years.  Copper IUD.This type is wrapped in copper wire. It can stay in place for 10 years.  Permanent methods of contraception Female tubal ligation In this method, a woman's fallopian tubes are sealed, tied, or blocked during surgery to prevent eggs from traveling to the uterus. Hysteroscopic sterilization In this method, a small, flexible insert is placed into each fallopian tube. The inserts cause scar tissue to form in the fallopian tubes and block them, so sperm cannot reach an egg. The procedure takes about 3 months to be effective. Another form of birth control must be used during those 3 months. Female sterilization This is a procedure to tie off the tubes that carry sperm (vasectomy). After the procedure, the man can still ejaculate fluid (semen). Natural planning methods Natural family planning In this method, a couple does not have sex on days when the woman could become pregnant. Calendar method This means keeping track of the length of each menstrual cycle, identifying the days when pregnancy can happen, and not having sex on those days. Ovulation method In this method, a couple avoids sex during ovulation. Symptothermal method This method involves not having sex during ovulation. The woman typically checks for ovulation by watching changes in her temperature and in the consistency of cervical mucus. Post-ovulation method In this method, a couple waits to have sex until after ovulation. Summary  Contraception, also called birth control, means methods or devices that prevent pregnancy.  Hormonal methods of contraception include implants, injections, pills, patches, vaginal rings, and emergency contraceptives.  Barrier methods of  contraception can include female condoms, female condoms, diaphragms, cervical caps, sponges, and spermicides.  There are two types of IUDs (intrauterine devices). An IUD can be put in a woman's uterus to prevent pregnancy for 3-5 years.  Permanent sterilization can be done through a procedure for males, females, or both.  Natural family planning methods involve not having sex on days when the woman could become pregnant. This information is not intended to replace advice given to you by your health care provider. Make sure you discuss any questions you have with your health care provider. Document Released: 12/30/2004 Document Revised: 02/02/2016 Document Reviewed: 02/02/2016 Elsevier Interactive Patient Education  2018 [employer].

## 2017-12-27 NOTE — MAU Provider Note (Signed)
History    First Provider Initiated Contact with Patient 12/27/17 1226      Chief Complaint:  Follow-up and Vaginal Bleeding   Jessica Mcpherson is  32 y.o. [id number] Patient's last menstrual period was 11/25/2017 (within days).. Patient is here for follow up of quantitative HCG and ongoing surveillance of pregnancy status.   She is [redacted]w[redacted]d weeks gestation  by LMP.    Since her last visit, the patient is without new complaint.     ROS Abdominal Pain: None Vaginal bleeding: Like a period.   Passage of clots or tissue: Denies Dizziness: Denies  O POS  Her previous Quantitative HCG values are: 12/13: 39  Physical Exam   Patient Vitals for the past 24 hrs:  BP Temp Temp src Pulse Resp Height Weight  12/27/17 1141 121/73 98.1 F (36.7 C) Oral 69 18 5' (1.524 m) 76.5 kg   Constitutional: Well-nourished female in no apparent distress. No pallor Neuro: Alert and oriented 4 Cardiovascular: Normal rate Respiratory: Normal effort and rate Abdomen: Soft, nontender Gynecological Exam: exam declined by the patient  Labs: Results for orders placed or performed during the hospital encounter of 12/27/17 (from the past 24 hour(s))  hCG, quantitative, pregnancy   Collection Time: 12/27/17 11:24 AM  Result Value Ref Range   hCG, Beta Chain, Quant, S 6 (H) <5 mIU/mL    Ultrasound Studies:   NA  MAU course/MDM: Quantitative hCG ordered  Bleeding in early pregnancy with drop in Quant, but hemodynamically stable.  Assessment:  [redacted]w[redacted]d weeks gestation w/ significant drop in Quant C/W miscarriage  Plan: Discharge home in stable condition. Bleeding precautions Support given F/U at [facility] in 2 weeks  MAU PRN for emergencies Allergies as of 12/27/2017      Reactions   Penicillins Hives, Other (See Comments)   CHILDHOOD ALLERGY Has patient had a PCN reaction causing immediate rash, facial/tongue/throat swelling, SOB or lightheadedness with hypotension: YES Has patient had a PCN reaction  causing severe rash involving mucus membranes or skin necrosis: UNKNOWN Has patient had a PCN reaction that required hospitalization: YES Has patient had a PCN reaction occurring within the last 10 years: No If all of the above answers are "NO", then may proceed with Cephalosporin use.   Pepperoni [pickled Meat] Hives      Medication List    TAKE these medications   acetaminophen 325 MG tablet Commonly known as:  TYLENOL Take 650 mg by mouth every 6 (six) hours as needed for mild pain or headache.   fluticasone 50 MCG/ACT nasal spray Commonly known as:  FLONASE Place 2 sprays into both nostrils daily.       Tamala Julian, [place], [place] 12/27/2017, 12:27 PM  2/3

## 2017-12-27 NOTE — MAU Note (Signed)
Here for follow up HCG  Increased bleeding the past 2 days, changing a pad every 2 hours. States it is like a heavy period with clots  No pain

## 2017-12-28 LAB — GC/CHLAMYDIA PROBE AMP (~~LOC~~) NOT AT ARMC
Chlamydia: NEGATIVE
Neisseria Gonorrhea: NEGATIVE

## 2018-01-11 ENCOUNTER — Ambulatory Visit: Payer: 59 | Admitting: Obstetrics and Gynecology

## 2018-01-11 ENCOUNTER — Encounter: Payer: Self-pay | Admitting: Obstetrics and Gynecology

## 2018-01-11 DIAGNOSIS — Z09 Encounter for follow-up examination after completed treatment for conditions other than malignant neoplasm: Secondary | ICD-10-CM

## 2018-01-14 NOTE — Progress Notes (Signed)
Patient did not keep her GYN appointment for 01/11/2018  Durene Romans MD Attending [facility] [employer])

## 2018-03-21 ENCOUNTER — Emergency Department (HOSPITAL_COMMUNITY)
Admission: EM | Admit: 2018-03-21 | Discharge: 2018-03-21 | Disposition: A | Discharge status: Home / Self Care | Payer: 59 | Attending: Emergency Medicine | Admitting: Emergency Medicine

## 2018-03-21 ENCOUNTER — Encounter (HOSPITAL_COMMUNITY): Payer: Self-pay

## 2018-03-21 ENCOUNTER — Other Ambulatory Visit: Payer: Self-pay

## 2018-03-21 DIAGNOSIS — I10 Essential (primary) hypertension: Secondary | ICD-10-CM | POA: Diagnosis not present

## 2018-03-21 DIAGNOSIS — R6889 Other general symptoms and signs: Secondary | ICD-10-CM

## 2018-03-21 DIAGNOSIS — F17210 Nicotine dependence, cigarettes, uncomplicated: Secondary | ICD-10-CM | POA: Insufficient documentation

## 2018-03-21 DIAGNOSIS — J111 Influenza due to unidentified influenza virus with other respiratory manifestations: Secondary | ICD-10-CM | POA: Diagnosis not present

## 2018-03-21 DIAGNOSIS — Z79899 Other long term (current) drug therapy: Secondary | ICD-10-CM | POA: Diagnosis not present

## 2018-03-21 NOTE — ED Triage Notes (Signed)
Pt c/o of flu-like symptoms since Friday.  Pt states she feel improvement in her symptoms since.  Pt states she had a temperature of 103 on Friday.

## 2018-03-21 NOTE — ED Provider Notes (Signed)
[facility] DEPT Provider Note   CSN: [id number] Arrival date & time: 03/21/18  1049    History   Chief Complaint No chief complaint on file.   HPI Jessica Mcpherson is a 33 y.o. female.     The history is provided by the patient.  Cough  Severity:  Moderate Onset quality:  Gradual Duration:  2 days Timing:  Intermittent Progression:  Unchanged Chronicity:  New Smoker: yes   Relieved by:  Nothing Worsened by:  Nothing Associated symptoms: chills and fever   Associated symptoms: no chest pain and no shortness of breath    Patient reports flulike symptoms for up to 2 days.  She reports fever, cough, body aches.  She works at the [employer], but has no known sick contacts as well as no travel No chest pain or shortness of breath.  Past Medical History:  Diagnosis Date  . Hypertension   . Miscarriage     Patient Active Problem List   Diagnosis Date Noted  . Acute appendicitis 12/30/2013    Past Surgical History:  Procedure Laterality Date  . APPENDECTOMY    . CESAREAN SECTION    . LAPAROSCOPIC APPENDECTOMY N/A 12/30/2013   Procedure: APPENDECTOMY LAPAROSCOPIC;  Surgeon: Jackolyn Confer, MD;  Location: [facility] ORS;  Service: General;  Laterality: N/A;     OB History    Gravida  5   Para  1   Term  1   Preterm      AB  2   Living  1     SAB  1   TAB      Ectopic      Multiple      Live Births  1            Home Medications    Prior to Admission medications   Medication Sig Start Date End Date Taking? Authorizing Provider  acetaminophen (TYLENOL) 325 MG tablet Take 650 mg by mouth every 6 (six) hours as needed for mild pain or headache.    [provider]  fluticasone (FLONASE) 50 MCG/ACT nasal spray Place 2 sprays into both nostrils daily. 01/24/17   Street, [place], PA-C    Family History Family History  Problem Relation Age of Onset  . Diabetes Mother   . Hypertension Mother   . Diabetes  Maternal Grandmother   . Hypertension Maternal Grandmother   . Diabetes Maternal Grandfather   . Hypertension Father     Social History Social History   Tobacco Use  . Smoking status: Current Every Day Smoker    Packs/day: 0.10    Types: Cigarettes  . Smokeless tobacco: Never Used  Substance Use Topics  . Alcohol use: No    Alcohol/week: 0.0 standard drinks  . Drug use: No     Allergies   Penicillins and Pepperoni [pickled meat]   Review of Systems Review of Systems  Constitutional: Positive for chills, fatigue and fever.  Respiratory: Positive for cough. Negative for shortness of breath.   Cardiovascular: Negative for chest pain.  Gastrointestinal: Negative for vomiting.  All other systems reviewed and are negative.    Physical Exam Updated Vital Signs BP 123/85 (BP Location: Right Arm)   Pulse 81   Temp 98.1 F (36.7 C) (Oral)   Resp 16   Ht 1.524 m (5')   Wt 73 kg   LMP 03/20/2018   SpO2 100%   BMI 31.44 kg/m   Physical Exam CONSTITUTIONAL: Well developed/well nourished HEAD:  Normocephalic/atraumatic EYES: EOMI/PERRL ENMT: Mucous membranes moist, fluid noted behind each ear, no signs of OM. Uvula midline without erythema or exudates NECK: supple no meningeal signs SPINE/BACK:entire spine nontender CV: S1/S2 noted, no murmurs/rubs/gallops noted LUNGS: Lungs are clear to auscultation bilaterally, no apparent distress ABDOMEN: soft, nontender, no rebound or guarding, bowel sounds noted throughout abdomen GU:no cva tenderness NEURO: Pt is awake/alert/appropriate, moves all extremitiesx4.  No facial droop.   EXTREMITIES: pulses normal/equal, full ROM SKIN: warm, color normal PSYCH: no abnormalities of mood noted, alert and oriented to situation   ED Treatments / Results  Labs (all labs ordered are listed, but only abnormal results are displayed) Labs Reviewed - No data to display  EKG None  Radiology No results found.  Procedures Procedures    Medications Ordered in ED Medications - No data to display   Initial Impression / Assessment and Plan / ED Course  I have reviewed the triage vital signs and the nursing notes.         Patient with probable influenza.  No fevers this time.  She reports overall she is feeling improved over the past 2 days.  Will discharge home.  We discussed return precautions  Final Clinical Impressions(s) / ED Diagnoses   Final diagnoses:  Flu-like symptoms    ED Discharge Orders    None       Ripley Fraise, MD 03/21/18 1134

## 2018-06-14 ENCOUNTER — Encounter: Payer: Self-pay | Admitting: *Deleted

## 2018-06-29 ENCOUNTER — Encounter (HOSPITAL_COMMUNITY): Payer: Self-pay

## 2018-06-29 ENCOUNTER — Emergency Department (HOSPITAL_COMMUNITY)
Admission: EM | Admit: 2018-06-29 | Discharge: 2018-06-29 | Disposition: A | Discharge status: Home / Self Care | Payer: 59 | Attending: Emergency Medicine | Admitting: Emergency Medicine

## 2018-06-29 ENCOUNTER — Other Ambulatory Visit: Payer: Self-pay

## 2018-06-29 DIAGNOSIS — F17210 Nicotine dependence, cigarettes, uncomplicated: Secondary | ICD-10-CM | POA: Insufficient documentation

## 2018-06-29 DIAGNOSIS — I10 Essential (primary) hypertension: Secondary | ICD-10-CM | POA: Diagnosis not present

## 2018-06-29 DIAGNOSIS — N898 Other specified noninflammatory disorders of vagina: Secondary | ICD-10-CM | POA: Diagnosis present

## 2018-06-29 DIAGNOSIS — Z79899 Other long term (current) drug therapy: Secondary | ICD-10-CM | POA: Diagnosis not present

## 2018-06-29 DIAGNOSIS — B373 Candidiasis of vulva and vagina: Secondary | ICD-10-CM | POA: Insufficient documentation

## 2018-06-29 DIAGNOSIS — B3731 Acute candidiasis of vulva and vagina: Secondary | ICD-10-CM

## 2018-06-29 LAB — URINALYSIS, ROUTINE W REFLEX MICROSCOPIC
Bacteria, UA: NONE SEEN
Bilirubin Urine: NEGATIVE
Glucose, UA: NEGATIVE mg/dL
Hgb urine dipstick: NEGATIVE
Ketones, ur: NEGATIVE mg/dL
Nitrite: NEGATIVE
Protein, ur: NEGATIVE mg/dL
Specific Gravity, Urine: 1.006 (ref 1.005–1.030)
pH: 7 (ref 5.0–8.0)

## 2018-06-29 LAB — PREGNANCY, URINE: Preg Test, Ur: NEGATIVE

## 2018-06-29 LAB — WET PREP, GENITAL
Clue Cells Wet Prep HPF POC: NONE SEEN
Sperm: NONE SEEN
Trich, Wet Prep: NONE SEEN

## 2018-06-29 MED ORDER — FLUCONAZOLE 150 MG PO TABS: ORAL_TABLET | ORAL | 0 refills | Status: DC

## 2018-06-29 NOTE — ED Provider Notes (Signed)
[facility] EMERGENCY DEPARTMENT Provider Note   CSN: [id number] Arrival date & time: 06/29/18  9323     History   Chief Complaint Chief Complaint  Patient presents with  . Vaginal Discharge    HPI Jessica Mcpherson is a 33 y.o. female with a history of hypertension and tobacco abuse who presents to the emergency department with complaints of vaginal irritation for the past 1 week.  Patient states she is having clear to white vaginal discharge, irritation at the vaginal introitus, & pruritus. She notes she has burning to the vaginal introitus with urination, not necessarily dysuria.  She has been urinating more frequently.  She was seen in urgent care for similar symptoms a few days prior and was placed on antifungal vaginal suppositories, 1 application at bedtime for 1 week.  She states she is used these for the past 3 evenings without improvement in her symptoms.  There was no pelvic exam performed in urgent care.  Denies fever, chills, nausea, vomiting, abdominal pain, pelvic pain, or vaginal bleeding.  She is currently sexually active with one female partner in a monogamous relationship without use of protection she denies concern for STDs.     HPI  Past Medical History:  Diagnosis Date  . Hypertension   . Miscarriage     Patient Active Problem List   Diagnosis Date Noted  . Acute appendicitis 12/30/2013    Past Surgical History:  Procedure Laterality Date  . APPENDECTOMY    . CESAREAN SECTION    . LAPAROSCOPIC APPENDECTOMY N/A 12/30/2013   Procedure: APPENDECTOMY LAPAROSCOPIC;  Surgeon: Jackolyn Confer, MD;  Location: [facility];  Service: General;  Laterality: N/A;     OB History    Gravida  5   Para  1   Term  1   Preterm      AB  2   Living  1     SAB  1   TAB      Ectopic      Multiple      Live Births  1            Home Medications    Prior to Admission medications   Medication Sig Start Date End Date Taking? Authorizing  Provider  acetaminophen (TYLENOL) 325 MG tablet Take 650 mg by mouth every 6 (six) hours as needed for mild pain or headache.    [provider]  fluticasone (FLONASE) 50 MCG/ACT nasal spray Place 2 sprays into both nostrils daily. 01/24/17   Street, [place], PA-C    Family History Family History  Problem Relation Age of Onset  . Diabetes Mother   . Hypertension Mother   . Diabetes Maternal Grandmother   . Hypertension Maternal Grandmother   . Diabetes Maternal Grandfather   . Hypertension Father     Social History Social History   Tobacco Use  . Smoking status: Current Every Day Smoker    Packs/day: 0.10    Types: Cigarettes  . Smokeless tobacco: Never Used  Substance Use Topics  . Alcohol use: No    Alcohol/week: 0.0 standard drinks  . Drug use: No     Allergies   Penicillins and Pepperoni [pickled meat]   Review of Systems Review of Systems  Constitutional: Negative for chills and fever.  Respiratory: Negative for shortness of breath.   Cardiovascular: Negative for chest pain.  Gastrointestinal: Negative for abdominal distention, constipation, diarrhea, nausea and vomiting.  Genitourinary: Positive for frequency, vaginal discharge and vaginal  pain. Negative for dysuria, hematuria, pelvic pain and vaginal bleeding.  All other systems reviewed and are negative.    Physical Exam Updated Vital Signs BP (!) 149/83 (BP Location: Right Arm)   Pulse 85   Temp 97.9 F (36.6 C) (Oral)   Resp 16   Ht 5' (1.524 m)   Wt 76.2 kg   LMP 03/20/2018 (LMP Unknown)   SpO2 100%   Breastfeeding No   BMI 32.81 kg/m   Physical Exam Vitals signs and nursing note reviewed. Exam conducted with a chaperone present.  Constitutional:      General: She is not in acute distress.    Appearance: She is well-developed. She is not toxic-appearing.  HENT:     Head: Normocephalic and atraumatic.  Eyes:     General:        Right eye: No discharge.        Left eye: No  discharge.     Conjunctiva/sclera: Conjunctivae normal.  Neck:     Musculoskeletal: Neck supple.  Cardiovascular:     Rate and Rhythm: Normal rate and regular rhythm.  Pulmonary:     Effort: Pulmonary effort is normal. No respiratory distress.     Breath sounds: Normal breath sounds. No wheezing, rhonchi or rales.  Abdominal:     General: There is no distension.     Palpations: Abdomen is soft.     Tenderness: There is no abdominal tenderness. There is no guarding or rebound.  Genitourinary:    Exam position: Supine.     Vagina: Vaginal discharge (thick white) present.     Cervix: No cervical motion tenderness or friability.     Adnexa:        Right: No mass or tenderness.         Left: No mass or tenderness.       Comments: Sharyn Lull EDT present as chaperone.  Mild erythema noted at the vaginal introitus. No palpable fluctuance/abscess. No vesicular lesions or ulcerations.   Skin:    General: Skin is warm and dry.     Findings: No rash.  Neurological:     Mental Status: She is alert.     Comments: Clear speech.   Psychiatric:        Behavior: Behavior normal.    ED Treatments / Results  Labs (all labs ordered are listed, but only abnormal results are displayed) Labs Reviewed  WET PREP, GENITAL - Abnormal; Notable for the following components:      Result Value   Yeast Wet Prep HPF POC PRESENT (*)    WBC, Wet Prep HPF POC MANY (*)    All other components within normal limits  URINALYSIS, ROUTINE W REFLEX MICROSCOPIC - Abnormal; Notable for the following components:   Color, Urine STRAW (*)    Leukocytes,Ua LARGE (*)    All other components within normal limits  URINE CULTURE  PREGNANCY, URINE  RPR  HIV ANTIBODY (ROUTINE TESTING W REFLEX)  GC/CHLAMYDIA PROBE AMP ([place]) NOT AT [facility]    EKG    Radiology No results found.  Procedures Procedures (including critical care time)  Medications Ordered in ED Medications - No data to display   Initial  Impression / Assessment and Plan / ED Course  I have reviewed the triage vital signs and the nursing notes.  Pertinent labs & imaging results that were available during my care of the patient were reviewed by me and considered in my medical decision making (see chart for details).  Patient presents to the emergency department with complaints of vaginal discharge and irritation.  Patient is nontoxic-appearing, no apparent distress, vitals notable for mildly elevated blood pressure, doubt HTN emergency.  She was recently seen at an urgent care and placed on intravaginal antifungals for possible yeast infection w/o pelvic exam being performed.  She has no abdominal or pelvic pain, no abdominal, adnexal, or cervical motion tenderness, do not suspect PID.  No vesicular or ulcerated lesions to suggest herpes.  Urinalysis shows large leukocytes, otherwise unremarkable, her presentation does not really seem consistent with a UTI, will send this for culture. Preg test negative. Wet prep shows yeast which I suspect is the cause of her symptoms.  Given she is having no improvement with intravaginal therapy will prescribe Diflucan 150 mg today repeat in 72 hours as needed. OBGYN follow up.  She was tested for gonorrhea, chlamydia, HIV, and syphilis, she is aware of pending test results, aware if positive will need to seek treatment and inform all sexual partners. I discussed results, treatment plan, need for follow-up, and return precautions with the patient. Provided opportunity for questions, patient confirmed understanding and is in agreement with plan.     Final Clinical Impressions(s) / ED Diagnoses   Final diagnoses:  Yeast vaginitis    ED Discharge Orders         Ordered    fluconazole (DIFLUCAN) 150 MG tablet     06/29/18 [address], [place], PA-C 06/29/18 1049    Carmin Muskrat, MD 06/29/18 1555

## 2018-06-29 NOTE — ED Triage Notes (Signed)
Pt has been experiencing vaginal itching, discharge and fishy odor since last Monday.  She was seen at urgent care last Thursday where they treated her for yeast infection.  No pelvic exam done at that time.  Pt continues to have all the symptoms except the odor.  Pt is also having urinary frequency and burning.

## 2018-06-29 NOTE — Discharge Instructions (Signed)
You were seen in the emergency department today for vaginal irritation.  Your wet prep shows a yeast infection.  We are treating this with Diflucan, take 1 pill today and repeat in 72 hours if this is not improved.  Follow-up with women's within 1 week.  Return to the ER for new or worsening symptoms or any other concerns.

## 2018-06-30 LAB — GC/CHLAMYDIA PROBE AMP (~~LOC~~) NOT AT ARMC
Chlamydia: NEGATIVE
Neisseria Gonorrhea: NEGATIVE

## 2018-06-30 LAB — RPR: RPR Ser Ql: NONREACTIVE

## 2018-06-30 LAB — HIV ANTIBODY (ROUTINE TESTING W REFLEX): HIV Screen 4th Generation wRfx: NONREACTIVE

## 2018-06-30 LAB — URINE CULTURE: Culture: <10000 — AB

## 2018-07-19 ENCOUNTER — Encounter (HOSPITAL_COMMUNITY): Payer: Self-pay

## 2018-07-19 ENCOUNTER — Other Ambulatory Visit: Payer: Self-pay

## 2018-07-19 ENCOUNTER — Emergency Department (HOSPITAL_COMMUNITY)
Admission: EM | Admit: 2018-07-19 | Discharge: 2018-07-19 | Disposition: A | Discharge status: Home / Self Care | Payer: 59 | Attending: Emergency Medicine | Admitting: Emergency Medicine

## 2018-07-19 DIAGNOSIS — R1032 Left lower quadrant pain: Secondary | ICD-10-CM | POA: Diagnosis not present

## 2018-07-19 DIAGNOSIS — F17210 Nicotine dependence, cigarettes, uncomplicated: Secondary | ICD-10-CM | POA: Insufficient documentation

## 2018-07-19 DIAGNOSIS — I10 Essential (primary) hypertension: Secondary | ICD-10-CM | POA: Insufficient documentation

## 2018-07-19 DIAGNOSIS — B373 Candidiasis of vulva and vagina: Secondary | ICD-10-CM | POA: Diagnosis not present

## 2018-07-19 DIAGNOSIS — B379 Candidiasis, unspecified: Secondary | ICD-10-CM

## 2018-07-19 LAB — CBC WITH DIFFERENTIAL/PLATELET
Abs Immature Granulocytes: 0.03 10*3/uL (ref 0.00–0.07)
Basophils Absolute: 0 10*3/uL (ref 0.0–0.1)
Basophils Relative: 0 %
Eosinophils Absolute: 0.1 10*3/uL (ref 0.0–0.5)
Eosinophils Relative: 1 %
HCT: 43 % (ref 36.0–46.0)
Hemoglobin: 14 g/dL (ref 12.0–15.0)
Immature Granulocytes: 0 %
Lymphocytes Relative: 33 %
Lymphs Abs: 3.8 10*3/uL (ref 0.7–4.0)
MCH: 29.7 pg (ref 26.0–34.0)
MCHC: 32.6 g/dL (ref 30.0–36.0)
MCV: 91.3 fL (ref 80.0–100.0)
Monocytes Absolute: 0.7 10*3/uL (ref 0.1–1.0)
Monocytes Relative: 6 %
Neutro Abs: 7 10*3/uL (ref 1.7–7.7)
Neutrophils Relative %: 60 %
Platelets: 323 10*3/uL (ref 150–400)
RBC: 4.71 MIL/uL (ref 3.87–5.11)
RDW: 13.5 % (ref 11.5–15.5)
WBC: 11.7 10*3/uL — ABNORMAL HIGH (ref 4.0–10.5)
nRBC: 0 % (ref 0.0–0.2)

## 2018-07-19 LAB — COMPREHENSIVE METABOLIC PANEL
ALT: 23 U/L (ref 0–44)
AST: 18 U/L (ref 15–41)
Albumin: 4.2 g/dL (ref 3.5–5.0)
Alkaline Phosphatase: 58 U/L (ref 38–126)
Anion gap: 11 (ref 5–15)
BUN: 12 mg/dL (ref 6–20)
CO2: 22 mmol/L (ref 22–32)
Calcium: 9.2 mg/dL (ref 8.9–10.3)
Chloride: 107 mmol/L (ref 98–111)
Creatinine, Ser: 0.71 mg/dL (ref 0.44–1.00)
GFR calc Af Amer: >60 mL/min (ref >60)
GFR calc non Af Amer: >60 mL/min (ref >60)
Glucose, Bld: 82 mg/dL (ref 70–99)
Potassium: 3.7 mmol/L (ref 3.5–5.1)
Sodium: 140 mmol/L (ref 135–145)
Total Bilirubin: 0.2 mg/dL — ABNORMAL LOW (ref 0.3–1.2)
Total Protein: 7.7 g/dL (ref 6.5–8.1)

## 2018-07-19 LAB — WET PREP, GENITAL
Clue Cells Wet Prep HPF POC: NONE SEEN
Sperm: NONE SEEN
Trich, Wet Prep: NONE SEEN
Yeast Wet Prep HPF POC: NONE SEEN

## 2018-07-19 LAB — URINALYSIS, ROUTINE W REFLEX MICROSCOPIC
Bilirubin Urine: NEGATIVE
Glucose, UA: NEGATIVE mg/dL
Hgb urine dipstick: NEGATIVE
Ketones, ur: NEGATIVE mg/dL
Nitrite: NEGATIVE
Protein, ur: NEGATIVE mg/dL
Specific Gravity, Urine: 1.02 (ref 1.005–1.030)
pH: 6 (ref 5.0–8.0)

## 2018-07-19 LAB — LIPASE, BLOOD: Lipase: 23 U/L (ref 11–51)

## 2018-07-19 LAB — PREGNANCY, URINE: Preg Test, Ur: NEGATIVE

## 2018-07-19 MED ORDER — FLUCONAZOLE 150 MG PO TABS: ORAL_TABLET | ORAL | 0 refills | Status: DC

## 2018-07-19 NOTE — Discharge Instructions (Signed)
You were seen in the ED today for abdominal pain and vaginal discharge; your labwork was reassuring; we are treating you for a yeast infection today given the appearance of your vaginal discharge. Please take the medication as prescribed. Please follow up with the OBGYN listed below; they have walk in hours; you will need to call and see when their walk in hours are.

## 2018-07-19 NOTE — ED Notes (Signed)
ED PA at bedside

## 2018-07-19 NOTE — ED Provider Notes (Signed)
[facility] DEPT Provider Note   CSN: [id number] Arrival date & time: 07/19/18  1550    History   Chief Complaint Chief Complaint  Patient presents with  . Abdominal Pain    HPI Jessica Mcpherson is a 33 y.o. female with PMHx HTN who presents to the ED today complaining of LLQ abdominal pain, vaginal discharge, and vaginal itching x 2 days. Pt reports that she has been dealing with similar vaginal discharge intermittently since April 2020 after she was diagnosed with trich. She reports that she was treated for a yeast infection less than 3 weeks ago at [facility] ED; her vaginal discharge resolved at that point in time but returned 2 days ago. She denies having intercourse since being treated 3 weeks ago; pt is in a monogamous relationship with 1 female partner without the use of protection. LNMP 3 weeks ago. Denies fever, chills, nausea, vomiting, diarrhea, constipation, hematochezia, melena, pelvic pain, dysuria, frequency.        Past Medical History:  Diagnosis Date  . Hypertension   . Miscarriage     Patient Active Problem List   Diagnosis Date Noted  . Acute appendicitis 12/30/2013    Past Surgical History:  Procedure Laterality Date  . APPENDECTOMY    . CESAREAN SECTION    . LAPAROSCOPIC APPENDECTOMY N/A 12/30/2013   Procedure: APPENDECTOMY LAPAROSCOPIC;  Surgeon: Jackolyn Confer, MD;  Location: [facility];  Service: General;  Laterality: N/A;     OB History    Gravida  5   Para  1   Term  1   Preterm      AB  2   Living  1     SAB  1   TAB      Ectopic      Multiple      Live Births  1            Home Medications    Prior to Admission medications   Medication Sig Start Date End Date Taking? Authorizing Provider  fluconazole (DIFLUCAN) 150 MG tablet Take 1 table today, repeat in 72 hours if symptoms persist 07/19/18   Alroy Bailiff, Cejay Cambre, PA-C  fluticasone (FLONASE) 50 MCG/ACT nasal spray Place 2 sprays into both  nostrils daily. Patient not taking: Reported on 07/19/2018 01/24/17   Street, [place], PA-C    Family History Family History  Problem Relation Age of Onset  . Diabetes Mother   . Hypertension Mother   . Diabetes Maternal Grandmother   . Hypertension Maternal Grandmother   . Diabetes Maternal Grandfather   . Hypertension Father     Social History Social History   Tobacco Use  . Smoking status: Current Every Day Smoker    Packs/day: 0.10    Types: Cigarettes  . Smokeless tobacco: Never Used  Substance Use Topics  . Alcohol use: No    Alcohol/week: 0.0 standard drinks  . Drug use: No     Allergies   Penicillins and Pepperoni [pickled meat]   Review of Systems Review of Systems  Constitutional: Negative for chills and fever.  HENT: Negative for congestion.   Eyes: Negative for visual disturbance.  Respiratory: Negative for cough and shortness of breath.   Cardiovascular: Negative for chest pain.  Gastrointestinal: Positive for abdominal pain. Negative for blood in stool, constipation, diarrhea, nausea and vomiting.  Genitourinary: Positive for vaginal discharge. Negative for decreased urine volume, difficulty urinating, dysuria, flank pain, frequency, menstrual problem, vaginal bleeding and vaginal pain.  Musculoskeletal:  Negative for myalgias.  Skin: Negative for rash.  Neurological: Negative for headaches.     Physical Exam Updated Vital Signs BP 134/90 (BP Location: Right Arm)   Pulse 89   Temp 98.8 F (37.1 C) (Oral)   Resp 15   Ht 5' (1.524 m)   Wt 73.5 kg   LMP 06/30/2018   SpO2 98%   BMI 31.64 kg/m   Physical Exam Vitals signs and nursing note reviewed. Exam conducted with a chaperone present.  Constitutional:      Appearance: She is not ill-appearing.  HENT:     Head: Normocephalic and atraumatic.  Eyes:     Conjunctiva/sclera: Conjunctivae normal.  Neck:     Musculoskeletal: Neck supple.  Cardiovascular:     Rate and Rhythm: Normal rate and  regular rhythm.  Pulmonary:     Effort: Pulmonary effort is normal.     Breath sounds: Normal breath sounds. No wheezing, rhonchi or rales.  Abdominal:     Palpations: Abdomen is soft.     Tenderness: There is no abdominal tenderness. There is no right CVA tenderness, left CVA tenderness, guarding or rebound.  Genitourinary:    Comments: [place] Nursing Assistant present for exam. No rashes, lesions, or tenderness to external genitalia. No erythema, injury, or tenderness to vaginal mucosa. Thick white discharge to vaginal vault. No adnexal masses, tenderness, or fullness. No CMT, cervical friability, or discharge from cervical os. Cervical os is closed. Uterus non-deviated, mobile, nonTTP, and without enlargement.   Skin:    General: Skin is warm and dry.  Neurological:     Mental Status: She is alert.      ED Treatments / Results  Labs (all labs ordered are listed, but only abnormal results are displayed) Labs Reviewed  WET PREP, GENITAL - Abnormal; Notable for the following components:      Result Value   WBC, Wet Prep HPF POC MANY (*)    All other components within normal limits  COMPREHENSIVE METABOLIC PANEL - Abnormal; Notable for the following components:   Total Bilirubin 0.2 (*)    All other components within normal limits  CBC WITH DIFFERENTIAL/PLATELET - Abnormal; Notable for the following components:   WBC 11.7 (*)    All other components within normal limits  URINALYSIS, ROUTINE W REFLEX MICROSCOPIC - Abnormal; Notable for the following components:   APPearance HAZY (*)    Leukocytes,Ua LARGE (*)    Bacteria, UA RARE (*)    All other components within normal limits  LIPASE, BLOOD  PREGNANCY, URINE  RPR  HIV ANTIBODY (ROUTINE TESTING W REFLEX)  GC/CHLAMYDIA PROBE AMP ([place]) NOT AT [facility]    EKG None  Radiology No results found.  Procedures Procedures (including critical care time)  Medications Ordered in ED Medications - No data  to display   Initial Impression / Assessment and Plan / ED Course  I have reviewed the triage vital signs and the nursing notes.  Pertinent labs & imaging results that were available during my care of the patient were reviewed by me and considered in my medical decision making (see chart for details).    33 year old female presenting to the ED today for vaginal discharge, vaginal itching, and LLQ abdominal pain. Pt mildly tender on exam to LLQ without peritoneal signs. PSHx includes appendectomy. Low suspicion for diverticulitis given no complaints of nausea, vomiting, fever. Will obtain baseline bloodwork regardless. Will also perform pelvic exam today and obtain U/A and urine preg.  Pt without complaints of pelvic pain; low suspicion for TOA vs PID  But will reevaluate once pelvic exam is performed; if any adnexal or CMT tenderness will order pelvic ultrasound.   Pelvic exam with thick white vaginal discharge; suspect yeast infection at this time. Will await wet prep results. No adnexal or CMT tenderness on exam; do not feel patient needs pelvic ultrasound at this time.   Wet prep negative at this time for yeast, trich, and BV. Suspicious for yeast infection at this time as pt had thick white discharge on exam consistent with yeast. Will treat for this today. Remainder of bloodwork reassuring. Mildly elevated leukocytosis at 11.7; pt afebrile today without complaints of fevers at home. Non tachy, nontachypneic. Feel this may be elevated due to pain. Do not feel patient needs additional workup at this time including CT A/P; she is nontender on exam to abdomen. Will discharge home with Rx diflucan as well as OBGYN resource to follow up. Pt is in agreement with plan at this time and stable for discharge home.        Final Clinical Impressions(s) / ED Diagnoses   Final diagnoses:  Left lower quadrant abdominal pain  Yeast infection    ED Discharge Orders         Ordered    fluconazole  (DIFLUCAN) 150 MG tablet     07/19/18 2247           Eustaquio Maize, PA-C 07/20/18 0024    Maudie Flakes, MD 07/26/18 [phone]

## 2018-07-19 NOTE — ED Triage Notes (Signed)
patient c/o LLQ pain that started today. Patient states "I have been dealing with a yeast infection and today my abdomen is hurting now on the left." patient denies any N/v/D

## 2018-07-20 ENCOUNTER — Emergency Department (HOSPITAL_COMMUNITY): Admission: EM | Admit: 2018-07-20 | Discharge: 2018-07-20 | Discharge status: Absent without leave | Payer: 59

## 2018-07-20 LAB — RPR: RPR Ser Ql: NONREACTIVE

## 2018-07-20 LAB — HIV ANTIBODY (ROUTINE TESTING W REFLEX): HIV Screen 4th Generation wRfx: NONREACTIVE

## 2018-07-21 LAB — GC/CHLAMYDIA PROBE AMP (~~LOC~~) NOT AT ARMC
Chlamydia: NEGATIVE
Neisseria Gonorrhea: NEGATIVE

## 2018-09-30 ENCOUNTER — Other Ambulatory Visit: Payer: Self-pay

## 2018-09-30 DIAGNOSIS — Z20822 Contact with and (suspected) exposure to covid-19: Secondary | ICD-10-CM

## 2018-10-02 LAB — NOVEL CORONAVIRUS, NAA: SARS-CoV-2, NAA: NOT DETECTED

## 2018-10-12 ENCOUNTER — Emergency Department
Admission: EM | Admit: 2018-10-12 | Discharge: 2018-10-12 | Disposition: A | Discharge status: Home / Self Care | Payer: 59 | Attending: Student in an Organized Health Care Education/Training Program | Admitting: Student in an Organized Health Care Education/Training Program

## 2018-10-12 ENCOUNTER — Other Ambulatory Visit: Payer: Self-pay

## 2018-10-12 DIAGNOSIS — R112 Nausea with vomiting, unspecified: Secondary | ICD-10-CM | POA: Insufficient documentation

## 2018-10-12 DIAGNOSIS — R197 Diarrhea, unspecified: Secondary | ICD-10-CM | POA: Insufficient documentation

## 2018-10-12 DIAGNOSIS — I10 Essential (primary) hypertension: Secondary | ICD-10-CM | POA: Insufficient documentation

## 2018-10-12 DIAGNOSIS — R109 Unspecified abdominal pain: Secondary | ICD-10-CM | POA: Diagnosis present

## 2018-10-12 DIAGNOSIS — F17210 Nicotine dependence, cigarettes, uncomplicated: Secondary | ICD-10-CM | POA: Diagnosis not present

## 2018-10-12 LAB — COMPREHENSIVE METABOLIC PANEL
ALT: 22 U/L (ref 0–44)
AST: 22 U/L (ref 15–41)
Albumin: 4.1 g/dL (ref 3.5–5.0)
Alkaline Phosphatase: 56 U/L (ref 38–126)
Anion gap: 10 (ref 5–15)
BUN: 11 mg/dL (ref 6–20)
CO2: 20 mmol/L — ABNORMAL LOW (ref 22–32)
Calcium: 9 mg/dL (ref 8.9–10.3)
Chloride: 105 mmol/L (ref 98–111)
Creatinine, Ser: 0.7 mg/dL (ref 0.44–1.00)
GFR calc Af Amer: >60 mL/min (ref >60)
GFR calc non Af Amer: >60 mL/min (ref >60)
Glucose, Bld: 87 mg/dL (ref 70–99)
Potassium: 4.1 mmol/L (ref 3.5–5.1)
Sodium: 135 mmol/L (ref 135–145)
Total Bilirubin: 0.5 mg/dL (ref 0.3–1.2)
Total Protein: 7.7 g/dL (ref 6.5–8.1)

## 2018-10-12 LAB — URINALYSIS, COMPLETE (UACMP) WITH MICROSCOPIC
Bacteria, UA: NONE SEEN
Bilirubin Urine: NEGATIVE
Glucose, UA: NEGATIVE mg/dL
Ketones, ur: NEGATIVE mg/dL
Leukocytes,Ua: NEGATIVE
Nitrite: NEGATIVE
Protein, ur: NEGATIVE mg/dL
Specific Gravity, Urine: 1.012 (ref 1.005–1.030)
pH: 8 (ref 5.0–8.0)

## 2018-10-12 LAB — CBC
HCT: 44.1 % (ref 36.0–46.0)
Hemoglobin: 14.5 g/dL (ref 12.0–15.0)
MCH: 29.2 pg (ref 26.0–34.0)
MCHC: 32.9 g/dL (ref 30.0–36.0)
MCV: 88.7 fL (ref 80.0–100.0)
Platelets: 311 10*3/uL (ref 150–400)
RBC: 4.97 MIL/uL (ref 3.87–5.11)
RDW: 12.9 % (ref 11.5–15.5)
WBC: 10.7 10*3/uL — ABNORMAL HIGH (ref 4.0–10.5)
nRBC: 0 % (ref 0.0–0.2)

## 2018-10-12 LAB — POCT PREGNANCY, URINE: Preg Test, Ur: NEGATIVE

## 2018-10-12 LAB — LIPASE, BLOOD: Lipase: 21 U/L (ref 11–51)

## 2018-10-12 MED ORDER — ONDANSETRON 4 MG PO TBDP
4.0000 mg | ORAL_TABLET | Freq: Once | ORAL | Status: AC
  Administered 2018-10-12: 16:00:00 4 mg via ORAL
  Filled 2018-10-12: qty 1

## 2018-10-12 MED ORDER — ONDANSETRON HCL 4 MG PO TABS: 4.0000 mg | ORAL_TABLET | Freq: Every day | ORAL | 0 refills | Status: DC | PRN

## 2018-10-12 NOTE — Discharge Instructions (Signed)

## 2018-10-12 NOTE — ED Provider Notes (Signed)
[facility] Main Emergency Department Provider Note    First MD Initiated Contact with Patient 10/12/18 1515     (approximate)  I have reviewed the triage vital signs and the nursing notes.   HISTORY  Chief Complaint Abdominal Pain and Diarrhea    HPI Jessica Mcpherson is a 33 y.o. female the below listed past medical history presents the ER for headache nausea vomiting diarrhea illness for the past 4 days.  States she was recently tested for COVID has been negative.  No other recent sick contacts.  Was having fevers of the past 2 days is not having fever today.  Is been tolerating p.o. today.  Not having blood in her stool.  No dysuria.  Is having intermittent crampy abdominal pain diffusely.  No localized pain.    Past Medical History:  Diagnosis Date  . Hypertension   . Miscarriage    Family History  Problem Relation Age of Onset  . Diabetes Mother   . Hypertension Mother   . Diabetes Maternal Grandmother   . Hypertension Maternal Grandmother   . Diabetes Maternal Grandfather   . Hypertension Father    Past Surgical History:  Procedure Laterality Date  . APPENDECTOMY    . CESAREAN SECTION    . LAPAROSCOPIC APPENDECTOMY N/A 12/30/2013   Procedure: APPENDECTOMY LAPAROSCOPIC;  Surgeon: Jackolyn Confer, MD;  Location: [facility];  Service: General;  Laterality: N/A;   Patient Active Problem List   Diagnosis Date Noted  . Acute appendicitis 12/30/2013      Prior to Admission medications   Medication Sig Start Date End Date Taking? Authorizing Provider  fluconazole (DIFLUCAN) 150 MG tablet Take 1 table today, repeat in 72 hours if symptoms persist 07/19/18   Alroy Bailiff, [place], PA-C  fluticasone (FLONASE) 50 MCG/ACT nasal spray Place 2 sprays into both nostrils daily. Patient not taking: Reported on 07/19/2018 01/24/17   Street, [place], PA-C  ondansetron (ZOFRAN) 4 MG tablet Take 1 tablet (4 mg total) by mouth daily as needed. 10/12/18 10/12/19  Merlyn Lot, MD    Allergies Penicillins and Pepperoni [pickled meat]    Social History Social History   Tobacco Use  . Smoking status: Current Every Day Smoker    Packs/day: 0.10    Types: Cigarettes  . Smokeless tobacco: Never Used  Substance Use Topics  . Alcohol use: No    Alcohol/week: 0.0 standard drinks  . Drug use: No    Review of Systems Patient denies headaches, rhinorrhea, blurry vision, numbness, shortness of breath, chest pain, edema, cough, abdominal pain, nausea, vomiting, diarrhea, dysuria, fevers, rashes or hallucinations unless otherwise stated above in HPI. ____________________________________________   PHYSICAL EXAM:  VITAL SIGNS: Vitals:   10/12/18 1223 10/12/18 1515  BP: (!) 136/92 (!) 135/95  Pulse: 71 75  Resp: 16 18  Temp: 98.7 F (37.1 C)   SpO2: 100% 100%    Constitutional: Alert and oriented.  Eyes: Conjunctivae are normal.  Head: Atraumatic. Nose: No congestion/rhinnorhea. Mouth/Throat: Mucous membranes are moist.   Neck: No stridor. Painless ROM.  Cardiovascular: Normal rate, regular rhythm. Grossly normal heart sounds.  Good peripheral circulation. Respiratory: Normal respiratory effort.  No retractions. Lungs CTAB. Gastrointestinal: Soft and nontender in all four quadrants. No distention. No abdominal bruits. No CVA tenderness. Genitourinary:  Musculoskeletal: No lower extremity tenderness nor edema.  No joint effusions. Neurologic:  Normal speech and language. No gross focal neurologic deficits are appreciated. No facial droop Skin:  Skin is warm, dry and intact.  No rash noted. Psychiatric: Mood and affect are normal. Speech and behavior are normal.  ____________________________________________   LABS (all labs ordered are listed, but only abnormal results are displayed)  Results for orders placed or performed during the hospital encounter of 10/12/18 (from the past 24 hour(s))  Lipase, blood     Status: None   Collection  Time: 10/12/18 12:27 PM  Result Value Ref Range   Lipase 21 11 - 51 U/L  Comprehensive metabolic panel     Status: Abnormal   Collection Time: 10/12/18 12:27 PM  Result Value Ref Range   Sodium 135 135 - 145 mmol/L   Potassium 4.1 3.5 - 5.1 mmol/L   Chloride 105 98 - 111 mmol/L   CO2 20 (L) 22 - 32 mmol/L   Glucose, Bld 87 70 - 99 mg/dL   BUN 11 6 - 20 mg/dL   Creatinine, Ser 0.70 0.44 - 1.00 mg/dL   Calcium 9.0 8.9 - 10.3 mg/dL   Total Protein 7.7 6.5 - 8.1 g/dL   Albumin 4.1 3.5 - 5.0 g/dL   AST 22 15 - 41 U/L   ALT 22 0 - 44 U/L   Alkaline Phosphatase 56 38 - 126 U/L   Total Bilirubin 0.5 0.3 - 1.2 mg/dL   GFR calc non Af Amer >60 >60 mL/min   GFR calc Af Amer >60 >60 mL/min   Anion gap 10 5 - 15  CBC     Status: Abnormal   Collection Time: 10/12/18 12:27 PM  Result Value Ref Range   WBC 10.7 (H) 4.0 - 10.5 K/uL   RBC 4.97 3.87 - 5.11 MIL/uL   Hemoglobin 14.5 12.0 - 15.0 g/dL   HCT 44.1 36.0 - 46.0 %   MCV 88.7 80.0 - 100.0 fL   MCH 29.2 26.0 - 34.0 pg   MCHC 32.9 30.0 - 36.0 g/dL   RDW 12.9 11.5 - 15.5 %   Platelets 311 150 - 400 K/uL   nRBC 0.0 0.0 - 0.2 %  Urinalysis, Complete w Microscopic     Status: Abnormal   Collection Time: 10/12/18 12:27 PM  Result Value Ref Range   Color, Urine YELLOW (A) YELLOW   APPearance CLEAR (A) CLEAR   Specific Gravity, Urine 1.012 1.005 - 1.030   pH 8.0 5.0 - 8.0   Glucose, UA NEGATIVE NEGATIVE mg/dL   Hgb urine dipstick LARGE (A) NEGATIVE   Bilirubin Urine NEGATIVE NEGATIVE   Ketones, ur NEGATIVE NEGATIVE mg/dL   Protein, ur NEGATIVE NEGATIVE mg/dL   Nitrite NEGATIVE NEGATIVE   Leukocytes,Ua NEGATIVE NEGATIVE   RBC / HPF 0-5 0 - 5 RBC/hpf   WBC, UA 0-5 0 - 5 WBC/hpf   Bacteria, UA NONE SEEN NONE SEEN   Squamous Epithelial / LPF 6-10 0 - 5  Pregnancy, urine POC     Status: None   Collection Time: 10/12/18 12:35 PM  Result Value Ref Range   Preg Test, Ur NEGATIVE NEGATIVE   ____________________________________________   ____________________________________________  RADIOLOGY   ____________________________________________   PROCEDURES  Procedure(s) performed:  Procedures    Critical Care performed: no ____________________________________________   INITIAL IMPRESSION / ASSESSMENT AND PLAN / ED COURSE  Pertinent labs & imaging results that were available during my care of the patient were reviewed by me and considered in my medical decision making (see chart for details).   DDX: Enteritis, gastritis, 23, viral illness, cholecystitis, pancreatitis, appendicitis, dehydration  Jessica Mcpherson is a 33 y.o. who presents to  the ED with symptoms as described above.  Patient well-appearing afebrile hemodynamically stable.  Abdominal exam currently soft benign.  Does have mildly elevated leukocytosis which would be expected in the setting of nausea and vomiting.  No bloody diarrhea.  Patient tolerating p.o.  Given her reassuring exam at this point I do not feel that advanced imaging clinically indicated with this presentation.  Discussed enema emetics conservative management and signs and symptoms for which she should return to the ER.  Have discussed with the patient and available family all diagnostics and treatments performed thus far and all questions were answered to the best of my ability. The patient demonstrates understanding and agreement with plan.      The patient was evaluated in Emergency Department today for the symptoms described in the history of present illness. He/she was evaluated in the context of the global COVID-19 pandemic, which necessitated consideration that the patient might be at risk for infection with the SARS-CoV-2 virus that causes COVID-19. Institutional protocols and algorithms that pertain to the evaluation of patients at risk for COVID-19 are in a state of rapid change based on information released by regulatory bodies including the CDC and federal and state organizations. These  policies and algorithms were followed during the patient's care in the ED.  As part of my medical decision making, I reviewed the following data within the [place] notes reviewed and incorporated, Labs reviewed, notes from prior ED visits and [place] Controlled Substance Database   ____________________________________________   FINAL CLINICAL IMPRESSION(S) / ED DIAGNOSES  Final diagnoses:  Nausea vomiting and diarrhea      NEW MEDICATIONS STARTED DURING THIS VISIT:  New Prescriptions   ONDANSETRON (ZOFRAN) 4 MG TABLET    Take 1 tablet (4 mg total) by mouth daily as needed.     Note:  This document was prepared using Dragon voice recognition software and may include unintentional dictation errors.    Merlyn Lot, MD 10/12/18 [id number]

## 2018-10-12 NOTE — ED Triage Notes (Signed)
Reports generalized stomach cramping and diarrhea today. States that on Sunday she had abdominal pain, emesis and diarrhea, subjective fever. Pt alert and oriented X4, cooperative, RR even and unlabored, color WNL. Pt in NAD.

## 2018-11-05 ENCOUNTER — Other Ambulatory Visit: Payer: Self-pay

## 2018-11-05 ENCOUNTER — Encounter (HOSPITAL_COMMUNITY): Payer: Self-pay

## 2018-11-05 ENCOUNTER — Emergency Department (HOSPITAL_COMMUNITY)
Admission: EM | Admit: 2018-11-05 | Discharge: 2018-11-05 | Disposition: A | Discharge status: Home / Self Care | Payer: 59 | Attending: Emergency Medicine | Admitting: Emergency Medicine

## 2018-11-05 DIAGNOSIS — I10 Essential (primary) hypertension: Secondary | ICD-10-CM | POA: Insufficient documentation

## 2018-11-05 DIAGNOSIS — F17210 Nicotine dependence, cigarettes, uncomplicated: Secondary | ICD-10-CM | POA: Diagnosis not present

## 2018-11-05 DIAGNOSIS — B349 Viral infection, unspecified: Secondary | ICD-10-CM | POA: Insufficient documentation

## 2018-11-05 DIAGNOSIS — R509 Fever, unspecified: Secondary | ICD-10-CM | POA: Diagnosis present

## 2018-11-05 LAB — I-STAT BETA HCG BLOOD, ED (MC, WL, AP ONLY): I-stat hCG, quantitative: <5 m[IU]/mL (ref <5)

## 2018-11-05 NOTE — ED Provider Notes (Signed)
[facility] DEPT Provider Note   CSN: [id number] Arrival date & time: 11/05/18  1240     History   Chief Complaint Chief Complaint  Patient presents with  . Fever  . Generalized Body Aches    HPI Jessica Mcpherson is a 33 y.o. female.     HPI  Patient is a 33 year old female with a history of hypertension and allergies patient presented today with cough, congestion, sore throat subjective chills and fever of 101 this morning.  States that all his symptoms were gradual in onset and have been constant since.  States sore throat is achy and dull in nature worse with swallowing, better with hot tea.  Some relief with cough drops.  Patient states she went to work this morning although she felt tired and had some mild nausea and generalized abdominal discomfort and had one episode of diarrhea.  Patient left work to present to ED.  States that she is concerned that she is pregnant and requested pregnancy test.  Patient states she recently was tested at work for [employer] and was negative.  Does not wish to have repeat testing today.  Denies vomiting, shortness of breath, chest pain, leg swelling, difficulty breathing, headache.  Past Medical History:  Diagnosis Date  . Hypertension   . Miscarriage     Patient Active Problem List   Diagnosis Date Noted  . Acute appendicitis 12/30/2013    Past Surgical History:  Procedure Laterality Date  . APPENDECTOMY    . CESAREAN SECTION    . LAPAROSCOPIC APPENDECTOMY N/A 12/30/2013   Procedure: APPENDECTOMY LAPAROSCOPIC;  Surgeon: Jackolyn Confer, MD;  Location: [facility];  Service: General;  Laterality: N/A;     OB History    Gravida  5   Para  1   Term  1   Preterm      AB  2   Living  1     SAB  1   TAB      Ectopic      Multiple      Live Births  1            Home Medications    Prior to Admission medications   Medication Sig Start Date End Date Taking? Authorizing Provider   fluconazole (DIFLUCAN) 150 MG tablet Take 1 table today, repeat in 72 hours if symptoms persist 07/19/18   Alroy Bailiff, [place], PA-C  fluticasone (FLONASE) 50 MCG/ACT nasal spray Place 2 sprays into both nostrils daily. Patient not taking: Reported on 07/19/2018 01/24/17   Street, [place], PA-C  ondansetron (ZOFRAN) 4 MG tablet Take 1 tablet (4 mg total) by mouth daily as needed. 10/12/18 10/12/19  Merlyn Lot, MD    Family History Family History  Problem Relation Age of Onset  . Diabetes Mother   . Hypertension Mother   . Diabetes Maternal Grandmother   . Hypertension Maternal Grandmother   . Diabetes Maternal Grandfather   . Hypertension Father     Social History Social History   Tobacco Use  . Smoking status: Current Every Day Smoker    Packs/day: 0.10    Types: Cigarettes  . Smokeless tobacco: Never Used  Substance Use Topics  . Alcohol use: No    Alcohol/week: 0.0 standard drinks  . Drug use: No     Allergies   Penicillins and Pepperoni [pickled meat]   Review of Systems Review of Systems  Constitutional: Positive for fever.  HENT: Positive for congestion, postnasal drip and sore throat.  Respiratory: Positive for cough. Negative for shortness of breath.   Cardiovascular: Negative for chest pain and leg swelling.  Gastrointestinal: Positive for abdominal pain, diarrhea and nausea. Negative for vomiting.  Genitourinary: Negative for dysuria.  Musculoskeletal: Negative for myalgias.  Skin: Negative for rash.  Neurological: Negative for dizziness and headaches.     Physical Exam Updated Vital Signs BP (!) 137/96   Pulse 76   Temp 98.8 F (37.1 C) (Oral)   Resp 18   Ht 5' (1.524 m)   Wt 73 kg   LMP 10/04/2018   SpO2 100%   BMI 31.43 kg/m   Physical Exam Vitals signs and nursing note reviewed.  Constitutional:      General: She is not in acute distress. HENT:     Head: Normocephalic and atraumatic.     Nose: Nose normal.     Mouth/Throat:      Mouth: Mucous membranes are moist.     Pharynx: No oropharyngeal exudate or posterior oropharyngeal erythema.     Comments: Posterior pharynx cobblestoning without erythema mild PND present Eyes:     General: No scleral icterus. Neck:     Musculoskeletal: Normal range of motion.  Cardiovascular:     Rate and Rhythm: Normal rate and regular rhythm.     Pulses: Normal pulses.     Heart sounds: Normal heart sounds.  Pulmonary:     Effort: Pulmonary effort is normal. No respiratory distress.     Breath sounds: No wheezing.  Abdominal:     Palpations: Abdomen is soft.     Tenderness: There is no abdominal tenderness.     Comments: No abdominal tenderness to palpation, bowel sounds present, no CVA tenderness  Musculoskeletal:     Right lower leg: No edema.     Left lower leg: No edema.  Skin:    General: Skin is warm and dry.     Capillary Refill: Capillary refill takes less than 2 seconds.  Neurological:     Mental Status: She is alert. Mental status is at baseline.  Psychiatric:        Mood and Affect: Mood normal.        Behavior: Behavior normal.      ED Treatments / Results  Labs (all labs ordered are listed, but only abnormal results are displayed) Labs Reviewed  I-STAT BETA HCG BLOOD, ED ([facility], [facility], [facility] ONLY)  I-STAT BETA HCG BLOOD, ED ([facility], [facility], [facility] ONLY)    EKG None  Radiology No results found.  Procedures Procedures (including critical care time)  Medications Ordered in ED Medications - No data to display   Initial Impression / Assessment and Plan / ED Course  I have reviewed the triage vital signs and the nursing notes.  Pertinent labs & imaging results that were available during my care of the patient were reviewed by me and considered in my medical decision making (see chart for details).        Patient is well-appearing 33 year old with cough, congestion, diarrhea and abdominal discomfort.  Patient is well-appearing and has benign physical exam with  mild cobblestoning of her posterior pharynx patient likely has allergies or viral URI.  Discussed conservative treatment.  Patient has vitals within normal notes other than mildly elevated blood pressure which was discussed.  Patient will follow up with PCP Sherren Mocha for reevaluation.  Doubt strep throat as patient has no tonsillar exudates, enlargement, cough present and is greater than 93 years old with symptoms consistent with a URI making  her low likelihood for strep per [facility] criteria.   Doubt PE, ACS or other concerning etiology of symptoms.  Patient also requested hCG blood test specifically she states that she is concerned she is pregnant and is concerned that urine test is not sensitive enough. HCG negative.  Patient has vitals within normal limits     Final Clinical Impressions(s) / ED Diagnoses   Final diagnoses:  Viral illness    ED Discharge Orders    None       Tedd Sias, [place] 11/05/18 1536    Dorie Rank, MD 11/07/18 1147

## 2018-11-05 NOTE — ED Triage Notes (Addendum)
Pt states that she woke up today with a fever of 101. Pt states she took tylenol. Pt states that she has a sore throat as well. Pt states she tried to go to work, but had some nausea and abd discomfort. Pt is also unsure if she is pregnant or not and would like pregnancy test.

## 2018-11-05 NOTE — Progress Notes (Addendum)
TOC chart review: 4 ED visits in six months  Currently pt does have PCP and insurance. Pt will continue to follow up with PCP. Pt states she has appt in November. Pt states she will call to arrange appt with PCP. [place], [facility] TOC CM [phone]

## 2018-11-05 NOTE — Discharge Instructions (Addendum)
Your hCG-pregnancy test was negative today.  1. Medications: continue home medications 2. Treatment: Continue to stay well-hydrated. Gargle warm salt water and spit it out for sore throat. Take benadryl to decrease secretions. Continue to alternate between Tylenol and ibuprofen for pain and fever control. Use your usual allergy medications including zyrtec and flonase which you have at home.  3. Follow Up: Follow up with your primary care doctor in 5-7 days for recheck of ongoing symptoms.  Return to emergency department for emergent changing or worsening of symptoms.

## 2019-01-19 DIAGNOSIS — M65811 Other synovitis and tenosynovitis, right shoulder: Secondary | ICD-10-CM | POA: Insufficient documentation

## 2019-01-19 DIAGNOSIS — M7581 Other shoulder lesions, right shoulder: Secondary | ICD-10-CM | POA: Insufficient documentation

## 2019-01-19 DIAGNOSIS — M65911 Unspecified synovitis and tenosynovitis, right shoulder: Secondary | ICD-10-CM | POA: Insufficient documentation

## 2019-05-03 ENCOUNTER — Other Ambulatory Visit: Payer: Self-pay

## 2019-05-03 ENCOUNTER — Ambulatory Visit: Payer: 59 | Admitting: Internal Medicine

## 2019-05-03 ENCOUNTER — Encounter: Payer: Self-pay | Admitting: Internal Medicine

## 2019-05-03 ENCOUNTER — Telehealth: Payer: Self-pay | Admitting: Internal Medicine

## 2019-05-03 VITALS — BP 124/80 | HR 68 | Temp 98.7°F | Ht 60.0 in | Wt 180.0 lb

## 2019-05-03 DIAGNOSIS — F329 Major depressive disorder, single episode, unspecified: Secondary | ICD-10-CM | POA: Insufficient documentation

## 2019-05-03 DIAGNOSIS — Z720 Tobacco use: Secondary | ICD-10-CM | POA: Diagnosis not present

## 2019-05-03 DIAGNOSIS — I10 Essential (primary) hypertension: Secondary | ICD-10-CM | POA: Diagnosis not present

## 2019-05-03 DIAGNOSIS — C73 Malignant neoplasm of thyroid gland: Secondary | ICD-10-CM | POA: Diagnosis not present

## 2019-05-03 DIAGNOSIS — F32A Depression, unspecified: Secondary | ICD-10-CM | POA: Insufficient documentation

## 2019-05-03 DIAGNOSIS — F330 Major depressive disorder, recurrent, mild: Secondary | ICD-10-CM

## 2019-05-03 NOTE — Assessment & Plan Note (Signed)
With recent US and biopsy through [facility]. Reviewed pathology report with her today and suspicious for papillary thyroid cancer, 3.4 cm left thyroid. We talked about likely need for surgical resection and referral to surgery today to discuss and pursue this. Recent TSH normal.

## 2019-05-03 NOTE — Patient Instructions (Signed)
We will get you in with the surgeon that specializes in head and neck surgery.   They think that the nodule in the left side of the thyroid is suspicious for papillary thyroid cancer. This should likely be removed with surgery.

## 2019-05-03 NOTE — Assessment & Plan Note (Signed)
Started with pregnancy about 11 years ago. Currently taking hctz 25 mg daily. Recent CMP with [facility] reviewed and appropriate. No change today.

## 2019-05-03 NOTE — Progress Notes (Signed)
   Subjective:   Patient ID: Jessica Mcpherson, female    DOB: November 07, 1985, 34 y.o.   MRN: [id number]  HPI The patient is a new 34 YO female coming in for for concerns about thyroid problem (seen by prior primary care and they found a nodule on the thyroid, ultrasound done, then biopsy performed about 2-3 weeks ago, she has not heard back about the results and is not happy with the lack of communication from that office), and blood pressure (started having problems with pregnancy of daughter about 11 years ago, sometimes has good BP for 1-2 years, then may have high BP and need medication for a period, started taking hctz again in the last several months due to having high BP, tolerates well, denies chest pains or headaches), and tobacco abuse (smoking more recently due to pandemic, depression, and this unresolved thyroid issue, thinking about quitting) and depression (started a couple years ago with loss of nephew, doing okay off and on with this, has bad days and good days, pandemic has worsened this some, uses trazodone prn for sleeping and depression but it leaves her feeling lethargic the next day so she cannot take often, has done counseling previously but not currently felt some benefit with this).    PMH, [facility], social history reviewed and updated  Review of Systems  Constitutional: Negative.   HENT: Negative.   Eyes: Negative.   Respiratory: Negative for cough, chest tightness and shortness of breath.   Cardiovascular: Negative for chest pain, palpitations and leg swelling.  Gastrointestinal: Negative for abdominal distention, abdominal pain, constipation, diarrhea, nausea and vomiting.  Musculoskeletal: Negative.   Skin: Negative.   Neurological: Negative.   Psychiatric/Behavioral: Positive for decreased concentration and dysphoric mood.    Objective:  Physical Exam Constitutional:      Appearance: She is well-developed. She is obese.  HENT:     Head: Normocephalic and atraumatic.    Neck:     Comments: Thyromegaly on exam Cardiovascular:     Rate and Rhythm: Normal rate and regular rhythm.  Pulmonary:     Effort: Pulmonary effort is normal. No respiratory distress.     Breath sounds: Normal breath sounds. No wheezing or rales.  Abdominal:     General: Bowel sounds are normal. There is no distension.     Palpations: Abdomen is soft.     Tenderness: There is no abdominal tenderness. There is no rebound.  Musculoskeletal:     Cervical back: Normal range of motion.  Skin:    General: Skin is warm and dry.  Neurological:     Mental Status: She is alert and oriented to person, place, and time.     Coordination: Coordination normal.     Vitals:   05/03/19 1011  BP: 124/80  Pulse: 68  Temp: 98.7 F (37.1 C)  SpO2: 98%  Weight: 180 lb (81.6 kg)  Height: 5' (1.524 m)    This visit occurred during the SARS-CoV-2 public health emergency.  Safety protocols were in place, including screening questions prior to the visit, additional usage of staff PPE, and extensive cleaning of exam room while observing appropriate contact time as indicated for disinfecting solutions.   Assessment & Plan:

## 2019-05-03 NOTE — Assessment & Plan Note (Signed)
Does not feel medication is needed at this time. Advised on coping skills and that she should consider resuming therapy. Has trazodone prn for sleeping/mood.

## 2019-05-03 NOTE — Telephone Encounter (Signed)
FMLA forms have been completed starting 04/14/2019 for 71months.   Copy sent to scan, Original mailed to patient.   Called patient x2 VM box is full, I did not have a fax number. If patient calls back with a fax number, I am happy to fax for her.

## 2019-05-03 NOTE — Assessment & Plan Note (Signed)
Advised to quit smoking. She will work on that. Reminded of health harms from cigarette smoking.

## 2019-05-18 ENCOUNTER — Ambulatory Visit: Payer: Self-pay | Admitting: Surgery

## 2019-06-02 ENCOUNTER — Encounter: Payer: Self-pay | Admitting: Internal Medicine

## 2019-06-02 DIAGNOSIS — R5383 Other fatigue: Secondary | ICD-10-CM

## 2019-06-15 ENCOUNTER — Ambulatory Visit: Payer: 59 | Admitting: Internal Medicine

## 2019-06-17 ENCOUNTER — Encounter: Payer: Self-pay | Admitting: Internal Medicine

## 2019-06-17 ENCOUNTER — Other Ambulatory Visit: Payer: Self-pay

## 2019-06-17 ENCOUNTER — Ambulatory Visit: Payer: 59 | Admitting: Internal Medicine

## 2019-06-17 DIAGNOSIS — C73 Malignant neoplasm of thyroid gland: Secondary | ICD-10-CM | POA: Diagnosis not present

## 2019-06-17 DIAGNOSIS — R5383 Other fatigue: Secondary | ICD-10-CM | POA: Diagnosis not present

## 2019-06-17 DIAGNOSIS — F330 Major depressive disorder, recurrent, mild: Secondary | ICD-10-CM

## 2019-06-17 LAB — T4, FREE: Free T4: 0.72 ng/dL (ref 0.60–1.60)

## 2019-06-17 LAB — CBC
HCT: 40.4 % (ref 36.0–46.0)
Hemoglobin: 13.3 g/dL (ref 12.0–15.0)
MCHC: 32.9 g/dL (ref 30.0–36.0)
MCV: 89.7 fl (ref 78.0–100.0)
Platelets: 263 10*3/uL (ref 150.0–400.0)
RBC: 4.51 Mil/uL (ref 3.87–5.11)
RDW: 14.5 % (ref 11.5–15.5)
WBC: 10 10*3/uL (ref 4.0–10.5)

## 2019-06-17 LAB — COMPREHENSIVE METABOLIC PANEL
ALT: 34 U/L (ref 0–35)
AST: 21 U/L (ref 0–37)
Albumin: 4 g/dL (ref 3.5–5.2)
Alkaline Phosphatase: 72 U/L (ref 39–117)
BUN: 11 mg/dL (ref 6–23)
CO2: 25 mEq/L (ref 19–32)
Calcium: 9.2 mg/dL (ref 8.4–10.5)
Chloride: 106 mEq/L (ref 96–112)
Creatinine, Ser: 0.74 mg/dL (ref 0.40–1.20)
GFR: 109.48 mL/min (ref >60.00)
Glucose, Bld: 101 mg/dL — ABNORMAL HIGH (ref 70–99)
Potassium: 4 mEq/L (ref 3.5–5.1)
Sodium: 137 mEq/L (ref 135–145)
Total Bilirubin: 0.2 mg/dL (ref 0.2–1.2)
Total Protein: 7.1 g/dL (ref 6.0–8.3)

## 2019-06-17 LAB — VITAMIN B12: Vitamin B-12: 978 pg/mL — ABNORMAL HIGH (ref 211–911)

## 2019-06-17 LAB — TSH: TSH: 0.86 u[IU]/mL (ref 0.35–4.50)

## 2019-06-17 LAB — VITAMIN D 25 HYDROXY (VIT D DEFICIENCY, FRACTURES): VITD: 13.31 ng/mL — ABNORMAL LOW (ref 30.00–100.00)

## 2019-06-17 NOTE — Assessment & Plan Note (Signed)
If labs normal can consider remeron to help with multiple of her symptoms.

## 2019-06-17 NOTE — Assessment & Plan Note (Signed)
Needs labs to assess new/worsening symptoms. Checking CBC, CMP, TSH, T4, vitamin levels.

## 2019-06-17 NOTE — Progress Notes (Signed)
   Subjective:   Patient ID: Jessica Mcpherson, female    DOB: Jun 24, 1985, 34 y.o.   MRN: [id number]  HPI The patient is a 34 YO female coming in for poor appetite, stress and fatigue. She does have known thyroid cancer and is awaiting upcoming surgery. She has very poor appetite the last several months. She is drinking fluids. 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 Denies fevers or chills. She is stressed about waiting for the surgery which is not until next month as well as other things.   Review of Systems  Constitutional: Positive for activity change, appetite change and fatigue.  HENT: Negative.   Eyes: Negative.   Respiratory: Negative for cough, chest tightness and shortness of breath.   Cardiovascular: Negative for chest pain, palpitations and leg swelling.  Gastrointestinal: Negative for abdominal distention, abdominal pain, constipation, diarrhea, nausea and vomiting.  Endocrine: Positive for heat intolerance.  Musculoskeletal: Positive for myalgias.  Skin: Negative.   Neurological: Negative.   Psychiatric/Behavioral: Positive for sleep disturbance. The patient is nervous/anxious.     Objective:  Physical Exam Constitutional:      Appearance: She is well-developed.  HENT:     Head: Normocephalic and atraumatic.  Cardiovascular:     Rate and Rhythm: Normal rate and regular rhythm.  Pulmonary:     Effort: Pulmonary effort is normal. No respiratory distress.     Breath sounds: Normal breath sounds. No wheezing or rales.  Abdominal:     General: Bowel sounds are normal. There is no distension.     Palpations: Abdomen is soft.     Tenderness: There is no abdominal tenderness. There is no rebound.  Musculoskeletal:     Cervical back: Normal range of motion.  Skin:    General: Skin is warm and dry.  Neurological:     Mental Status: She is alert and oriented to person, place, and  time.     Coordination: Coordination normal.     Vitals:   06/17/19 1307  BP: (!) 142/80  Pulse: 78  Temp: 98.4 F (36.9 C)  SpO2: 100%  Weight: 180 lb 9.6 oz (81.9 kg)  Height: 5' (1.524 m)   This visit occurred during the SARS-CoV-2 public health emergency.  Safety protocols were in place, including screening questions prior to the visit, additional usage of staff PPE, and extensive cleaning of exam room while observing appropriate contact time as indicated for disinfecting solutions.   Assessment & Plan:

## 2019-06-17 NOTE — Patient Instructions (Signed)
We will check the labs today and put you on light duty until surgery.   If the labs are normal we can start you on a medicine to help with appetite and stress levels just until the surgery.

## 2019-06-18 DIAGNOSIS — R5383 Other fatigue: Secondary | ICD-10-CM | POA: Insufficient documentation

## 2019-06-18 NOTE — Assessment & Plan Note (Signed)
Likely multifactorial including depression, lack of sleep, thyroid related. Checking labs and address as appropriate.

## 2019-06-20 ENCOUNTER — Other Ambulatory Visit: Payer: Self-pay | Admitting: Internal Medicine

## 2019-06-20 MED ORDER — VITAMIN D (ERGOCALCIFEROL) 1.25 MG (50000 UNIT) PO CAPS: 50000.0000 [IU] | ORAL_CAPSULE | ORAL | 0 refills | Status: DC

## 2019-07-06 ENCOUNTER — Encounter (HOSPITAL_COMMUNITY): Payer: Self-pay

## 2019-07-06 NOTE — Patient Instructions (Signed)
DUE TO COVID-19 ONLY ONE VISITOR ARE ALLOWED TO COME WITH YOU AND STAY IN THE WAITING ROOM ONLY DURING PRE OP AND PROCEDURE. THEN TWO VISITORS MAY VISIT WITH YOU IN YOUR PRIVATE ROOM DURING VISITING HOURS ONLY!!   COVID SWAB TESTING MUST BE COMPLETED ON:   Tuesday, July 19, 2019 at 2:30 PM   [address], [place] -[facility] enter pre surgical testing line (Must self quarantine after testing. Follow instructions on handout.)             Your procedure is scheduled on: Friday, July 22, 2019   Report to [facility] Main  Entrance   Report to Short Stay at 5:30 AM   [facility])   Call this number if you have problems the morning of surgery [phone]   Do not eat food:After Midnight.   May have liquids until 4:30 AM   day of surgery   CLEAR LIQUID DIET  Foods Allowed                                                                     Foods Excluded  Water, Black Coffee and tea, regular and decaf                             liquids that you cannot  Plain Jell-O in any flavor  (No red)                                           see through such as: Fruit ices (not with fruit pulp)                                     milk, soups, orange juice  Iced Popsicles (No red)                                    All solid food                                   Apple juices Sports drinks like Gatorade (No red) Lightly seasoned clear broth or consume(fat free) Sugar, honey syrup  Sample Menu Breakfast                                Lunch                                     Supper Cranberry juice                    Beef broth                            Chicken broth  Jell-O                                     Grape juice                           Apple juice Coffee or tea                        Jell-O                                      Popsicle                                                Coffee or tea                        Coffee or tea   Oral Hygiene is  also important to reduce your risk of infection.                                    Remember - BRUSH YOUR TEETH THE MORNING OF SURGERY WITH YOUR REGULAR TOOTHPASTE   Do NOT smoke after Midnight   Take these medicines the morning of surgery with A SIP OF WATER: None                               You may not have any metal on your body including hair pins, jewelry, and body piercings             Do not wear make-up, lotions, powders, perfumes/cologne, or deodorant             Do not wear nail polish.  Do not shave  48 hours prior to surgery.                Do not bring valuables to the hospital. [place].   Contacts, dentures or bridgework may not be worn into surgery.   Bring small overnight bag day of surgery.    Special Instructions: Bring a copy of your healthcare power of attorney and living will documents         the day of surgery if you haven't scanned them in before.              Please read over the following fact sheets you were given: IF YOU HAVE QUESTIONS ABOUT YOUR PRE OP INSTRUCTIONS PLEASE CALL [phone]   [place] - Preparing for Surgery Before surgery, you can play an important role.  Because skin is not sterile, your skin needs to be as free of germs as possible.  You can reduce the number of germs on your skin by washing with CHG (chlorahexidine gluconate) soap before surgery.  CHG is an antiseptic cleaner which kills germs and bonds with the skin to continue killing germs even after washing. Please DO NOT use  if you have an allergy to CHG or antibacterial soaps.  If your skin becomes reddened/irritated stop using the CHG and inform your nurse when you arrive at Short Stay. Do not shave (including legs and underarms) for at least 48 hours prior to the first CHG shower.  You may shave your face/neck.  Please follow these instructions carefully:  1.  Shower with CHG Soap the night before surgery and the  morning of  surgery.  2.  If you choose to wash your hair, wash your hair first as usual with your normal  shampoo.  3.  After you shampoo, rinse your hair and body thoroughly to remove the shampoo.                             4.  Use CHG as you would any other liquid soap.  You can apply chg directly to the skin and wash.  Gently with a scrungie or clean washcloth.  5.  Apply the CHG Soap to your body ONLY FROM THE NECK DOWN.   Do   not use on face/ open                           Wound or open sores. Avoid contact with eyes, ears mouth and   genitals (private parts).                       Wash face,  Genitals (private parts) with your normal soap.             6.  Wash thoroughly, paying special attention to the area where your    surgery  will be performed.  7.  Thoroughly rinse your body with warm water from the neck down.  8.  DO NOT shower/wash with your normal soap after using and rinsing off the CHG Soap.                9.  Pat yourself dry with a clean towel.            10.  Wear clean pajamas.            11.  Place clean sheets on your bed the night of your first shower and do not  sleep with pets. Day of Surgery : Do not apply any lotions/deodorants the morning of surgery.  Please wear clean clothes to the hospital/surgery center.  FAILURE TO FOLLOW THESE INSTRUCTIONS MAY RESULT IN THE CANCELLATION OF YOUR SURGERY  PATIENT SIGNATURE_________________________________  NURSE SIGNATURE__________________________________  ________________________________________________________________________

## 2019-07-06 NOTE — Progress Notes (Addendum)
COVID Vaccine Completed: No Date COVID Vaccine completed:N/A COVID vaccine manufacturer: N/A  PCP - Dr. Lesly Rubenstein last office visit 06/18/2019 Cardiologist - N/A  Chest x-ray - 07/11/2019 in epic EKG - 07/11/2019 in epic Stress Test - N/A ECHO - N/A Cardiac Cath - N/A  Sleep Study - N/A CPAP - N/A  Fasting Blood Sugar - N/A Checks Blood Sugar __N/A___ times a day  Blood Thinner Instructions:N/A Aspirin Instructions:N/A Last Dose:N/A  Anesthesia review: N/A  Patient denies shortness of breath, fever, cough and chest pain at PAT appointment   Patient verbalized understanding of instructions that were given to them at the PAT appointment. Patient was also instructed that they will need to review over the PAT instructions again at home before surgery.

## 2019-07-11 ENCOUNTER — Other Ambulatory Visit: Payer: Self-pay

## 2019-07-11 ENCOUNTER — Encounter (HOSPITAL_COMMUNITY): Payer: Self-pay

## 2019-07-11 ENCOUNTER — Ambulatory Visit (HOSPITAL_COMMUNITY)
Admission: RE | Admit: 2019-07-11 | Discharge: 2019-07-11 | Disposition: A | Discharge status: Home / Self Care | Payer: 59 | Source: Ambulatory Visit | Attending: Anesthesiology | Admitting: Anesthesiology

## 2019-07-11 ENCOUNTER — Encounter (HOSPITAL_COMMUNITY)
Admission: RE | Admit: 2019-07-11 | Discharge: 2019-07-11 | Disposition: A | Discharge status: Home / Self Care | Payer: 59 | Source: Ambulatory Visit | Attending: Surgery | Admitting: Surgery

## 2019-07-11 ENCOUNTER — Encounter: Payer: Self-pay | Admitting: Internal Medicine

## 2019-07-11 DIAGNOSIS — Z01818 Encounter for other preprocedural examination: Secondary | ICD-10-CM | POA: Insufficient documentation

## 2019-07-11 HISTORY — DX: Unspecified osteoarthritis, unspecified site: M19.90

## 2019-07-11 HISTORY — DX: Headache, unspecified: R51.9

## 2019-07-11 HISTORY — DX: Anxiety disorder, unspecified: F41.9

## 2019-07-11 HISTORY — DX: Malignant neoplasm of thyroid gland: C73

## 2019-07-11 HISTORY — DX: Gastro-esophageal reflux disease without esophagitis: K21.9

## 2019-07-11 LAB — CBC
HCT: 42.7 % (ref 36.0–46.0)
Hemoglobin: 13.8 g/dL (ref 12.0–15.0)
MCH: 29.7 pg (ref 26.0–34.0)
MCHC: 32.3 g/dL (ref 30.0–36.0)
MCV: 92 fL (ref 80.0–100.0)
Platelets: 321 10*3/uL (ref 150–400)
RBC: 4.64 MIL/uL (ref 3.87–5.11)
RDW: 14.1 % (ref 11.5–15.5)
WBC: 12.1 10*3/uL — ABNORMAL HIGH (ref 4.0–10.5)
nRBC: 0 % (ref 0.0–0.2)

## 2019-07-18 ENCOUNTER — Encounter (HOSPITAL_COMMUNITY): Payer: Self-pay | Admitting: Surgery

## 2019-07-18 NOTE — H&P (Signed)
General Surgery [facility]  Jessica Mcpherson DOB: 04-05-1985 Married / Language: English / Race: Black or African American Female  History of Present Illness   The patient is a 34 year old female who presents with thyroid cancer.  CHIEF COMPLAINT:  thyroid nodule, probable papillary carcinoma  Patient is referred by Dr. Pricilla Holm for surgical evaluation and management of thyroid nodule with probable papillary thyroid carcinoma. Patient had been evaluated by an urgent care clinic for right shoulder pain. As part of her physical examination, they noted a thyroid nodule. Patient subsequently underwent thyroid ultrasound which showed a normal size thyroid gland with a dominant nodule in the mid left thyroid lobe measuring 3.4 cm in size. This was felt to be highly suspicious based on ultrasound criteria and biopsy was recommended. Subsequent fine-needle aspiration biopsy in early April 2021 demonstrated findings suspicious for papillary thyroid carcinoma. Patient was referred to our practice for further evaluation and management. Patient has no prior history of thyroid disease. She has never been on thyroid medication. She has no history of thyroid nodules. She has had no prior head or neck surgery. Patient is accompanied today by her mother and there is no family history of thyroid disorders. There is no history of other endocrine neoplasms. Patient works as a Dispensing optician. She denies any compressive symptoms.   Allergies  Penicillamine *ASSORTED CLASSES*  Allergies Reconciled   Medication History  hydroCHLOROthiazide (25MG  Tablet, Oral) Active. Medications Reconciled  Vitals  Weight: 178.4 lb Height: 184in Body Surface Area: 4.01 m Body Mass Index: 3.7 kg/m  Temp.: 97.56F (Tympanic)  Pulse: 84 (Regular)  BP: 126/72(Sitting, Left Arm, Standard)  Physical Exam  GENERAL APPEARANCE Development: normal Nutritional status:  normal Gross deformities: none  SKIN Rash, lesions, ulcers: none Induration, erythema: none Nodules: none palpable  EYES Conjunctiva and lids: normal Pupils: equal and reactive Iris: normal bilaterally  EARS, NOSE, MOUTH, THROAT External ears: no lesion or deformity External nose: no lesion or deformity Hearing: grossly normal Due to Covid-19 pandemic, patient is wearing a mask.  NECK Symmetric: no Trachea: midline Thyroid: Palpation of the right thyroid lobe shows no significant abnormality. Palpation on the left side shows a approximately 3 cm relatively firm nodule in the midportion of the left lobe which is mobile with swallowing. This is visible on full extension of the neck was swallowing maneuver. There is no associated lymphadenopathy. There is no tenderness.  CHEST Respiratory effort: normal Retraction or accessory muscle use: no Breath sounds: normal bilaterally Rales, rhonchi, wheeze: none  CARDIOVASCULAR Auscultation: regular rhythm, normal rate Murmurs: none Pulses: radial pulse 2+ palpable Lower extremity edema: none  MUSCULOSKELETAL Station and gait: normal Digits and nails: no clubbing or cyanosis Muscle strength: grossly normal all extremities Range of motion: grossly normal all extremities Deformity: none  LYMPHATIC Cervical: none palpable Supraclavicular: none palpable  PSYCHIATRIC Oriented to person, place, and time: yes Mood and affect: normal for situation Judgment and insight: appropriate for situation    Assessment & Plan  PAPILLARY THYROID CARCINOMA (C73)  Patient is referred by her primary care physician for surgical evaluation and management of newly diagnosed papillary thyroid carcinoma.  Patient provided with a copy of "The Thyroid Book: Medical and Surgical Treatment of Thyroid Problems", published by [employer], 16 pages. Book reviewed and explained to patient during visit today.  We reviewed her ultrasound examination as well  as her laboratory studies and fine-needle aspiration biopsy results. We discussed her clinical history. We discussed the options  for surgical management, being a left thyroid lobectomy versus total thyroidectomy. We discussed risk and benefits of each procedure including the potential for recurrent laryngeal nerve injury and injury to parathyroid glands. We discussed the location and size of the surgical incision. We discussed the hospital stay to be anticipated. We discussed the potential need for radioactive iodine treatment. We discussed the need for lifelong thyroid hormone replacement. After a full discussion, the patient would like to proceed with total thyroidectomy. We will plan to do this procedure with lymph node sampling if necessary. We discussed the overnight hospital stay and her postoperative recovery and return to work and activities. We discussed the likely need for endocrinology consultation following surgery.  The risks and benefits of the procedure have been discussed at length with the patient. The patient understands the proposed procedure, potential alternative treatments, and the course of recovery to be expected. All of the patient's questions have been answered at this time. The patient wishes to proceed with surgery.  Armandina Gemma, MD [facility] Office: [phone]

## 2019-07-19 ENCOUNTER — Other Ambulatory Visit (HOSPITAL_COMMUNITY)
Admission: RE | Admit: 2019-07-19 | Discharge: 2019-07-19 | Disposition: A | Discharge status: Home / Self Care | Payer: 59 | Source: Ambulatory Visit | Attending: Surgery | Admitting: Surgery

## 2019-07-19 DIAGNOSIS — Z20822 Contact with and (suspected) exposure to covid-19: Secondary | ICD-10-CM | POA: Insufficient documentation

## 2019-07-19 DIAGNOSIS — Z01812 Encounter for preprocedural laboratory examination: Secondary | ICD-10-CM | POA: Diagnosis present

## 2019-07-19 LAB — SARS CORONAVIRUS 2 (TAT 6-24 HRS): SARS Coronavirus 2: NEGATIVE

## 2019-07-22 ENCOUNTER — Ambulatory Visit (HOSPITAL_COMMUNITY): Payer: 59 | Admitting: Certified Registered"

## 2019-07-22 ENCOUNTER — Other Ambulatory Visit: Payer: Self-pay

## 2019-07-22 ENCOUNTER — Encounter (HOSPITAL_COMMUNITY): Payer: Self-pay | Admitting: Surgery

## 2019-07-22 ENCOUNTER — Encounter (HOSPITAL_COMMUNITY)
Admission: RE | Disposition: A | Discharge status: Home / Self Care | Payer: Self-pay | Source: Ambulatory Visit | Attending: Surgery

## 2019-07-22 ENCOUNTER — Ambulatory Visit (HOSPITAL_COMMUNITY)
Admission: RE | Admit: 2019-07-22 | Discharge: 2019-07-23 | Disposition: A | Discharge status: Home / Self Care | Payer: 59 | Source: Ambulatory Visit | Attending: Surgery | Admitting: Surgery

## 2019-07-22 DIAGNOSIS — I10 Essential (primary) hypertension: Secondary | ICD-10-CM | POA: Diagnosis not present

## 2019-07-22 DIAGNOSIS — Z91018 Allergy to other foods: Secondary | ICD-10-CM | POA: Insufficient documentation

## 2019-07-22 DIAGNOSIS — C771 Secondary and unspecified malignant neoplasm of intrathoracic lymph nodes: Secondary | ICD-10-CM | POA: Insufficient documentation

## 2019-07-22 DIAGNOSIS — C73 Malignant neoplasm of thyroid gland: Secondary | ICD-10-CM | POA: Diagnosis present

## 2019-07-22 DIAGNOSIS — Z880 Allergy status to penicillin: Secondary | ICD-10-CM | POA: Insufficient documentation

## 2019-07-22 DIAGNOSIS — Z79899 Other long term (current) drug therapy: Secondary | ICD-10-CM | POA: Diagnosis not present

## 2019-07-22 DIAGNOSIS — F17200 Nicotine dependence, unspecified, uncomplicated: Secondary | ICD-10-CM | POA: Diagnosis not present

## 2019-07-22 HISTORY — PX: THYROIDECTOMY: SHX17

## 2019-07-22 LAB — PREGNANCY, URINE: Preg Test, Ur: NEGATIVE

## 2019-07-22 SURGERY — THYROIDECTOMY
Anesthesia: General

## 2019-07-22 MED ORDER — OXYCODONE HCL 5 MG/5ML PO SOLN: 5.0000 mg | Freq: Once | ORAL | Status: DC | PRN

## 2019-07-22 MED ORDER — DEXAMETHASONE SODIUM PHOSPHATE 10 MG/ML IJ SOLN
INTRAMUSCULAR | Status: AC
  Filled 2019-07-22: qty 1

## 2019-07-22 MED ORDER — OXYCODONE HCL 5 MG PO TABS: 5.0000 mg | ORAL_TABLET | ORAL | 0 refills | Status: DC | PRN

## 2019-07-22 MED ORDER — PROPOFOL 10 MG/ML IV BOLUS
INTRAVENOUS | Status: DC | PRN
  Administered 2019-07-22: 180 mg via INTRAVENOUS

## 2019-07-22 MED ORDER — TRAZODONE HCL 50 MG PO TABS: 50.0000 mg | ORAL_TABLET | Freq: Every evening | ORAL | Status: DC | PRN

## 2019-07-22 MED ORDER — 0.9 % SODIUM CHLORIDE (POUR BTL) OPTIME
TOPICAL | Status: DC | PRN
  Administered 2019-07-22: 1000 mL

## 2019-07-22 MED ORDER — ORAL CARE MOUTH RINSE: 15.0000 mL | Freq: Once | OROMUCOSAL | Status: AC

## 2019-07-22 MED ORDER — ACETAMINOPHEN 325 MG PO TABS
650.0000 mg | ORAL_TABLET | Freq: Four times a day (QID) | ORAL | Status: DC | PRN
  Administered 2019-07-22: 650 mg via ORAL
  Filled 2019-07-22 (×2): qty 2

## 2019-07-22 MED ORDER — ONDANSETRON HCL 4 MG/2ML IJ SOLN
INTRAMUSCULAR | Status: AC
  Filled 2019-07-22: qty 2

## 2019-07-22 MED ORDER — ACETAMINOPHEN 650 MG RE SUPP: 650.0000 mg | Freq: Four times a day (QID) | RECTAL | Status: DC | PRN

## 2019-07-22 MED ORDER — LACTATED RINGERS IV SOLN: INTRAVENOUS | Status: DC

## 2019-07-22 MED ORDER — HYDROMORPHONE HCL 1 MG/ML IJ SOLN
0.2500 mg | INTRAMUSCULAR | Status: DC | PRN
  Administered 2019-07-22 (×4): 0.5 mg via INTRAVENOUS

## 2019-07-22 MED ORDER — PROMETHAZINE HCL 25 MG/ML IJ SOLN
INTRAMUSCULAR | Status: AC
  Filled 2019-07-22: qty 1

## 2019-07-22 MED ORDER — OXYCODONE HCL 5 MG PO TABS: 5.0000 mg | ORAL_TABLET | Freq: Once | ORAL | Status: DC | PRN

## 2019-07-22 MED ORDER — DEXAMETHASONE SODIUM PHOSPHATE 10 MG/ML IJ SOLN
INTRAMUSCULAR | Status: DC | PRN
  Administered 2019-07-22: 10 mg via INTRAVENOUS

## 2019-07-22 MED ORDER — LIDOCAINE 2% (20 MG/ML) 5 ML SYRINGE
INTRAMUSCULAR | Status: DC | PRN
  Administered 2019-07-22: 80 mg via INTRAVENOUS

## 2019-07-22 MED ORDER — MIDAZOLAM HCL 2 MG/2ML IJ SOLN
INTRAMUSCULAR | Status: AC
  Filled 2019-07-22: qty 2

## 2019-07-22 MED ORDER — CALCIUM CARBONATE 1250 (500 CA) MG PO TABS
2.0000 | ORAL_TABLET | Freq: Three times a day (TID) | ORAL | Status: DC
  Administered 2019-07-22 – 2019-07-23 (×3): 1000 mg via ORAL
  Filled 2019-07-22 (×3): qty 1

## 2019-07-22 MED ORDER — FENTANYL CITRATE (PF) 250 MCG/5ML IJ SOLN
INTRAMUSCULAR | Status: AC
  Filled 2019-07-22: qty 5

## 2019-07-22 MED ORDER — CHLORHEXIDINE GLUCONATE CLOTH 2 % EX PADS: 6.0000 | MEDICATED_PAD | Freq: Once | CUTANEOUS | Status: DC

## 2019-07-22 MED ORDER — SUGAMMADEX SODIUM 200 MG/2ML IV SOLN
INTRAVENOUS | Status: DC | PRN
  Administered 2019-07-22: 175 mg via INTRAVENOUS

## 2019-07-22 MED ORDER — PROMETHAZINE HCL 25 MG/ML IJ SOLN
6.2500 mg | INTRAMUSCULAR | Status: DC | PRN
  Administered 2019-07-22: 6.25 mg via INTRAVENOUS

## 2019-07-22 MED ORDER — OXYCODONE HCL 5 MG PO TABS
5.0000 mg | ORAL_TABLET | ORAL | Status: DC | PRN
  Administered 2019-07-22 (×3): 5 mg via ORAL
  Administered 2019-07-23: 10 mg via ORAL
  Filled 2019-07-22: qty 2
  Filled 2019-07-22 (×2): qty 1
  Filled 2019-07-22: qty 2
  Filled 2019-07-22: qty 1

## 2019-07-22 MED ORDER — HYDROMORPHONE HCL 1 MG/ML IJ SOLN
INTRAMUSCULAR | Status: AC
  Filled 2019-07-22: qty 1

## 2019-07-22 MED ORDER — ROCURONIUM BROMIDE 10 MG/ML (PF) SYRINGE
PREFILLED_SYRINGE | INTRAVENOUS | Status: DC | PRN
  Administered 2019-07-22: 10 mg via INTRAVENOUS
  Administered 2019-07-22: 60 mg via INTRAVENOUS

## 2019-07-22 MED ORDER — PHENYLEPHRINE 40 MCG/ML (10ML) SYRINGE FOR IV PUSH (FOR BLOOD PRESSURE SUPPORT)
PREFILLED_SYRINGE | INTRAVENOUS | Status: DC | PRN
  Administered 2019-07-22: 80 ug via INTRAVENOUS

## 2019-07-22 MED ORDER — ONDANSETRON 4 MG PO TBDP
4.0000 mg | ORAL_TABLET | Freq: Four times a day (QID) | ORAL | Status: DC | PRN
  Filled 2019-07-22: qty 1

## 2019-07-22 MED ORDER — MEPERIDINE HCL 50 MG/ML IJ SOLN: 6.2500 mg | INTRAMUSCULAR | Status: DC | PRN

## 2019-07-22 MED ORDER — CIPROFLOXACIN IN D5W 400 MG/200ML IV SOLN
400.0000 mg | INTRAVENOUS | Status: AC
  Administered 2019-07-22: 400 mg via INTRAVENOUS
  Filled 2019-07-22: qty 200

## 2019-07-22 MED ORDER — CHLORHEXIDINE GLUCONATE 0.12 % MT SOLN
15.0000 mL | Freq: Once | OROMUCOSAL | Status: AC
  Administered 2019-07-22: 15 mL via OROMUCOSAL

## 2019-07-22 MED ORDER — HYDROMORPHONE HCL 1 MG/ML IJ SOLN: 1.0000 mg | INTRAMUSCULAR | Status: DC | PRN

## 2019-07-22 MED ORDER — PHENYLEPHRINE 40 MCG/ML (10ML) SYRINGE FOR IV PUSH (FOR BLOOD PRESSURE SUPPORT)
PREFILLED_SYRINGE | INTRAVENOUS | Status: AC
  Filled 2019-07-22: qty 10

## 2019-07-22 MED ORDER — ONDANSETRON HCL 4 MG/2ML IJ SOLN
INTRAMUSCULAR | Status: DC | PRN
  Administered 2019-07-22: 4 mg via INTRAVENOUS

## 2019-07-22 MED ORDER — FENTANYL CITRATE (PF) 100 MCG/2ML IJ SOLN
INTRAMUSCULAR | Status: DC | PRN
  Administered 2019-07-22: 100 ug via INTRAVENOUS
  Administered 2019-07-22 (×3): 50 ug via INTRAVENOUS

## 2019-07-22 MED ORDER — LIDOCAINE 2% (20 MG/ML) 5 ML SYRINGE
INTRAMUSCULAR | Status: AC
  Filled 2019-07-22: qty 5

## 2019-07-22 MED ORDER — PROPOFOL 10 MG/ML IV BOLUS
INTRAVENOUS | Status: AC
  Filled 2019-07-22: qty 20

## 2019-07-22 MED ORDER — FENTANYL CITRATE (PF) 100 MCG/2ML IJ SOLN
INTRAMUSCULAR | Status: AC
  Filled 2019-07-22: qty 2

## 2019-07-22 MED ORDER — LEVOTHYROXINE SODIUM 100 MCG PO TABS: 100.0000 ug | ORAL_TABLET | Freq: Every day | ORAL | 11 refills | Status: DC

## 2019-07-22 MED ORDER — SODIUM CHLORIDE 0.45 % IV SOLN
INTRAVENOUS | Status: DC
  Administered 2019-07-22: 50 mL/h via INTRAVENOUS

## 2019-07-22 MED ORDER — DEXMEDETOMIDINE HCL IN NACL 200 MCG/50ML IV SOLN
INTRAVENOUS | Status: AC
  Filled 2019-07-22: qty 50

## 2019-07-22 MED ORDER — AMISULPRIDE (ANTIEMETIC) 5 MG/2ML IV SOLN: 10.0000 mg | Freq: Once | INTRAVENOUS | Status: DC | PRN

## 2019-07-22 MED ORDER — CALCIUM CARBONATE ANTACID 500 MG PO CHEW
2.0000 | CHEWABLE_TABLET | Freq: Three times a day (TID) | ORAL | 1 refills | Status: DC
Start: 2019-07-22 — End: 2019-07-23

## 2019-07-22 MED ORDER — ONDANSETRON HCL 4 MG/2ML IJ SOLN
4.0000 mg | Freq: Four times a day (QID) | INTRAMUSCULAR | Status: DC | PRN
  Administered 2019-07-22: 4 mg via INTRAVENOUS
  Filled 2019-07-22: qty 2

## 2019-07-22 MED ORDER — DEXMEDETOMIDINE HCL IN NACL 200 MCG/50ML IV SOLN
INTRAVENOUS | Status: DC | PRN
Start: 2019-07-22 — End: 2019-07-22
  Administered 2019-07-22 (×5): 8 ug via INTRAVENOUS

## 2019-07-22 MED ORDER — ROCURONIUM BROMIDE 10 MG/ML (PF) SYRINGE
PREFILLED_SYRINGE | INTRAVENOUS | Status: AC
  Filled 2019-07-22: qty 10

## 2019-07-22 MED ORDER — MIDAZOLAM HCL 5 MG/5ML IJ SOLN
INTRAMUSCULAR | Status: DC | PRN
  Administered 2019-07-22: 2 mg via INTRAVENOUS

## 2019-07-22 MED ORDER — TRAMADOL HCL 50 MG PO TABS
50.0000 mg | ORAL_TABLET | Freq: Four times a day (QID) | ORAL | Status: DC | PRN
  Administered 2019-07-23: 50 mg via ORAL
  Filled 2019-07-22: qty 1

## 2019-07-22 SURGICAL SUPPLY — 28 items
ATTRACTOMAT 16X20 MAGNETIC DRP (DRAPES) ×2 IMPLANT
BLADE SURG 15 STRL LF DISP TIS (BLADE) ×1 IMPLANT
BLADE SURG 15 STRL SS (BLADE) ×2
CHLORAPREP W/TINT 26 (MISCELLANEOUS) ×2 IMPLANT
CLIP VESOCCLUDE MED 6/CT (CLIP) ×6 IMPLANT
CLIP VESOCCLUDE SM WIDE 6/CT (CLIP) ×12 IMPLANT
COVER SURGICAL LIGHT HANDLE (MISCELLANEOUS) ×2 IMPLANT
COVER WAND RF STERILE (DRAPES) ×2 IMPLANT
DERMABOND ADVANCED (GAUZE/BANDAGES/DRESSINGS) ×1
DERMABOND ADVANCED .7 DNX12 (GAUZE/BANDAGES/DRESSINGS) ×1 IMPLANT
DRAPE LAPAROTOMY T 98X78 PEDS (DRAPES) ×2 IMPLANT
ELECT PENCIL ROCKER SW 15FT (MISCELLANEOUS) ×2 IMPLANT
ELECT REM PT RETURN 15FT ADLT (MISCELLANEOUS) ×2 IMPLANT
GAUZE 4X4 16PLY RFD (DISPOSABLE) ×2 IMPLANT
GLOVE SURG ORTHO 8.0 STRL STRW (GLOVE) ×2 IMPLANT
GOWN STRL REUS W/TWL XL LVL3 (GOWN DISPOSABLE) ×4 IMPLANT
HEMOSTAT SURGICEL 2X4 FIBR (HEMOSTASIS) ×2 IMPLANT
ILLUMINATOR WAVEGUIDE N/F (MISCELLANEOUS) ×2 IMPLANT
KIT BASIN (CUSTOM PROCEDURE TRAY) ×2 IMPLANT
KIT TURNOVER KIT A (KITS) IMPLANT
PACK BASIC VI WITH GOWN DISP (CUSTOM PROCEDURE TRAY) ×2 IMPLANT
SHEARS HARMONIC 9CM CVD (BLADE) ×2 IMPLANT
SUT MNCRL AB 4-0 PS2 18 (SUTURE) ×2 IMPLANT
SUT VIC AB 3-0 SH 18 (SUTURE) ×4 IMPLANT
SYR BULB IRRIG 60ML STRL (SYRINGE) ×2 IMPLANT
TOWEL OR 17X26 10 PK STRL BLUE (TOWEL DISPOSABLE) ×2 IMPLANT
TOWEL OR NON WOVEN STRL DISP B (DISPOSABLE) ×2 IMPLANT
TUBING CONNECTING 10 (TUBING) ×2 IMPLANT

## 2019-07-22 NOTE — Transfer of Care (Signed)
Immediate Anesthesia Transfer of Care Note  Patient: Jessica Mcpherson  Procedure(s) Performed: TOTAL THYROIDECTOMY WITH CENTRAL COMPARTMENT LYMPH NODE DISSECTION ZONE VI (N/A )  Patient Location: PACU  Anesthesia Type:General  Level of Consciousness: awake, alert  and oriented  Airway & Oxygen Therapy: Patient Spontanous Breathing and Patient connected to nasal cannula oxygen  Post-op Assessment: Report given to RN and Post -op Vital signs reviewed and stable  Post vital signs: Reviewed and stable  Last Vitals:  Vitals Value Taken Time  BP    Temp    Pulse 109 07/22/19 0947  Resp    SpO2 100 % 07/22/19 0947  Vitals shown include unvalidated device data.  Last Pain:  Vitals:   07/22/19 0600  TempSrc: Oral         Complications: No complications documented.

## 2019-07-22 NOTE — Interval H&P Note (Signed)
History and Physical Interval Note:  07/22/2019 7:15 AM  Jessica Mcpherson  has presented today for surgery, with the diagnosis of PAPILLARY THYROID CARCINOMA.  The various methods of treatment have been discussed with the patient and family. After consideration of risks, benefits and other options for treatment, the patient has consented to    Procedure(s): TOTAL THYROIDECTOMY WITH LIMITED LYMPH NODE DISSECTION (N/A) as a surgical intervention.    The patient's history has been reviewed, patient examined, no change in status, stable for surgery.  I have reviewed the patient's chart and labs.  Questions were answered to the patient's satisfaction.    Armandina Gemma, MD [facility] Office: [place]

## 2019-07-22 NOTE — Anesthesia Procedure Notes (Signed)
Procedure Name: Intubation Date/Time: 07/22/2019 7:40 AM Performed by: Ersilia Brawley D, CRNA Pre-anesthesia Checklist: Patient identified, Emergency Drugs available, Suction available and Patient being monitored Patient Re-evaluated:Patient Re-evaluated prior to induction Oxygen Delivery Method: Circle system utilized Preoxygenation: Pre-oxygenation with 100% oxygen Induction Type: IV induction Ventilation: Mask ventilation without difficulty Laryngoscope Size: Mac and 3 Grade View: Grade I Tube type: Oral Tube size: 7.0 mm Number of attempts: 1 Airway Equipment and Method: Stylet Placement Confirmation: ETT inserted through vocal cords under direct vision,  positive ETCO2 and breath sounds checked- equal and bilateral Secured at: 21 cm Tube secured with: Tape Dental Injury: Teeth and Oropharynx as per pre-operative assessment

## 2019-07-22 NOTE — Op Note (Signed)
Procedure Note  Pre-operative Diagnosis:  Papillary thyroid carcinoma  Post-operative Diagnosis:  same  Surgeon:  Armandina Gemma, MD  Assistant:  none   Procedure:  Total thyroidectomy with limited central compartment lymph node dissection  Anesthesia:  General  Estimated Blood Loss:  minimal  Drains: none         Specimen: thyroid to pathology  Indications:  Patient is referred by Dr. Pricilla Holm for surgical evaluation and management of thyroid nodule with probable papillary thyroid carcinoma. Patient had been evaluated by an urgent care clinic for right shoulder pain. As part of her physical examination, they noted a thyroid nodule. Patient subsequently underwent thyroid ultrasound which showed a normal size thyroid gland with a dominant nodule in the mid left thyroid lobe measuring 3.4 cm in size. This was felt to be highly suspicious based on ultrasound criteria and biopsy was recommended. Subsequent fine-needle aspiration biopsy in early April 2021 demonstrated findings suspicious for papillary thyroid carcinoma. Patient now comes to surgery for thyroidectomy and limited lymph node dissection.  Procedure Details: Procedure was done in OR #1 at the [facility]. The patient was brought to the operating room and placed in a supine position on the operating room table. Following administration of general anesthesia, the patient was positioned and then prepped and draped in the usual aseptic fashion. After ascertaining that an adequate level of anesthesia had been achieved, a small Kocher incision was made with #15 blade. Dissection was carried through subcutaneous tissues and platysma.Hemostasis was achieved with the electrocautery. Skin flaps were elevated cephalad and caudad from the thyroid notch to the sternal notch. A Mahorner self-retaining retractor was placed for exposure. Strap muscles were incised in the midline and dissection was begun on the left side.  Strap  muscles were reflected laterally.  Left thyroid lobe was moderately enlarged, firm, and heterogeneous.  The left lobe was gently mobilized with blunt dissection. Superior pole vessels were dissected out and divided individually between small and medium ligaclips with the harmonic scalpel. The thyroid lobe was rolled anteriorly. Branches of the inferior thyroid artery were divided between small ligaclips with the harmonic scalpel. Inferior venous tributaries were divided between ligaclips. Both the superior and inferior parathyroid glands were identified and preserved on their vascular pedicles. The recurrent laryngeal nerve was identified and preserved along its course. The ligament of Gwenlyn Found was released with the electrocautery and the gland was mobilized onto the anterior trachea. Isthmus was mobilized across the midline. There was no pyramidal lobe present. Dry pack was placed in the left neck.  The right thyroid lobe was gently mobilized with blunt dissection. Right thyroid lobe was normal in size. Superior pole vessels were dissected out and divided between small and medium ligaclips with the Harmonic scalpel. Superior parathyroid was identified and preserved. Inferior venous tributaries were divided between medium ligaclips with the harmonic scalpel. The right thyroid lobe was rolled anteriorly and the branches of the inferior thyroid artery divided between small ligaclips. The right recurrent laryngeal nerve was identified and preserved along its course. The ligament of Gwenlyn Found was released with the electrocautery. The right thyroid lobe was mobilized onto the anterior trachea and the remainder of the thyroid was dissected off the anterior trachea and the thyroid was completely excised. A suture was used to mark the left lobe. The entire thyroid gland was submitted to pathology for review.  Next the central compartment was dissected out using the electrocautery for hemostasis.  Care was taken to avoid the  parathyroid  glands and the recurrent nerves.  Tissue was dissected out between the carotid arteries and inferiorly to the subclavian.  A large portion of the tissue appeared to be thymus.  However there was a firm hard lymph node on the left side of the trachea which was removed and submitted.  Good hemostasis was noted.  The neck was irrigated with warm saline. Fibrillar was placed throughout the operative field. Strap muscles were approximated in the midline with interrupted 3-0 Vicryl sutures. Platysma was closed with interrupted 3-0 Vicryl sutures. Skin was closed with a running 4-0 Monocryl subcuticular suture. Wound was washed and Dermabond was applied. The patient was awakened from anesthesia and brought to the recovery room. The patient tolerated the procedure well.   Armandina Gemma, MD [facility] Office: [phone]

## 2019-07-22 NOTE — Anesthesia Preprocedure Evaluation (Signed)
Anesthesia Evaluation  Patient identified by MRN, date of birth, ID band Patient awake    Reviewed: Allergy & Precautions, H&P , NPO status , Patient's Chart, lab work & pertinent test results  Airway Mallampati: II  TM Distance: >3 FB Neck ROM: full    Dental no notable dental hx. (+) Teeth Intact, Dental Advisory Given   Pulmonary neg pulmonary ROS, Current Smoker and Patient abstained from smoking.,    Pulmonary exam normal breath sounds clear to auscultation       Cardiovascular Exercise Tolerance: Good hypertension, negative cardio ROS Normal cardiovascular exam Rhythm:regular Rate:Normal     Neuro/Psych  Headaches, Anxiety Depression negative psych ROS   GI/Hepatic Neg liver ROS, GERD  ,  Endo/Other  negative endocrine ROS  Renal/GU negative Renal ROS  negative genitourinary   Musculoskeletal   Abdominal   Peds  Hematology negative hematology ROS (+)   Anesthesia Other Findings Thyroid Cancer  Reproductive/Obstetrics negative OB ROS                             Anesthesia Physical  Anesthesia Plan  ASA: III  Anesthesia Plan: General   Post-op Pain Management:    Induction: Intravenous  PONV Risk Score and Plan: 2 and Ondansetron, Midazolam and Treatment may vary due to age or medical condition  Airway Management Planned: Oral ETT  Additional Equipment:   Intra-op Plan:   Post-operative Plan: Extubation in OR  Informed Consent: I have reviewed the patients History and Physical, chart, labs and discussed the procedure including the risks, benefits and alternatives for the proposed anesthesia with the patient or authorized representative who has indicated his/her understanding and acceptance.     Dental Advisory Given  Plan Discussed with: CRNA and Surgeon  Anesthesia Plan Comments:         Anesthesia Quick Evaluation

## 2019-07-22 NOTE — Discharge Instructions (Signed)
[facility]  THYROID & PARATHYROID SURGERY:  POST-OP INSTRUCTIONS  Always review your discharge instruction sheet from the facility where your surgery was performed.  A prescription for pain medication may be given to you upon discharge.  Take your pain medication as prescribed.  If narcotic pain medicine is not needed, then you may take acetaminophen (Tylenol) or ibuprofen (Advil) as needed.  Take your usually prescribed medications unless otherwise directed.  If you need a refill on your pain medication, please contact our office during regular business hours.  Prescriptions cannot be processed by our office after 5 pm or on weekends.  Start with a light diet upon arrival home, such as soup and crackers or toast.  Be sure to drink plenty of fluids daily.  Resume your normal diet the day after surgery.  Most patients will experience some swelling and bruising on the chest and neck area.  Ice packs will help.  Swelling and bruising can take several days to resolve.   It is common to experience some constipation after surgery.  Increasing fluid intake and taking a stool softener (Colace) will usually help or prevent this problem.  A mild laxative (Milk of Magnesia or Miralax) should be taken according to package directions if there has been no bowel movement after 48 hours.  You have steri-strips and a gauze dressing over your incision.  You may remove the gauze bandage on the second day after surgery, and you may shower at that time.  Leave your steri-strips (small skin tapes) in place directly over the incision.  These strips should remain on the skin for 5-7 days and then be removed.  You may get them wet in the shower and pat them dry.  You may resume regular (light) daily activities beginning the next day (such as daily self-care, walking, climbing stairs) gradually increasing activities as tolerated.  You may have sexual intercourse when it is comfortable.  Refrain from  any heavy lifting or straining until approved by your doctor.  You may drive when you no longer are taking prescription pain medication, you can comfortably wear a seatbelt, and you can safely maneuver your car and apply brakes.  You should see your doctor in the office for a follow-up appointment approximately three weeks after your surgery.  Make sure that you call for this appointment within a day or two after you arrive home to insure a convenient appointment time.  WHEN TO CALL YOUR DOCTOR: -- Fever greater than 101.5 -- Inability to urinate -- Nausea and/or vomiting - persistent -- Extreme swelling or bruising -- Continued bleeding from incision -- Increased pain, redness, or drainage from the incision -- Difficulty swallowing or breathing -- Muscle cramping or spasms -- Numbness or tingling in hands or around lips  The clinic staff is available to answer your questions during regular business hours.  Please don't hesitate to call and ask to speak to one of the nurses if you have concerns.  Jessica Bouley, MD [facility] Office: [phone] 

## 2019-07-22 NOTE — Anesthesia Postprocedure Evaluation (Signed)
Anesthesia Post Note  Patient: Jessica Mcpherson  Procedure(s) Performed: TOTAL THYROIDECTOMY WITH CENTRAL COMPARTMENT LYMPH NODE DISSECTION ZONE VI (N/A )     Patient location during evaluation: PACU Anesthesia Type: General Level of consciousness: awake and alert Pain management: pain level controlled Vital Signs Assessment: post-procedure vital signs reviewed and stable Respiratory status: spontaneous breathing, nonlabored ventilation and respiratory function stable Cardiovascular status: blood pressure returned to baseline and stable Postop Assessment: no apparent nausea or vomiting Anesthetic complications: no   No complications documented.  Last Vitals:  Vitals:   07/22/19 1045 07/22/19 1100  BP: 125/68 124/76  Pulse: 65 65  Resp: 10 12  Temp:  (!) 36.4 C  SpO2: 100% 100%    Last Pain:  Vitals:   07/22/19 1045  TempSrc:   PainSc: [place]

## 2019-07-23 ENCOUNTER — Encounter (HOSPITAL_COMMUNITY): Payer: Self-pay | Admitting: Surgery

## 2019-07-23 DIAGNOSIS — C73 Malignant neoplasm of thyroid gland: Secondary | ICD-10-CM | POA: Diagnosis not present

## 2019-07-23 LAB — BASIC METABOLIC PANEL
Anion gap: 6 (ref 5–15)
BUN: 9 mg/dL (ref 6–20)
CO2: 25 mmol/L (ref 22–32)
Calcium: 9.2 mg/dL (ref 8.9–10.3)
Chloride: 102 mmol/L (ref 98–111)
Creatinine, Ser: 0.75 mg/dL (ref 0.44–1.00)
GFR calc Af Amer: >60 mL/min (ref >60)
GFR calc non Af Amer: >60 mL/min (ref >60)
Glucose, Bld: 122 mg/dL — ABNORMAL HIGH (ref 70–99)
Potassium: 4.3 mmol/L (ref 3.5–5.1)
Sodium: 133 mmol/L — ABNORMAL LOW (ref 135–145)

## 2019-07-23 MED ORDER — LEVOTHYROXINE SODIUM 100 MCG PO TABS: 100.0000 ug | ORAL_TABLET | Freq: Every day | ORAL | 11 refills | Status: DC

## 2019-07-23 MED ORDER — OXYCODONE HCL 5 MG PO TABS: 5.0000 mg | ORAL_TABLET | ORAL | 0 refills | Status: DC | PRN

## 2019-07-23 MED ORDER — CALCIUM CARBONATE ANTACID 500 MG PO CHEW
2.0000 | CHEWABLE_TABLET | Freq: Three times a day (TID) | ORAL | 1 refills | Status: DC
Start: 2019-07-23 — End: 2019-09-27

## 2019-07-23 NOTE — Progress Notes (Signed)
Patient ID: Jessica Mcpherson, female   DOB: 15-Aug-1985, 34 y.o.   MRN: [id number]   Comfortable this morning Voice is normal Neck incision with minimal swelling Calcium is normal  Plan: Discharged home

## 2019-07-23 NOTE — Discharge Summary (Signed)
Physician Discharge Summary  Patient ID: Jessica Mcpherson MRN: [id number] DOB/AGE: 20-Jun-1985 34 y.o.  Admit date: 07/22/2019 Discharge date: 07/23/2019  Admission Diagnoses:  Discharge Diagnoses:  Principal Problem:   Papillary thyroid carcinoma [facility])   Discharged Condition: good  Hospital Course: Uneventful postoperative recovery.  Discharged home postoperative day 1 doing well with normal calcium  Consults: None  Significant Diagnostic Studies:   Treatments: surgery: Total thyroidectomy  Discharge Exam: Blood pressure 127/88, pulse 68, temperature 97.9 F (36.6 C), temperature source Oral, resp. rate 18, height 5' (1.524 m), weight 81.6 kg, last menstrual period 07/10/2019, SpO2 99 %. General appearance: alert, cooperative and no distress Incision/Wound:neck incision with minimal swelling, no hematoma, Voice normal  Disposition: Discharge disposition: 01-Home or Self Care        Allergies as of 07/23/2019      Reactions   Penicillins Hives, Other (See Comments)   CHILDHOOD ALLERGY Has patient had a PCN reaction causing immediate rash, facial/tongue/throat swelling, SOB or lightheadedness with hypotension: YES Has patient had a PCN reaction causing severe rash involving mucus membranes or skin necrosis: UNKNOWN Has patient had a PCN reaction that required hospitalization: YES Has patient had a PCN reaction occurring within the last 10 years: No If all of the above answers are "NO", then may proceed with Cephalosporin use.   Pepperoni [pickled Meat] Hives      Medication List    TAKE these medications   calcium carbonate 500 MG chewable tablet Commonly known as: Tums Chew 2 tablets (400 mg of elemental calcium total) by mouth 3 (three) times daily.   hydrochlorothiazide 25 MG tablet Commonly known as: HYDRODIURIL Take 25 mg by mouth daily.   levothyroxine 100 MCG tablet Commonly known as: Synthroid Take 1 tablet (100 mcg total) by mouth daily.    multivitamin with minerals Tabs tablet Take 1 tablet by mouth daily.   oxyCODONE 5 MG immediate release tablet Commonly known as: Oxy IR/ROXICODONE Take 1-2 tablets (5-10 mg total) by mouth every 4 (four) hours as needed for moderate pain.   traZODone 50 MG tablet Commonly known as: DESYREL Take 50 mg by mouth at bedtime as needed for sleep.   Vitamin D (Ergocalciferol) 1.25 MG (50000 UNIT) Caps capsule Commonly known as: DRISDOL Take 1 capsule (50,000 Units total) by mouth every 7 (seven) days.       Follow-up Information    Armandina Gemma, MD. Schedule an appointment as soon as possible for a visit in 2 week(s).   Specialty: General Surgery Contact information: [address] [place] [address] [phone]               Signed: Coralie Keens 07/23/2019, 9:47 AM

## 2019-07-26 ENCOUNTER — Encounter: Payer: Self-pay | Admitting: Internal Medicine

## 2019-07-27 ENCOUNTER — Emergency Department (HOSPITAL_COMMUNITY): Payer: 59

## 2019-07-27 ENCOUNTER — Encounter (HOSPITAL_COMMUNITY): Payer: Self-pay

## 2019-07-27 ENCOUNTER — Emergency Department (HOSPITAL_COMMUNITY)
Admission: EM | Admit: 2019-07-27 | Discharge: 2019-07-27 | Disposition: A | Discharge status: Home / Self Care | Payer: 59 | Attending: Emergency Medicine | Admitting: Emergency Medicine

## 2019-07-27 DIAGNOSIS — Z9089 Acquired absence of other organs: Secondary | ICD-10-CM | POA: Diagnosis not present

## 2019-07-27 DIAGNOSIS — F17210 Nicotine dependence, cigarettes, uncomplicated: Secondary | ICD-10-CM | POA: Diagnosis not present

## 2019-07-27 DIAGNOSIS — I10 Essential (primary) hypertension: Secondary | ICD-10-CM | POA: Insufficient documentation

## 2019-07-27 DIAGNOSIS — E890 Postprocedural hypothyroidism: Secondary | ICD-10-CM

## 2019-07-27 DIAGNOSIS — Z79899 Other long term (current) drug therapy: Secondary | ICD-10-CM | POA: Diagnosis not present

## 2019-07-27 DIAGNOSIS — Z9009 Acquired absence of other part of head and neck: Secondary | ICD-10-CM

## 2019-07-27 DIAGNOSIS — Z85850 Personal history of malignant neoplasm of thyroid: Secondary | ICD-10-CM | POA: Insufficient documentation

## 2019-07-27 DIAGNOSIS — R0602 Shortness of breath: Secondary | ICD-10-CM | POA: Insufficient documentation

## 2019-07-27 DIAGNOSIS — M542 Cervicalgia: Secondary | ICD-10-CM | POA: Diagnosis not present

## 2019-07-27 LAB — I-STAT BETA HCG BLOOD, ED (MC, WL, AP ONLY): I-stat hCG, quantitative: <5 m[IU]/mL (ref <5)

## 2019-07-27 LAB — BASIC METABOLIC PANEL WITH GFR
Anion gap: 11 (ref 5–15)
BUN: 14 mg/dL (ref 6–20)
CO2: 25 mmol/L (ref 22–32)
Calcium: 9.1 mg/dL (ref 8.9–10.3)
Chloride: 103 mmol/L (ref 98–111)
Creatinine, Ser: 0.86 mg/dL (ref 0.44–1.00)
GFR calc Af Amer: >60 mL/min (ref >60)
GFR calc non Af Amer: >60 mL/min (ref >60)
Glucose, Bld: 100 mg/dL — ABNORMAL HIGH (ref 70–99)
Potassium: 4.3 mmol/L (ref 3.5–5.1)
Sodium: 139 mmol/L (ref 135–145)

## 2019-07-27 LAB — CBC WITH DIFFERENTIAL/PLATELET
Abs Immature Granulocytes: 0.03 K/uL (ref 0.00–0.07)
Basophils Absolute: 0 K/uL (ref 0.0–0.1)
Basophils Relative: 0 %
Eosinophils Absolute: 0.1 K/uL (ref 0.0–0.5)
Eosinophils Relative: 1 %
HCT: 43 % (ref 36.0–46.0)
Hemoglobin: 14.3 g/dL (ref 12.0–15.0)
Immature Granulocytes: 0 %
Lymphocytes Relative: 26 %
Lymphs Abs: 2.9 K/uL (ref 0.7–4.0)
MCH: 29.9 pg (ref 26.0–34.0)
MCHC: 33.3 g/dL (ref 30.0–36.0)
MCV: 90 fL (ref 80.0–100.0)
Monocytes Absolute: 0.9 K/uL (ref 0.1–1.0)
Monocytes Relative: 8 %
Neutro Abs: 7.4 K/uL (ref 1.7–7.7)
Neutrophils Relative %: 65 %
Platelets: 329 K/uL (ref 150–400)
RBC: 4.78 MIL/uL (ref 3.87–5.11)
RDW: 13.3 % (ref 11.5–15.5)
WBC: 11.3 K/uL — ABNORMAL HIGH (ref 4.0–10.5)
nRBC: 0 % (ref 0.0–0.2)

## 2019-07-27 MED ORDER — IOHEXOL 300 MG/ML  SOLN
75.0000 mL | Freq: Once | INTRAMUSCULAR | Status: AC | PRN
  Administered 2019-07-27: 75 mL via INTRAVENOUS

## 2019-07-27 MED ORDER — KETOROLAC TROMETHAMINE 30 MG/ML IJ SOLN
30.0000 mg | Freq: Once | INTRAMUSCULAR | Status: AC
  Administered 2019-07-27: 30 mg via INTRAVENOUS
  Filled 2019-07-27: qty 1

## 2019-07-27 NOTE — ED Triage Notes (Signed)
Pt states she had her thyroid removed on Friday. Starting yesterday she started feeling like "someone was choking me." Pt states she feels like she is having trouble catching her breath. Since yesterday the feeling has worsened and pt states she cannot get up and walk around due to the [facility]. Pt is A&Ox4.

## 2019-07-27 NOTE — Discharge Instructions (Addendum)
Follow up at Dr. Gala Lewandowsky office tomorrow. Continue taking Tylenol and ibuprofen to help with pain and swelling. Your CT scan did not show any evidence of obstruction of your airway or infection. Return to the ER for worsening pain, swelling, trouble breathing or chest pain, fever.

## 2019-07-27 NOTE — ED Provider Notes (Signed)
[facility] DEPT Provider Note   CSN: [id number] Arrival date & time: 07/27/19  1816     History Chief Complaint  Patient presents with  . Shortness of Breath    Jessica Mcpherson is a 34 y.o. female with a past medical history of hypertension, papillary thyroid carcinoma status post thyroidectomy on 07/23/2019 presenting to the ED with a chief complaint of shortness of breath.  Reports swelling at the site of her incision ever since the day after her surgery.  She feels that this is progressively getting worse to the point where it feels like "someone is choking me."  She reports shortness of breath associated with this but denies any chest pain.  She called her surgeon yesterday and was told that it could be due to swelling and was told to take NSAIDs.  She has been taking this without significant improvement.  She denies any leg swelling, cough, lip or tongue swelling, vomiting, abdominal pain or fever.  HPI     Past Medical History:  Diagnosis Date  . Anxiety   . Arthritis    Right knee  . Depression   . GERD (gastroesophageal reflux disease)   . Headache   . Hypertension   . Miscarriage    x2  . Papillary thyroid carcinoma [facility])     Patient Active Problem List   Diagnosis Date Noted  . Other fatigue 06/18/2019  . Papillary thyroid carcinoma ([place]) 05/03/2019  . Essential hypertension 05/03/2019  . Tobacco abuse 05/03/2019  . Depression 05/03/2019    Past Surgical History:  Procedure Laterality Date  . CESAREAN SECTION    . LAPAROSCOPIC APPENDECTOMY N/A 12/30/2013   Procedure: APPENDECTOMY LAPAROSCOPIC;  Surgeon: Jackolyn Confer, MD;  Location: [facility] ORS;  Service: General;  Laterality: N/A;  . THYROIDECTOMY N/A 07/22/2019   Procedure: TOTAL THYROIDECTOMY WITH CENTRAL COMPARTMENT LYMPH NODE DISSECTION ZONE VI;  Surgeon: Armandina Gemma, MD;  Location: [facility] ORS;  Service: General;  Laterality: N/A;     OB History    Gravida  5   Para  1    Term  1   Preterm      AB  2   Living  1     SAB  1   TAB      Ectopic      Multiple      Live Births  1           Family History  Problem Relation Age of Onset  . Diabetes Mother   . Hypertension Mother   . Asthma Mother   . Diabetes Maternal Grandmother   . Hypertension Maternal Grandmother   . Arthritis Maternal Grandmother   . Heart disease Maternal Grandmother   . Diabetes Maternal Grandfather   . Arthritis Maternal Grandfather   . Heart disease Maternal Grandfather   . Hypertension Father     Social History   Tobacco Use  . Smoking status: Current Every Day Smoker    Packs/day: 0.10    Types: Cigarettes  . Smokeless tobacco: Never Used  Vaping Use  . Vaping Use: Never used  Substance Use Topics  . Alcohol use: No    Alcohol/week: 0.0 standard drinks  . Drug use: No    Home Medications Prior to Admission medications   Medication Sig Start Date End Date Taking? Authorizing Provider  acetaminophen (TYLENOL) 500 MG tablet Take 1,000 mg by mouth every 6 (six) hours as needed for mild pain.   Yes [provider]  calcium carbonate (TUMS) 500 MG chewable tablet Chew 2 tablets (400 mg of elemental calcium total) by mouth 3 (three) times daily. 07/23/19  Yes Coralie Keens, MD  hydrochlorothiazide (HYDRODIURIL) 25 MG tablet Take 25 mg by mouth daily.   Yes [provider]  ibuprofen (ADVIL) 200 MG tablet Take 200 mg by mouth every 6 (six) hours as needed for moderate pain.   Yes [provider]  levothyroxine (SYNTHROID) 100 MCG tablet Take 1 tablet (100 mcg total) by mouth daily. 07/23/19 07/22/20 Yes Coralie Keens, MD  Multiple Vitamin (MULTIVITAMIN WITH MINERALS) TABS tablet Take 1 tablet by mouth daily.   Yes [provider]  oxyCODONE (OXY IR/ROXICODONE) 5 MG immediate release tablet Take 1-2 tablets (5-10 mg total) by mouth every 4 (four) hours as needed for moderate pain. 07/23/19  Yes Coralie Keens, MD   traZODone (DESYREL) 50 MG tablet Take 50 mg by mouth at bedtime as needed for sleep.    Yes [provider]  Vitamin D, Ergocalciferol, (DRISDOL) 1.25 MG (50000 UNIT) CAPS capsule Take 1 capsule (50,000 Units total) by mouth every 7 (seven) days. 06/20/19  Yes Hoyt Koch, MD    Allergies    Penicillins and Pepperoni [pickled meat]  Review of Systems   Review of Systems  Constitutional: Negative for appetite change, chills and fever.  HENT: Negative for ear pain, rhinorrhea, sneezing and sore throat.   Eyes: Negative for photophobia and visual disturbance.  Respiratory: Negative for cough, chest tightness, shortness of breath and wheezing.   Cardiovascular: Negative for chest pain and palpitations.  Gastrointestinal: Negative for abdominal pain, blood in stool, constipation, diarrhea, nausea and vomiting.  Genitourinary: Negative for dysuria, hematuria and urgency.  Musculoskeletal: Positive for neck pain. Negative for myalgias.  Skin: Negative for rash.  Neurological: Negative for dizziness, weakness and light-headedness.    Physical Exam Updated Vital Signs BP (!) 128/92   Pulse 68   Temp 98.4 F (36.9 C)   Resp 16   LMP 07/10/2019 (Exact Date)   SpO2 96%   Physical Exam Vitals and nursing note reviewed.  Constitutional:      General: She is not in acute distress.    Appearance: She is well-developed.     Comments: Speaking in complete sentences without difficulty. No stridor. No signs of respiratory distress.  HENT:     Head: Normocephalic and atraumatic.     Nose: Nose normal.  Eyes:     General: No scleral icterus.       Left eye: No discharge.     Conjunctiva/sclera: Conjunctivae normal.  Cardiovascular:     Rate and Rhythm: Normal rate and regular rhythm.     Heart sounds: Normal heart sounds. No murmur heard.  No friction rub. No gallop.   Pulmonary:     Effort: Pulmonary effort is normal. No respiratory distress.     Breath sounds: Normal  breath sounds.  Abdominal:     General: Bowel sounds are normal. There is no distension.     Palpations: Abdomen is soft.     Tenderness: There is no abdominal tenderness. There is no guarding.  Musculoskeletal:        General: Normal range of motion.     Cervical back: Normal range of motion and neck supple.  Skin:    General: Skin is warm and dry.     Findings: Wound present. No rash.     Comments: Horizontal incision on the anterior neck that appears to be healing.  No drainage noted. Area is soft. No significant tenderness.  Neurological:     Mental Status: She is alert.     Motor: No abnormal muscle tone.     Coordination: Coordination normal.     ED Results / Procedures / Treatments   Labs (all labs ordered are listed, but only abnormal results are displayed) Labs Reviewed  BASIC METABOLIC PANEL - Abnormal; Notable for the following components:      Result Value   Glucose, Bld 100 (*)    All other components within normal limits  CBC WITH DIFFERENTIAL/PLATELET - Abnormal; Notable for the following components:   WBC 11.3 (*)    All other components within normal limits  I-STAT BETA HCG BLOOD, ED ([facility], [facility], [facility] ONLY)    EKG None  Radiology DG Chest 2 View  Result Date: 07/27/2019 CLINICAL DATA:  Shortness of breath. EXAM: CHEST - 2 VIEW COMPARISON:  07/11/2019 FINDINGS: Lower lung volumes from prior.The cardiomediastinal contours are normal. The lungs are clear. Pulmonary vasculature is normal. No consolidation, pleural effusion, or pneumothorax. No acute osseous abnormalities are seen. IMPRESSION: Low lung volumes without acute chest finding. Electronically Signed   By: Keith Rake M.D.   On: 07/27/2019 19:22   CT Soft Tissue Neck W Contrast  Result Date: 07/27/2019 CLINICAL DATA:  Postop thyroidectomy, pain and swelling. Additional history obtained from [facility] [place] reportedly underwent thyroidectomy on Friday. EXAM: CT NECK WITH CONTRAST  TECHNIQUE: Multidetector CT imaging of the neck was performed using the standard protocol following the bolus administration of intravenous contrast. CONTRAST:  20mL OMNIPAQUE IOHEXOL 300 MG/ML  SOLN COMPARISON:  No pertinent prior studies available for comparison. FINDINGS: Pharynx and larynx: No appreciable swelling or discrete mass within the oral cavity, pharynx or larynx. No appreciable retropharyngeal fluid collection. Salivary glands: No inflammation, mass, or stone. Thyroid: Postoperative changes from recent thyroidectomy. This includes multiple surgical clips in the thyroidectomy bed. Additionally, there is low-attenuation within the expected location of the thyroid gland consistent with postoperative edema or seroma. There is no peripheral enhancement to suggest abscess. There also a few small foci of postoperative gas within the thyroidectomy bed. There is no significant airway narrowing at this level. Lymph nodes: No pathologically enlarged cervical chain lymph nodes. Vascular: The major vascular structures of the neck are patent. Limited intracranial: No abnormality identified. Visualized orbits: Visualized orbits show no acute finding. Mastoids and visualized paranasal sinuses: No significant paranasal sinus disease or mastoid effusion at the imaged levels. Skeleton: No acute bony abnormality or aggressive osseous lesion. Nonspecific reversal of the expected cervical lordosis. Upper chest: No consolidation within the imaged lung apices. IMPRESSION: Postoperative sequela from recent thyroidectomy. Low attenuation within the expected location of the thyroid gland consistent with postoperative edema or seroma. There is no peripheral enhancement to suggest abscess at this site. A few small foci of postoperative gas are also present within the thyroidectomy bed. No significant airway narrowing. Nonspecific reversal of the expected cervical lordosis. Electronically Signed   By: Kellie Simmering DO   On:  07/27/2019 21:32    Procedures Procedures (including critical care time)  Medications Ordered in ED Medications  ketorolac (TORADOL) 30 MG/ML injection 30 mg (has no administration in time range)  iohexol (OMNIPAQUE) 300 MG/ML solution 75 mL (75 mLs Intravenous Contrast Given 07/27/19 2057)    ED Course  I have reviewed the triage vital signs and the nursing notes.  Pertinent labs & imaging results that were available during my care  of the patient were reviewed by me and considered in my medical decision making (see chart for details).  Clinical Course as of Jul 26 2221  Wed Jul 27, 2019  2052 CT tech called me, stated that the mom was concerned that the patient had an allergic reaction to IV contrast dye at the age of 70.  I reviewed the chart, patient had a CT of her abdomen pelvis with IV contrast in 2015 when she was diagnosed with appendicitis.  I also reviewed the ED note from that visit that did not mention any premedication or known IV contrast dye allergy.  I told this to the patient and mom who are agreeable to contrast dye as mother is unsure if the childhood reaction was actually the patient or the patient's sister.  Patient does not recall the CT scan in 2015 as she states "I was so out of it and I was in pain so I don't remember."  I offered premedication prior to CT scan but they are agreeable to proceeding with administration of contrast dye today.   [HK]    Clinical Course User Index [HK] Delia Heady, PA-C   MDM Rules/Calculators/A&P                          34 year old female with a past medical history of hypertension, papillary thyroid carcinoma status post thyroidectomy on 07/23/2019 presents to the ED with a chief complaint of shortness of breath.  Reports feeling swelling at the site of her incision and in her neck.  She feels as though she is being choked.  She reports shortness of breath associated with this but denies any chest pain or fevers.  On exam there is a  horizontal incision on the anterior neck that appears to be healing well.  No significant tenderness surrounding. She has no stridor or signs of respiratory distress.  She is afebrile without recent use of antipyretics.  Work-up here significant for unremarkable lab work including CBC, BMP and negative hCG.  Chest x-ray is unremarkable.  CT soft tissue of the neck shows postop seroma versus edema with some areas of gas.  I spoke to general surgery Dr. Marlou Starks who I informed CT findings.  States that the postoperative gas is most likely a substance that they use during the surgery.  He states that as long as she is in no respiratory distress, these are expected postop findings.  He recommends that she follow-up with Dr. Harlow Asa tomorrow.  In the meantime I will give her a dose of Toradol to help with inflammation and pain.  She is agreeable to follow-up. She remains in no acute distress.   Patient is hemodynamically stable, in NAD, and able to ambulate in the ED. Evaluation does not show pathology that would require ongoing emergent intervention or inpatient treatment. I explained the diagnosis to the patient. Pain has been managed and has no complaints prior to discharge. Patient is comfortable with above plan and is stable for discharge at this time. All questions were answered prior to disposition. Strict return precautions for returning to the ED were discussed. Encouraged follow up with PCP.   An After Visit Summary was printed and given to the patient.   Portions of this note were generated with [employer]. Dictation errors may occur despite best attempts at proofreading.  Final Clinical Impression(s) / ED Diagnoses Final diagnoses:  H/O thyroidectomy    Rx / DC Orders ED Discharge Orders  None       Delia Heady, Hershal Coria 07/27/19 2223    Lacretia Leigh, MD 07/28/19 [phone]

## 2019-07-28 LAB — SURGICAL PATHOLOGY

## 2019-07-29 ENCOUNTER — Other Ambulatory Visit: Payer: Self-pay | Admitting: Internal Medicine

## 2019-07-29 DIAGNOSIS — C73 Malignant neoplasm of thyroid gland: Secondary | ICD-10-CM

## 2019-08-01 ENCOUNTER — Encounter (HOSPITAL_COMMUNITY): Payer: Self-pay

## 2019-08-01 ENCOUNTER — Encounter: Payer: Self-pay | Admitting: Internal Medicine

## 2019-08-01 ENCOUNTER — Other Ambulatory Visit: Payer: Self-pay

## 2019-08-01 ENCOUNTER — Emergency Department (HOSPITAL_COMMUNITY)
Admission: EM | Admit: 2019-08-01 | Discharge: 2019-08-01 | Disposition: A | Discharge status: Home / Self Care | Payer: 59 | Attending: Emergency Medicine | Admitting: Emergency Medicine

## 2019-08-01 ENCOUNTER — Emergency Department (HOSPITAL_COMMUNITY): Payer: 59

## 2019-08-01 DIAGNOSIS — Z5321 Procedure and treatment not carried out due to patient leaving prior to being seen by health care provider: Secondary | ICD-10-CM | POA: Insufficient documentation

## 2019-08-01 DIAGNOSIS — R0602 Shortness of breath: Secondary | ICD-10-CM | POA: Diagnosis not present

## 2019-08-01 MED ORDER — ALBUTEROL SULFATE HFA 108 (90 BASE) MCG/ACT IN AERS
4.0000 | INHALATION_SPRAY | Freq: Once | RESPIRATORY_TRACT | Status: DC
  Filled 2019-08-01: qty 6.7

## 2019-08-01 MED ORDER — PREDNISONE 20 MG PO TABS
60.0000 mg | ORAL_TABLET | Freq: Once | ORAL | Status: DC
  Filled 2019-08-01: qty 3

## 2019-08-01 NOTE — ED Notes (Signed)
Patient states she does not want any medication that she felt like she just wants to be monitored for a while.

## 2019-08-01 NOTE — ED Triage Notes (Signed)
Patient arrived stating she had her tyroid removed last Friday and tonight she took a Tramadol and smoked marijuana. Afterwards at 1am patient began having shortness of breath and states her heart was racing.

## 2019-08-04 ENCOUNTER — Ambulatory Visit: Payer: 59 | Admitting: Internal Medicine

## 2019-08-29 ENCOUNTER — Encounter: Payer: Self-pay | Admitting: Internal Medicine

## 2019-08-29 ENCOUNTER — Telehealth (INDEPENDENT_AMBULATORY_CARE_PROVIDER_SITE_OTHER): Payer: 59 | Admitting: Internal Medicine

## 2019-08-29 DIAGNOSIS — C73 Malignant neoplasm of thyroid gland: Secondary | ICD-10-CM

## 2019-08-29 DIAGNOSIS — F331 Major depressive disorder, recurrent, moderate: Secondary | ICD-10-CM | POA: Diagnosis not present

## 2019-08-29 MED ORDER — BUSPIRONE HCL 5 MG PO TABS: 5.0000 mg | ORAL_TABLET | Freq: Two times a day (BID) | ORAL | 0 refills | Status: DC

## 2019-08-29 MED ORDER — TRIAMCINOLONE ACETONIDE 0.1 % EX CREA
1.0000 | TOPICAL_CREAM | Freq: Two times a day (BID) | CUTANEOUS | 0 refills | Status: DC
Start: 2019-08-29 — End: 2019-12-26

## 2019-08-29 NOTE — Assessment & Plan Note (Signed)
S/P surgery and awaiting radiation treatment in September, on levothyroxine 100 mcg daily. Rx triamcinolone ointment for dry skin on stomach.

## 2019-08-29 NOTE — Assessment & Plan Note (Signed)
Declines daily medication at this time. Rx buspar for panic episodes for BID prn. Okay with work restrictions to 4 hour day, indoor duty, no work related driving until after radiation treatment in September. If going back to work does not help she will consider daily option to help mood.

## 2019-08-29 NOTE — Progress Notes (Signed)
Virtual Visit via Video Note  I connected with Jessica Mcpherson on 08/29/19 at 11:00 AM EDT by a video enabled telemedicine application and verified that I am speaking with the correct person using two identifiers.  The patient and the provider were at separate locations throughout the entire encounter. Patient location: home, Provider location: work   I discussed the limitations of evaluation and management by telemedicine and the availability of in person appointments. The patient expressed understanding and agreed to proceed. The patient and the provider were the only parties present for the visit unless noted in HPI below.  History of Present Illness: The patient is a 34 y.o. female with visit for anxiety and panic episodes. Having panic attacks weekly in the last few months. Surgery on thyroid cancer back in July and did have positive margins so getting radiation in September. She has not returned to work yet but feels this might help her depression and anxiety. Panic episodes once a week and did go to ER for them once. Has been struggling with mood for some time over cancer diagnosis and some fatigue issues which may have been related to thyroid. She denies SI/HI. Wants to see about going back to work 4 hour days, no driving and indoor duty light duty only. She felt that this helped previously and she is otherwise not leaving the house much.  Observations/Objective: Appearance: normal, breathing appears normal, casual grooming, abdomen does not appear distended, throat normal, memory normal, mental status is A and O times 3  Assessment and Plan: See problem oriented charting  Follow Up Instructions: rx buspar, return to work note done (forms can be filled out by another provider for 4 hour work day and indoor duty only with no work driving until after radiation happening in September for thyroid cancer), rx triamcinolone ointment for stomach.  I discussed the assessment and treatment plan with  the patient. The patient was provided an opportunity to ask questions and all were answered. The patient agreed with the plan and demonstrated an understanding of the instructions.   The patient was advised to call back or seek an in-person evaluation if the symptoms worsen or if the condition fails to improve as anticipated.  Hoyt Koch, MD

## 2019-09-08 ENCOUNTER — Encounter: Payer: Self-pay | Admitting: Internal Medicine

## 2019-09-27 ENCOUNTER — Encounter: Payer: Self-pay | Admitting: Internal Medicine

## 2019-09-27 ENCOUNTER — Ambulatory Visit (INDEPENDENT_AMBULATORY_CARE_PROVIDER_SITE_OTHER): Payer: 59 | Admitting: Internal Medicine

## 2019-09-27 ENCOUNTER — Other Ambulatory Visit: Payer: Self-pay

## 2019-09-27 VITALS — BP 126/74 | HR 90 | Ht 60.0 in | Wt 186.0 lb

## 2019-09-27 DIAGNOSIS — E890 Postprocedural hypothyroidism: Secondary | ICD-10-CM | POA: Diagnosis not present

## 2019-09-27 DIAGNOSIS — C73 Malignant neoplasm of thyroid gland: Secondary | ICD-10-CM | POA: Diagnosis not present

## 2019-09-27 LAB — T4, FREE: Free T4: 0.93 ng/dL (ref 0.60–1.60)

## 2019-09-27 LAB — TSH: TSH: 1.24 u[IU]/mL (ref 0.35–4.50)

## 2019-09-27 MED ORDER — LEVOTHYROXINE SODIUM 112 MCG PO TABS: 112.0000 ug | ORAL_TABLET | Freq: Every day | ORAL | 3 refills | Status: DC

## 2019-09-27 NOTE — Progress Notes (Addendum)
Patient ID: Jessica Mcpherson, female   DOB: 29-Nov-1985, 34 y.o.   MRN: [id number]   This visit occurred during the SARS-CoV-2 public health emergency.  Safety protocols were in place, including screening questions prior to the visit, additional usage of staff PPE, and extensive cleaning of exam room while observing appropriate contact time as indicated for disinfecting solutions.   HPI  Jessica Mcpherson is a 34 y.o.-year-old female, referred by her PCP, Dr. Sharlet Salina, for management of thyroid cancer and postsurgical hypothyroidism.  She is here with her mother who offers part of the history, especially about patient's symptoms, timeline, and social environment.  Pt. has been dx with thyroid cancer in 03/2019.   Review thyroid cancer history:  03/2019: Presented to UC for shoulder pain >> neck fullness on palpation  03/24/2019: Thyroid ultrasound: 3.4 x 1.5 x 2.1 cm solid left mid hypoechoic thyroid nodule with microcalcifications.  04/14/2019: FNA of the nodule: Suspicious for PTC  07/22/2019: Total thyroidectomy with central (zone 6) lymph node dissection:  A. THYROID GLAND, TOTAL, THYROIDECTOMY:  - Papillary thyroid carcinoma, multifocal, 2.4 cm in greatest dimension,  with angioinvasion, extrathyroidal extension and margin involvement.  - See oncology table.  B. LYMPH NODES, CENTRAL COMPARTMENT/ZONE VI, REGIONAL RESECTION:  - Metastatic carcinoma in (2) of (2) lymph nodes with extranodal  extension.  - Thymic tissue with focal involvement of peri-thymic connective tissue  by carcinoma.   ONCOLOGY TABLE:  THYROID GLAND:  Procedure: Total thyroidectomy  Tumor Focality: Multifocal  Tumor Site: Left lobe, right lobe  Tumor Size: Greatest dimension: 2.4 cm  Histologic Type: Papillary carcinoma  Margins: Positive for carcinoma in area of extrathyroidal extension (left lobe)  Angioinvasion: Present  Lymphatic Invasion: Present  Extrathyroidal Extension: Present  Regional Lymph Nodes:     Number of Lymph Nodes Involved: 2    Nodal Levels Involved: Central compartment/Zone VI    Size of Largest Metastatic Deposit: 1 cm    Extranodal Extension (ENE): Present    Number of Lymph Nodes Examined: 2    Nodal Levels Examined: Central compartment/Zone VI  Pathologic Stage Classification (pTNM, AJCC 8th Edition): mpT3, pN1a  Representative Tumor Block: A2  Comment: Case discussed with Dr. Armandina Gemma on 07/28/2019. Dr. Saralyn Pilar reviewed select slides.   07/27/2019: CT neck obtained for swelling and pain: edema at thyroid site, no abscess.  Pt denies: - feeling nodules in neck - hoarseness - dysphagia - choking - SOB with lying down  Calcium was normal after surgery: Lab Results  Component Value Date   CALCIUM 9.1 07/27/2019   CALCIUM 9.2 07/23/2019   CALCIUM 9.2 06/17/2019   CALCIUM 9.0 10/12/2018   CALCIUM 9.2 07/19/2018   CALCIUM 8.9 11/13/2017   CALCIUM 8.8 (L) 01/24/2017   CALCIUM 9.2 06/13/2016   CALCIUM 8.9 10/31/2014   CALCIUM 9.2 12/30/2013  She was on calcium supplements postop, now off.  For her postsurgical hypothyroidism, she is on Levothyroxine 100 mcg, taken: - fasting - with water - separated by >30 min from b'fast  - no calcium, iron, PPIs - multivitamins 4h later - no Biotin  I reviewed pt's thyroid tests: Lab Results  Component Value Date   TSH 0.86 06/17/2019   TSH 0.45 05/23/2015   FREET4 0.72 06/17/2019  04/12/2019: TSH 3.194 01/04/2019: TSH 1.11  Pt describes: - + fatigue - + weight gain - + hot and cold intolerance - no constipation - + dry skin - no hair loss - + worse depression, + panic attacks  She has + FH of thyroid disorders in: MGM - thyroid nodule. No FH of thyroid cancer.  No h/o radiation tx to head or neck.  She also has a history of HTN.  ROS: Constitutional: + See HPI Eyes: no blurry vision, no xerophthalmia ENT: no sore throat, + see HPI Cardiovascular: no CP/SOB/palpitations/leg  swelling Respiratory: no cough/SOB Gastrointestinal: no N/V/D/C/+ acid reflux Musculoskeletal: no muscle/joint aches Skin: no rashes, + dry skin Neurological: + tremors/+ numbness in hands-positional/no tingling/dizziness, + HAs Psychiatric: + Both depression/anxiety + Irregular menstrual cycles  Past Medical History:  Diagnosis Date  . Anxiety   . Arthritis    Right knee  . Depression   . GERD (gastroesophageal reflux disease)   . Headache   . Hypertension   . Miscarriage    x2  . Papillary thyroid carcinoma [facility])    Past Surgical History:  Procedure Laterality Date  . CESAREAN SECTION    . LAPAROSCOPIC APPENDECTOMY N/A 12/30/2013   Procedure: APPENDECTOMY LAPAROSCOPIC;  Surgeon: Jackolyn Confer, MD;  Location: [facility];  Service: General;  Laterality: N/A;  . THYROIDECTOMY N/A 07/22/2019   Procedure: TOTAL THYROIDECTOMY WITH CENTRAL COMPARTMENT LYMPH NODE DISSECTION ZONE VI;  Surgeon: Armandina Gemma, MD;  Location: [facility];  Service: General;  Laterality: N/A;   Social History   Socioeconomic History  . Marital status: Single    Spouse name: Not on file  . Number of children: 1  . Years of education: Not on file  . Highest education level: Not on file  Occupational History  . 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 Smoking status: Current Every Day Smoker    Packs/day: 0.10    Types: Cigarettes  . Smokeless tobacco: Never Used  Vaping Use  . Vaping Use: Never used  Substance and Sexual Activity  . Alcohol use: No    Alcohol/week: 0.0 standard drinks  . Drug use: No  . Sexual activity: Yes    Birth control/protection: None  Other Topics Concern  . Not on file  Social History Narrative  . Not on file   Social Determinants of Health   Financial Resource Strain:   . Difficulty of Paying Living Expenses: Not on file  Food Insecurity:   . Worried About Charity fundraiser in the Last Year: Not on file  . Ran Out of Food in the Last Year: Not on file   Transportation Needs:   . Lack of Transportation (Medical): Not on file  . Lack of Transportation (Non-Medical): Not on file  Physical Activity:   . Days of Exercise per Week: Not on file  . Minutes of Exercise per Session: Not on file  Stress:   . Feeling of Stress : Not on file  Social Connections:   . Frequency of Communication with Friends and Family: Not on file  . Frequency of Social Gatherings with Friends and Family: Not on file  . Attends Religious Services: Not on file  . Active Member of Clubs or Organizations: Not on file  . Attends Archivist Meetings: Not on file  . Marital Status: Not on file  Intimate Partner Violence:   . Fear of Current or Ex-Partner: Not on file  . Emotionally Abused: Not on file  . Physically Abused: Not on file  . Sexually Abused: Not on file   Current Outpatient Medications on File Prior to Visit  Medication Sig Dispense Refill  . acetaminophen (TYLENOL) 500 MG tablet Take 1,000 mg by mouth every 6 (  six) hours as needed for mild pain.    . busPIRone (BUSPAR) 5 MG tablet Take 1 tablet (5 mg total) by mouth 2 (two) times daily. 60 tablet 0  . hydrochlorothiazide (HYDRODIURIL) 25 MG tablet Take 25 mg by mouth daily.    Marland Kitchen ibuprofen (ADVIL) 200 MG tablet Take 200 mg by mouth every 6 (six) hours as needed for moderate pain.    Marland Kitchen levothyroxine (SYNTHROID) 100 MCG tablet Take 1 tablet (100 mcg total) by mouth daily. 30 tablet 11  . Multiple Vitamin (MULTIVITAMIN WITH MINERALS) TABS tablet Take 1 tablet by mouth daily.    . traZODone (DESYREL) 50 MG tablet Take 50 mg by mouth at bedtime as needed for sleep.     Marland Kitchen triamcinolone cream (KENALOG) 0.1 % Apply 1 application topically 2 (two) times daily. 100 g 0   No current facility-administered medications on file prior to visit.   Allergies  Allergen Reactions  . Penicillins Hives and Other (See Comments)    CHILDHOOD ALLERGY Has patient had a PCN reaction causing immediate rash,  facial/tongue/throat swelling, SOB or lightheadedness with hypotension: YES Has patient had a PCN reaction causing severe rash involving mucus membranes or skin necrosis: UNKNOWN Has patient had a PCN reaction that required hospitalization: YES Has patient had a PCN reaction occurring within the last 10 years: No If all of the above answers are "NO", then may proceed with Cephalosporin use.  Marland Kitchen Pepperoni [Pickled Meat] Hives   Family History  Problem Relation Age of Onset  . Diabetes Mother   . Hypertension Mother   . Asthma Mother   . Diabetes Maternal Grandmother   . Hypertension Maternal Grandmother   . Arthritis Maternal Grandmother   . Heart disease Maternal Grandmother   . Diabetes Maternal Grandfather   . Arthritis Maternal Grandfather   . Heart disease Maternal Grandfather   . Hypertension Father   + See HPI  PE: BP 126/74 (BP Location: Left Arm, Patient Position: Sitting, Cuff Size: Normal)   Pulse 90   Ht 5' (1.524 m)   Wt 186 lb (84.4 kg)   SpO2 98%   BMI 36.33 kg/m  Wt Readings from Last 3 Encounters:  09/27/19 186 lb (84.4 kg)  07/22/19 180 lb (81.6 kg)  07/11/19 180 lb (81.6 kg)   Constitutional: overweight, in NAD Eyes: PERRLA, EOMI, no exophthalmos ENT: moist mucous membranes, no neck masses palpated, thyroidectomy scar healing, with only little colloid, no erythema, no dysesthesia no cervical lymphadenopathy Cardiovascular: RRR, No MRG Respiratory: CTA B Gastrointestinal: abdomen soft, NT, ND, BS+ Musculoskeletal: no deformities, strength intact in all 4 Skin: moist, warm, no rashes Neurological: no tremor with outstretched hands, DTR normal in all 4  ASSESSMENT: 1. Thyroid cancer - see HPI  2. Postsurgical Hypothyroidism  PLAN:  1. Thyroid cancer - papillary -I had a long discussion with the patient about her recent diagnosis of thyroid cancer. We reviewed together the pathology >> she is clinically stage 1 TNM due to age (not stage II since no  distant metastasis confirmed so far), but pathologically she is mpT3N1a. -I reassured her that overall, papillary thyroid cancer has good prognosis -since the tumor was >1.5 cm, multifocal, and with lymphovascular invasion and extension outside the thyroid, radioactive iodine is indicated for post-op thyroid remnant ablation.  Since she is high risk for recurrence, radioactive iodine will be employed to both facilitate monitoring in the long run (by checking thyroglobulin) but also to ablate any remaining sites of metastasis/extension. I  explained what this entails.  We will do this with Thyrogen stimulation, rather than hormonal withdrawal.  I explained that a whole-body scan will be done afterwards to check for any metastasis or local extension.  Also, we will plan to repeat a whole-body scan in a year after the previous.  Pt agrees to have these tests scheduled.  -I recommended a low iodine diet for 10 days prior to the treatment 4 days after.  We also discussed about radiation safety precautions. -At next visit, we will check a thyroglobulin and ATA antibodies, which will serve as a baseline.  We will continue to follow with thyroglobulin and ATA antibodies on a 6-12 month basis. -I will then see the patient in approximately 3 months  2.  Patient with h/o total thyroidectomy for cancer, now with iatrogenic hypothyroidism, on levothyroxine therapy.  - she appears euthyroid but does complain of fatigue, dry skin, heat and cold intolerance, increased depression - We discussed about correct intake of levothyroxine, fasting, with water, separated by at least 30 minutes from breakfast, and separated by more than 4 hours from calcium, iron, multivitamins, acid reflux medications (PPIs).  Pt. is taking it correctly. - will check thyroid tests today: TSH, free T4 - target TSH: Under the lower limit of normal (ideally ~0.1) due to the high risk for recurrence. - If these are abnormal, she will need to return in  5-6 weeks for repeat labs - If these are at goal, we will recheck them at next visit  At this visit, I wrote a letter for her mother explaining that she took 2 weeks off from work 08/23-09/08/2019 to take care of her daughter.  Component     Latest Ref Rng & Units 09/27/2019  TSH     0.35 - 4.50 uIU/mL 1.24  T4,Free(Direct)     0.60 - 1.60 ng/dL 0.93   TFTs are normal, but we need to aim for a lower TSH.  I will increase her levothyroxine dose to 112 mcg daily and repeat her tests in 5 to 6 weeks.  Philemon Kingdom, MD PhD [facility]

## 2019-09-27 NOTE — Patient Instructions (Addendum)
Please stop at the lab.  Please continue levothyroxine 100 mcg daily.  Take the thyroid hormone every day, with water, at least 30 minutes before breakfast, separated by at least 4 hours from: - acid reflux medications - calcium - iron - multivitamins  We will proceed with RAI treatment.  I will order this at [facility].  Telephone number: [phone].  You will have a whole-body scan approximately 1 week after the treatment.  Please start a low iodine diet 10 days before the treatment and stop it 4 days after the treatment (see attached instructions).  Please come back for a follow-up appointment in 3 months.   Radioactive Iodine Treatment for Thyroid Cancer  Radioactive iodine treatment is a treatment for thyroid cancer. The treatment involves swallowing a pill or liquid that contains a substance called radioactive iodine (I-131), or radioiodine. The substance is absorbed by the thyroid gland and destroys cancerous tissue. This treatment may be used to kill cancer cells that remain after surgery to remove the cancer. It may also be done for thyroid cancer that has spread or has come back after treatments. Tell a health care provider about:  Any allergies you have.  All medicines you are taking, including vitamins, herbs, eye drops, creams, and over-the-counter medicines.  Any surgeries you have had.  Any medical conditions you have.  Any blood disorders you have.  Whether you are pregnant, may be pregnant, or have gone through menopause, if this applies.  Whether you are breastfeeding, if this applies.  Any contact you have with children or pregnant women.  Your travel plans for the next 3 months.  Whether you pass through radiation detectors for work or travel. What are the risks? Generally, this is a safe procedure. However, problems may occur, including:  Damage to other structures or organs, such as the salivary glands. This could lead to dry mouth and loss of  taste.  Increased risk for leukemia or other cancers.  Lower sperm count or infertility in men.  Irregular menstrual periods in women. What happens before the procedure? Eating and drinking restrictions  Follow instructions from your health care provider about eating or drinking restrictions.  Follow a low-iodine diet as told by your health care provider. Check ingredients on packaged foods and beverages because there are foods that you will need to avoid while on the low-iodine diet: ? Avoid iodized table salt and foods that have iodized salt. ? Avoid seafood, seaweed, soybeans, and soy products. ? Avoid dairy products and eggs. ? Avoid the food dye Red No. 3 because it has iodine. Tests and exams See your health care provider for any needed tests and exams. Before your treatment:  You will have tests to check your thyroid hormone levels.  You will take a small dose of radioactive iodine and have a body scan. The dose will be much smaller than your normal treatment dose. The body scan will show if your body is absorbing the iodine.  You may get injections of thyroid-stimulating hormone to help your body absorb iodine. General instructions  Ask your health care provider about changing or stopping your regular medicines. You may have to stop taking your thyroid hormone medicine or other medicines.  Plan to drive yourself home after treatment with no one else in the car. Do not take public transportation. If you need someone to drive you home, sit as far away from the driver as possible.  If you are breastfeeding, ask your health care provider when to  stop. Radioactive iodine can pass into your milk. What happens during the procedure?  You will have a body scan to check for cancerous cells in your thyroid.  You may be given medicine to prevent nausea or vomiting.  You will go to an isolated room with lead-lined walls. The room will protect others from the radioactive treatment  that you will receive.  You will take the radioactive iodine in liquid or pill form. You may have water to swallow the pills.  You will have to stay in the isolated room with lead-lined walls for at least 2 hours. What happens after the procedure?  You will have a body scan to check if the radioactive iodine is being absorbed by your thyroid.  Do not eat or drink for 1-2 hours after the procedure as told by your health care provider.  Your health care provider will discuss with you whether you need to stay in the hospital a few days.  Follow instructions from your health care provider about avoiding contact with other people to protect them from exposure to radiation. Summary  Radioactive iodine treatment is a treatment for thyroid cancer.  You will have to stay in the isolated room with lead-lined walls for at least 2 hours after taking the radioactive iodine.  Your health care provider will discuss with you whether you need to stay in the hospital a few days.  Do not eat or drink for 1-2 hours after the procedure as told by your health care provider.  Plan to drive yourself home after treatment with no one else in the car. Do not take public transportation. If you need someone to drive you home, sit as far away from the driver as possible. Follow instructions from your health care provider about avoiding contact with other people after the treatment. This information is not intended to replace advice given to you by your health care provider. Make sure you discuss any questions you have with your health care provider. Document Revised: 07/05/2018 Document Reviewed: 07/05/2018 Elsevier Patient Education  [place].

## 2019-09-30 ENCOUNTER — Other Ambulatory Visit: Payer: Self-pay | Admitting: Internal Medicine

## 2019-09-30 ENCOUNTER — Telehealth: Payer: Self-pay | Admitting: Internal Medicine

## 2019-09-30 DIAGNOSIS — C73 Malignant neoplasm of thyroid gland: Secondary | ICD-10-CM

## 2019-09-30 NOTE — Telephone Encounter (Signed)
I am so sorry - yes, they need the orders - I entered them! C

## 2019-09-30 NOTE — Telephone Encounter (Signed)
Patient says she called [facility] to schedule the radioactive iodine treatment and they said they needed [place] to fax them the orders. Please advise.

## 2019-10-03 ENCOUNTER — Encounter: Payer: Self-pay | Admitting: Internal Medicine

## 2019-10-04 ENCOUNTER — Encounter: Payer: Self-pay | Admitting: Internal Medicine

## 2019-10-04 ENCOUNTER — Other Ambulatory Visit: Payer: Self-pay | Admitting: Internal Medicine

## 2019-10-17 ENCOUNTER — Ambulatory Visit (HOSPITAL_COMMUNITY)
Admission: RE | Admit: 2019-10-17 | Discharge: 2019-10-17 | Disposition: A | Discharge status: Home / Self Care | Payer: 59 | Source: Ambulatory Visit | Attending: Internal Medicine | Admitting: Internal Medicine

## 2019-10-17 ENCOUNTER — Other Ambulatory Visit: Payer: Self-pay

## 2019-10-17 DIAGNOSIS — C73 Malignant neoplasm of thyroid gland: Secondary | ICD-10-CM | POA: Diagnosis not present

## 2019-10-17 MED ORDER — THYROTROPIN ALFA 1.1 MG IM SOLR: 0.9000 mg | INTRAMUSCULAR | Status: AC

## 2019-10-17 MED ORDER — THYROTROPIN ALFA 1.1 MG IM SOLR
INTRAMUSCULAR | Status: AC
  Administered 2019-10-17: 0.9 mg via INTRAMUSCULAR
  Filled 2019-10-17: qty 0.9

## 2019-10-18 ENCOUNTER — Encounter: Payer: Self-pay | Admitting: Internal Medicine

## 2019-10-18 ENCOUNTER — Telehealth: Payer: Self-pay

## 2019-10-18 ENCOUNTER — Encounter (HOSPITAL_COMMUNITY)
Admission: RE | Admit: 2019-10-18 | Discharge: 2019-10-18 | Disposition: A | Discharge status: Home / Self Care | Payer: 59 | Source: Ambulatory Visit | Attending: Internal Medicine | Admitting: Internal Medicine

## 2019-10-18 ENCOUNTER — Other Ambulatory Visit: Payer: Self-pay | Admitting: Internal Medicine

## 2019-10-18 DIAGNOSIS — C73 Malignant neoplasm of thyroid gland: Secondary | ICD-10-CM | POA: Diagnosis not present

## 2019-10-18 MED ORDER — THYROTROPIN ALFA 1.1 MG IM SOLR
INTRAMUSCULAR | Status: AC
  Filled 2019-10-18: qty 0.9

## 2019-10-18 MED ORDER — THYROTROPIN ALFA 1.1 MG IM SOLR
0.9000 mg | INTRAMUSCULAR | Status: AC
  Administered 2019-10-18: 0.9 mg via INTRAMUSCULAR

## 2019-10-18 MED ORDER — PROMETHAZINE HCL 12.5 MG PO TABS
12.5000 mg | ORAL_TABLET | Freq: Three times a day (TID) | ORAL | 0 refills | Status: DC | PRN
Start: 2019-10-18 — End: 2019-12-26

## 2019-10-18 NOTE — Telephone Encounter (Signed)
Hi doctor gherghe I started my radioactive iodine treatment but its cause me to be really nauseous is there anything u can prescribe me

## 2019-10-19 ENCOUNTER — Encounter (HOSPITAL_COMMUNITY)
Admission: RE | Admit: 2019-10-19 | Discharge: 2019-10-19 | Disposition: A | Discharge status: Home / Self Care | Payer: 59 | Source: Ambulatory Visit | Attending: Internal Medicine | Admitting: Internal Medicine

## 2019-10-19 ENCOUNTER — Other Ambulatory Visit: Payer: Self-pay

## 2019-10-19 DIAGNOSIS — C73 Malignant neoplasm of thyroid gland: Secondary | ICD-10-CM | POA: Diagnosis not present

## 2019-10-19 LAB — PREGNANCY, URINE: Preg Test, Ur: NEGATIVE

## 2019-10-19 MED ORDER — SODIUM IODIDE I 131 CAPSULE
124.1000 | Freq: Once | INTRAVENOUS | Status: AC
  Administered 2019-10-19: 124.1 via ORAL

## 2019-10-28 ENCOUNTER — Other Ambulatory Visit: Payer: Self-pay

## 2019-10-28 ENCOUNTER — Encounter: Payer: Self-pay | Admitting: Internal Medicine

## 2019-10-28 ENCOUNTER — Ambulatory Visit (HOSPITAL_COMMUNITY)
Admission: RE | Admit: 2019-10-28 | Discharge: 2019-10-28 | Disposition: A | Discharge status: Home / Self Care | Payer: 59 | Source: Ambulatory Visit | Attending: Internal Medicine | Admitting: Internal Medicine

## 2019-10-28 DIAGNOSIS — C73 Malignant neoplasm of thyroid gland: Secondary | ICD-10-CM | POA: Diagnosis not present

## 2019-11-02 ENCOUNTER — Encounter: Payer: Self-pay | Admitting: Internal Medicine

## 2019-11-03 ENCOUNTER — Other Ambulatory Visit (INDEPENDENT_AMBULATORY_CARE_PROVIDER_SITE_OTHER): Payer: 59

## 2019-11-03 ENCOUNTER — Other Ambulatory Visit: Payer: Self-pay

## 2019-11-03 ENCOUNTER — Encounter: Payer: Self-pay | Admitting: Internal Medicine

## 2019-11-03 DIAGNOSIS — E890 Postprocedural hypothyroidism: Secondary | ICD-10-CM | POA: Diagnosis not present

## 2019-11-03 LAB — TSH: TSH: 1.94 u[IU]/mL (ref 0.35–4.50)

## 2019-11-03 LAB — T4, FREE: Free T4: 0.7 ng/dL (ref 0.60–1.60)

## 2019-11-04 ENCOUNTER — Other Ambulatory Visit: Payer: Self-pay | Admitting: Internal Medicine

## 2019-11-04 ENCOUNTER — Encounter: Payer: Self-pay | Admitting: Internal Medicine

## 2019-11-04 MED ORDER — LEVOTHYROXINE SODIUM 137 MCG PO TABS: 137.0000 ug | ORAL_TABLET | Freq: Every day | ORAL | 3 refills | Status: DC

## 2019-11-15 ENCOUNTER — Telehealth: Payer: Self-pay

## 2019-11-15 NOTE — Telephone Encounter (Signed)
Per Dr Sharlet Salina response in a MyChart message 07/27/19 "I don't actually fill out disability forms for patients as a policy."

## 2019-11-16 NOTE — Telephone Encounter (Signed)
Informed pt that her PCP is unable to complete the request for disability. Pt state she is aware since her endocrinologist is the one that took her out of work previously.  Pt request that we fax the request form to her endocrinologist at [phone] Form faxed as requested.

## 2019-11-17 ENCOUNTER — Encounter: Payer: Self-pay | Admitting: Internal Medicine

## 2019-11-25 ENCOUNTER — Ambulatory Visit: Payer: 59 | Admitting: Family

## 2019-12-07 ENCOUNTER — Ambulatory Visit (INDEPENDENT_AMBULATORY_CARE_PROVIDER_SITE_OTHER): Payer: 59 | Admitting: Internal Medicine

## 2019-12-07 ENCOUNTER — Other Ambulatory Visit: Payer: Self-pay

## 2019-12-07 ENCOUNTER — Encounter: Payer: Self-pay | Admitting: Internal Medicine

## 2019-12-07 VITALS — BP 134/90 | HR 80 | Temp 98.7°F | Wt 180.0 lb

## 2019-12-07 DIAGNOSIS — R5383 Other fatigue: Secondary | ICD-10-CM

## 2019-12-07 LAB — CBC
HCT: 40.9 % (ref 36.0–46.0)
Hemoglobin: 13.5 g/dL (ref 12.0–15.0)
MCHC: 33 g/dL (ref 30.0–36.0)
MCV: 88.6 fl (ref 78.0–100.0)
Platelets: 308 10*3/uL (ref 150.0–400.0)
RBC: 4.61 Mil/uL (ref 3.87–5.11)
RDW: 13.9 % (ref 11.5–15.5)
WBC: 8.8 10*3/uL (ref 4.0–10.5)

## 2019-12-07 LAB — COMPREHENSIVE METABOLIC PANEL
ALT: 20 U/L (ref 0–35)
AST: 20 U/L (ref 0–37)
Albumin: 4.1 g/dL (ref 3.5–5.2)
Alkaline Phosphatase: 75 U/L (ref 39–117)
BUN: 9 mg/dL (ref 6–23)
CO2: 26 mEq/L (ref 19–32)
Calcium: 9.5 mg/dL (ref 8.4–10.5)
Chloride: 105 mEq/L (ref 96–112)
Creatinine, Ser: 0.74 mg/dL (ref 0.40–1.20)
GFR: 106.4 mL/min (ref >60.00)
Glucose, Bld: 88 mg/dL (ref 70–99)
Potassium: 4.2 mEq/L (ref 3.5–5.1)
Sodium: 138 mEq/L (ref 135–145)
Total Bilirubin: 0.3 mg/dL (ref 0.2–1.2)
Total Protein: 7.8 g/dL (ref 6.0–8.3)

## 2019-12-07 LAB — VITAMIN D 25 HYDROXY (VIT D DEFICIENCY, FRACTURES): VITD: 28.07 ng/mL — ABNORMAL LOW (ref 30.00–100.00)

## 2019-12-07 LAB — FERRITIN: Ferritin: 24.3 ng/mL (ref 10.0–291.0)

## 2019-12-07 LAB — HEMOGLOBIN A1C: Hgb A1c MFr Bld: 5.7 % (ref 4.6–6.5)

## 2019-12-07 LAB — VITAMIN B12: Vitamin B-12: 813 pg/mL (ref 211–911)

## 2019-12-07 NOTE — Progress Notes (Signed)
   Subjective:   Patient ID: Jessica Mcpherson, female    DOB: 1985/06/01, 34 y.o.   MRN: [id number]  HPI The patient is a 34 YO female coming in for ongoing depression/anxiety issues. She has been struggling with recent thyroid cancer diagnosis. We had prescribed buspar to help prn as well as return to work in September to help with her symptoms. She is still struggling with depression and feels tired a lot. She is struggling to motivate to go to work and was unable to go Monday or Tuesday this week. Talked to her endocrine provider and they feel that the thyroid levels are adequate although not at goal and she should not be feeling this way with thyroid. She is also having some tingling in her hands. She is willing to go on a medicine daily.   Review of Systems  Constitutional: Positive for fatigue.  HENT: Negative.   Eyes: Negative.   Respiratory: Negative for cough, chest tightness and shortness of breath.   Cardiovascular: Negative for chest pain, palpitations and leg swelling.  Gastrointestinal: Negative for abdominal distention, abdominal pain, constipation, diarrhea, nausea and vomiting.  Musculoskeletal: Negative.   Skin: Negative.   Neurological: Negative.        Tingling hands  Psychiatric/Behavioral: Positive for decreased concentration, dysphoric mood and sleep disturbance.    Objective:  Physical Exam Constitutional:      Appearance: She is well-developed.  HENT:     Head: Normocephalic and atraumatic.  Cardiovascular:     Rate and Rhythm: Normal rate and regular rhythm.  Pulmonary:     Effort: Pulmonary effort is normal. No respiratory distress.     Breath sounds: Normal breath sounds. No wheezing or rales.  Abdominal:     General: Bowel sounds are normal. There is no distension.     Palpations: Abdomen is soft.     Tenderness: There is no abdominal tenderness. There is no rebound.  Musculoskeletal:     Cervical back: Normal range of motion.  Skin:    General: Skin is  warm and dry.  Neurological:     Mental Status: She is alert and oriented to person, place, and time.     Coordination: Coordination normal.     Vitals:   12/07/19 1002  BP: 134/90  Pulse: 80  Temp: 98.7 F (37.1 C)  TempSrc: Oral  SpO2: 98%  Weight: 180 lb (81.6 kg)    This visit occurred during the SARS-CoV-2 public health emergency.  Safety protocols were in place, including screening questions prior to the visit, additional usage of staff PPE, and extensive cleaning of exam room while observing appropriate contact time as indicated for disinfecting solutions.   Assessment & Plan:

## 2019-12-07 NOTE — Assessment & Plan Note (Signed)
Checking B12, CBC, CMP, vitamin D. Thyroid levels adequate at recent test. If labs normal she agrees with lexapro 10 mg daily. Work note given.

## 2019-12-07 NOTE — Patient Instructions (Signed)
We will check the labs today and if they are normal we will send in lexapro to take daily which will help level out the happy hormones in your brain.

## 2019-12-14 ENCOUNTER — Telehealth: Payer: Self-pay

## 2019-12-14 ENCOUNTER — Encounter: Payer: Self-pay | Admitting: Internal Medicine

## 2019-12-15 ENCOUNTER — Other Ambulatory Visit: Payer: Self-pay | Admitting: Internal Medicine

## 2019-12-15 MED ORDER — ESCITALOPRAM OXALATE 10 MG PO TABS: 10.0000 mg | ORAL_TABLET | Freq: Every day | ORAL | 6 refills | Status: DC

## 2019-12-15 NOTE — Telephone Encounter (Signed)
Sent in

## 2019-12-26 ENCOUNTER — Ambulatory Visit: Payer: 59 | Admitting: Internal Medicine

## 2019-12-26 ENCOUNTER — Encounter: Payer: Self-pay | Admitting: Obstetrics & Gynecology

## 2019-12-26 ENCOUNTER — Other Ambulatory Visit: Payer: Self-pay

## 2019-12-26 ENCOUNTER — Encounter: Payer: Self-pay | Admitting: Internal Medicine

## 2019-12-26 ENCOUNTER — Other Ambulatory Visit (HOSPITAL_COMMUNITY)
Admission: RE | Admit: 2019-12-26 | Discharge: 2019-12-26 | Disposition: A | Discharge status: Home / Self Care | Payer: 59 | Source: Ambulatory Visit | Attending: Obstetrics & Gynecology | Admitting: Obstetrics & Gynecology

## 2019-12-26 ENCOUNTER — Ambulatory Visit (INDEPENDENT_AMBULATORY_CARE_PROVIDER_SITE_OTHER): Payer: 59 | Admitting: Obstetrics & Gynecology

## 2019-12-26 VITALS — BP 145/88 | HR 87 | Ht 60.0 in | Wt 177.2 lb

## 2019-12-26 DIAGNOSIS — H60391 Other infective otitis externa, right ear: Secondary | ICD-10-CM

## 2019-12-26 DIAGNOSIS — N9489 Other specified conditions associated with female genital organs and menstrual cycle: Secondary | ICD-10-CM | POA: Insufficient documentation

## 2019-12-26 DIAGNOSIS — N898 Other specified noninflammatory disorders of vagina: Secondary | ICD-10-CM | POA: Diagnosis not present

## 2019-12-26 DIAGNOSIS — Z124 Encounter for screening for malignant neoplasm of cervix: Secondary | ICD-10-CM | POA: Insufficient documentation

## 2019-12-26 DIAGNOSIS — Z113 Encounter for screening for infections with a predominantly sexual mode of transmission: Secondary | ICD-10-CM

## 2019-12-26 DIAGNOSIS — A549 Gonococcal infection, unspecified: Secondary | ICD-10-CM | POA: Insufficient documentation

## 2019-12-26 DIAGNOSIS — B9689 Other specified bacterial agents as the cause of diseases classified elsewhere: Secondary | ICD-10-CM

## 2019-12-26 DIAGNOSIS — R87810 Cervical high risk human papillomavirus (HPV) DNA test positive: Secondary | ICD-10-CM | POA: Insufficient documentation

## 2019-12-26 DIAGNOSIS — N760 Acute vaginitis: Secondary | ICD-10-CM

## 2019-12-26 MED ORDER — NEOMYCIN-POLYMYXIN-HC 3.5-10000-1 OT SUSP: 3.0000 [drp] | Freq: Three times a day (TID) | OTIC | 0 refills | Status: DC

## 2019-12-26 NOTE — Progress Notes (Signed)
GYNECOLOGY OFFICE VISIT NOTE  History:   Jessica Mcpherson is a 34 y.o. [phone] here today for evaluation of abnormal vaginal discharge and lower pelvic pain for a few weeks.  Discharge is white, no odor. Reports history of recurrent BV. Desires pap smear and STI screening except for HIV, had recent negative screen at outside facility. She denies any abnormal vaginal bleeding or other concerns.    Past Medical History:  Diagnosis Date  . Anxiety   . Arthritis    Right knee  . Depression   . GERD (gastroesophageal reflux disease)   . Headache   . Hypertension   . Miscarriage    x2  . Papillary thyroid carcinoma [facility])     Past Surgical History:  Procedure Laterality Date  . CESAREAN SECTION    . LAPAROSCOPIC APPENDECTOMY N/A 12/30/2013   Procedure: APPENDECTOMY LAPAROSCOPIC;  Surgeon: Jackolyn Confer, MD;  Location: [facility];  Service: General;  Laterality: N/A;  . THYROIDECTOMY N/A 07/22/2019   Procedure: TOTAL THYROIDECTOMY WITH CENTRAL COMPARTMENT LYMPH NODE DISSECTION ZONE VI;  Surgeon: Armandina Gemma, MD;  Location: [facility];  Service: General;  Laterality: N/A;   The following portions of the patient's history were reviewed and updated as appropriate: allergies, current medications, past family history, past medical history, past social history, past surgical history and problem list.   Health Maintenance:  Normal pap in 2017.  Review of Systems:  Pertinent items noted in HPI and remainder of comprehensive ROS otherwise negative.  Physical Exam:  BP (!) 145/88   Pulse 87   Ht 5' (1.524 m)   Wt 177 lb 3.2 oz (80.4 kg)   LMP 12/06/2019 (Approximate)   BMI 34.61 kg/m  CONSTITUTIONAL: Well-developed, well-nourished female in no acute distress.  SKIN: No rash noted. Not diaphoretic. No erythema. No pallor. MUSCULOSKELETAL: Normal range of motion. No edema noted. NEUROLOGIC: Alert and oriented to person, place, and time. Normal muscle tone coordination. No cranial nerve deficit  noted. PSYCHIATRIC: Normal mood and affect. Normal behavior. Normal judgment and thought content. CARDIOVASCULAR: Normal heart rate noted RESPIRATORY: Effort and breath sounds normal, no problems with respiration noted ABDOMEN: No masses noted. No other overt distention noted.   PELVIC: Normal appearing external genitalia; normal urethral meatus; normal appearing vaginal mucosa and cervix. Pap smear.  Copious white discharge noted, testing sample obtained.  Normal uterine size, no other palpable masses, moderate uterine tenderness on exam. No adnexal tenderness. Performed in the presence of a chaperone     Assessment and Plan:      1. Vaginal discharge Will follow up results and manage accordingly.  Proper vulvar hygiene emphasized: discussed avoidance of perfumed soaps, detergents, lotions and any type of douches; in addition to wearing cotton underwear and no underwear at night.  Also recommended cleaning front to back, voiding and cleaning up after intercourse.  - Cervicovaginal ancillary only  2. Routine screening for STI (sexually transmitted infection) - Hepatitis C antibody - Hepatitis B surface antigen - RPR - Cytology - PAP (GC, Chlam, Trich, HSV) Will follow up results and manage accordingly.    3. Uterine pain Likely related to vaginitis, will follow up results and manage accordingly.  4. Pap smear for cervical cancer screening - Cytology - PAP done, will follow up results and manage accordingly. Routine preventative health maintenance measures emphasized. Please refer to After Visit Summary for other counseling recommendations.   Return for any gynecologic concerns.    I spent 20 minutes dedicated to the care  of this patient including pre-visit review of records, face to face time with the patient discussing her conditions and treatments and post visit ordering of testing.    Verita Schneiders, MD, [place] [facility] [employer], [place]

## 2019-12-26 NOTE — Patient Instructions (Signed)
We have sent in the ear drops to use 3 drops 3 times a day for 3-5 days.

## 2019-12-26 NOTE — Patient Instructions (Signed)
Vaginitis Vaginitis is a condition in which the vaginal tissue swells and becomes red (inflamed). This condition is most often caused by a change in the normal balance of bacteria and yeast that live in the vagina. This change causes an overgrowth of certain bacteria or yeast, which causes the inflammation. There are different types of vaginitis, but the most common types are:  Bacterial vaginosis.  Yeast infection (candidiasis).  Trichomoniasis vaginitis. This is a sexually transmitted disease (STD).  Viral vaginitis.  Atrophic vaginitis.  Allergic vaginitis. What are the causes? The cause of this condition depends on the type of vaginitis. It can be caused by:  Bacteria (bacterial vaginosis).  Yeast, which is a fungus (yeast infection).  A parasite (trichomoniasis vaginitis).  A virus (viral vaginitis).  Low hormone levels (atrophic vaginitis). Low hormone levels can occur during pregnancy, breastfeeding, or after menopause.  Irritants, such as bubble baths, scented tampons, and feminine sprays (allergic vaginitis). Other factors can change the normal balance of the yeast and bacteria that live in the vagina. These include:  Antibiotic medicines.  Poor hygiene.  Diaphragms, vaginal sponges, spermicides, birth control pills, and intrauterine devices (IUD).  Sex.  Infection.  Uncontrolled diabetes.  A weakened defense (immune) system. What increases the risk? This condition is more likely to develop in women who:  Smoke.  Use vaginal douches, scented tampons, or scented sanitary pads.  Wear tight-fitting pants.  Wear thong underwear.  Use oral birth control pills or an IUD.  Have sex without a condom.  Have multiple sex partners.  Have an STD.  Frequently use the spermicide nonoxynol-9.  Eat lots of foods high in sugar.  Have uncontrolled diabetes.  Have low estrogen levels.  Have a weakened immune system from an immune disorder or medical  treatment.  Are pregnant or breastfeeding. What are the signs or symptoms? Symptoms vary depending on the cause of the vaginitis. Common symptoms include:  Abnormal vaginal discharge. ? The discharge is white, gray, or yellow with bacterial vaginosis. ? The discharge is thick, white, and cheesy with a yeast infection. ? The discharge is frothy and yellow or greenish with trichomoniasis.  A bad vaginal smell. The smell is fishy with bacterial vaginosis.  Vaginal itching, pain, or swelling.  Sex that is painful.  Pain or burning when urinating. Sometimes there are no symptoms. How is this diagnosed? This condition is diagnosed based on your symptoms and medical history. A physical exam, including a pelvic exam, will also be done. You may also have other tests, including:  Tests to determine the pH level (acidity or alkalinity) of your vagina.  A whiff test, to assess the odor that results when a sample of your vaginal discharge is mixed with a potassium hydroxide solution.  Tests of vaginal fluid. A sample will be examined under a microscope. How is this treated? Treatment varies depending on the type of vaginitis you have. Your treatment may include:  Antibiotic creams or pills to treat bacterial vaginosis and trichomoniasis.  Antifungal medicines, such as vaginal creams or suppositories, to treat a yeast infection.  Medicine to ease discomfort if you have viral vaginitis. Your sexual partner should also be treated.  Estrogen delivered in a cream, pill, suppository, or vaginal ring to treat atrophic vaginitis. If vaginal dryness occurs, lubricants and moisturizing creams may help. You may need to avoid scented soaps, sprays, or douches.  Stopping use of a product that is causing allergic vaginitis. Then using a vaginal cream to treat the symptoms. Follow   these instructions at home: Lifestyle  Keep your genital area clean and dry. Avoid soap, and only rinse the area with  water.  Do not douche or use tampons until your health care provider says it is okay to do so. Use sanitary pads, if needed.  Do not have sex until your health care provider approves. When you can return to sex, practice safe sex and use condoms.  Wipe from front to back. This avoids the spread of bacteria from the rectum to the vagina. General instructions  Take over-the-counter and prescription medicines only as told by your health care provider.  If you were prescribed an antibiotic medicine, take or use it as told by your health care provider. Do not stop taking or using the antibiotic even if you start to feel better.  Keep all follow-up visits as told by your health care provider. This is important. How is this prevented?  Use mild, non-scented products. Do not use things that can irritate the vagina, such as fabric softeners. Avoid the following products if they are scented: ? Feminine sprays. ? Detergents. ? Tampons. ? Feminine hygiene products. ? Soaps or bubble baths.  Let air reach your genital area. ? Wear cotton underwear to reduce moisture buildup. ? Avoid wearing underwear while you sleep. ? Avoid wearing tight pants and underwear or nylons without a cotton panel. ? Avoid wearing thong underwear.  Take off any wet clothing, such as bathing suits, as soon as possible.  Practice safe sex and use condoms. Contact a health care provider if:  You have abdominal pain.  You have a fever.  You have symptoms that last for more than 2-3 days. Get help right away if:  You have a fever and your symptoms suddenly get worse. Summary  Vaginitis is a condition in which the vaginal tissue becomes inflamed.This condition is most often caused by a change in the normal balance of bacteria and yeast that live in the vagina.  Treatment varies depending on the type of vaginitis you have.  Do not douche, use tampons , or have sex until your health care provider approves. When  you can return to sex, practice safe sex and use condoms. This information is not intended to replace advice given to you by your health care provider. Make sure you discuss any questions you have with your health care provider. Document Revised: 12/12/2016 Document Reviewed: 02/05/2016 Elsevier Patient Education  2020 [employer]  

## 2019-12-27 ENCOUNTER — Encounter: Payer: Self-pay | Admitting: Internal Medicine

## 2019-12-27 DIAGNOSIS — H6693 Otitis media, unspecified, bilateral: Secondary | ICD-10-CM | POA: Insufficient documentation

## 2019-12-27 DIAGNOSIS — L299 Pruritus, unspecified: Secondary | ICD-10-CM | POA: Insufficient documentation

## 2019-12-27 LAB — CERVICOVAGINAL ANCILLARY ONLY
Bacterial Vaginitis (gardnerella): POSITIVE — AB
Candida Glabrata: NEGATIVE
Candida Vaginitis: NEGATIVE
Comment: NEGATIVE
Comment: NEGATIVE
Comment: NEGATIVE

## 2019-12-27 LAB — RPR, QUANT+TP ABS (REFLEX)
Rapid Plasma Reagin, Quant: 1:1 {titer} — ABNORMAL HIGH
T Pallidum Abs: NONREACTIVE

## 2019-12-27 LAB — HEPATITIS B SURFACE ANTIGEN: Hepatitis B Surface Ag: NEGATIVE

## 2019-12-27 LAB — RPR: RPR Ser Ql: REACTIVE — AB

## 2019-12-27 LAB — HEPATITIS C ANTIBODY: Hep C Virus Ab: <0.1 s/co ratio (ref 0.0–0.9)

## 2019-12-27 NOTE — Assessment & Plan Note (Signed)
Rx cortisporin ear drops.  

## 2019-12-27 NOTE — Progress Notes (Signed)
   Subjective:   Patient ID: Jessica Mcpherson, female    DOB: 1985-12-16, 34 y.o.   MRN: [id number]  HPI The patient is a 34 YO female coming in for about 2 weeks of ear pain. Denies fevers or chills. Right ear only. Does have some intermittent ringing in the ears. Denies hearing loss. Denies sinus congestion or drainage. Denies any injury at the onset or loud noise exposure. The pain is in the ear and down into the neck. Denies pain with chewing or talking. Overall stable but not improving.   Review of Systems  Constitutional: Negative.   HENT: Positive for ear pain and tinnitus. Negative for congestion, ear discharge, hearing loss, sinus pressure, sinus pain and sneezing.   Eyes: Negative.   Respiratory: Negative for cough, chest tightness and shortness of breath.   Cardiovascular: Negative for chest pain, palpitations and leg swelling.  Gastrointestinal: Negative for abdominal distention, abdominal pain, constipation, diarrhea, nausea and vomiting.  Musculoskeletal: Negative.   Skin: Negative.   Neurological: Negative.   Psychiatric/Behavioral: Negative.     Objective:  Physical Exam Constitutional:      Appearance: She is well-developed and well-nourished.  HENT:     Head: Normocephalic and atraumatic.     Comments: Right TM bulging clear fluid and pain with tugging on the ear. No pain to palpation of the right temporal region or TMJ region.     Left Ear: Tympanic membrane normal.  Eyes:     Extraocular Movements: EOM normal.  Cardiovascular:     Rate and Rhythm: Normal rate and regular rhythm.  Pulmonary:     Effort: Pulmonary effort is normal. No respiratory distress.     Breath sounds: Normal breath sounds. No wheezing or rales.  Abdominal:     General: Bowel sounds are normal. There is no distension.     Palpations: Abdomen is soft.     Tenderness: There is no abdominal tenderness. There is no rebound.  Musculoskeletal:        General: No edema.     Cervical back: Normal  range of motion.  Skin:    General: Skin is warm and dry.  Neurological:     Mental Status: She is alert and oriented to person, place, and time.     Coordination: Coordination normal.  Psychiatric:        Mood and Affect: Mood and affect normal.     Vitals:   12/26/19 1616  BP: 120/70  Pulse: 88  Temp: 98.4 F (36.9 C)  TempSrc: Oral  SpO2: 98%  Weight: 179 lb (81.2 kg)  Height: 5' (1.524 m)    This visit occurred during the SARS-CoV-2 public health emergency.  Safety protocols were in place, including screening questions prior to the visit, additional usage of staff PPE, and extensive cleaning of exam room while observing appropriate contact time as indicated for disinfecting solutions.   Assessment & Plan:

## 2019-12-28 ENCOUNTER — Other Ambulatory Visit: Payer: Self-pay

## 2019-12-28 ENCOUNTER — Encounter: Payer: Self-pay | Admitting: Internal Medicine

## 2019-12-28 ENCOUNTER — Ambulatory Visit (INDEPENDENT_AMBULATORY_CARE_PROVIDER_SITE_OTHER): Payer: 59 | Admitting: Internal Medicine

## 2019-12-28 VITALS — BP 128/80 | HR 85 | Ht 60.0 in | Wt 179.0 lb

## 2019-12-28 DIAGNOSIS — C73 Malignant neoplasm of thyroid gland: Secondary | ICD-10-CM

## 2019-12-28 DIAGNOSIS — E890 Postprocedural hypothyroidism: Secondary | ICD-10-CM

## 2019-12-28 LAB — T4, FREE: Free T4: 1.12 ng/dL (ref 0.60–1.60)

## 2019-12-28 LAB — TSH: TSH: 0.05 u[IU]/mL — ABNORMAL LOW (ref 0.35–4.50)

## 2019-12-28 MED ORDER — METRONIDAZOLE 500 MG PO TABS: 500.0000 mg | ORAL_TABLET | Freq: Two times a day (BID) | ORAL | 0 refills | Status: AC

## 2019-12-28 NOTE — Progress Notes (Signed)
Patient ID: [name], female   DOB: October 15, 1985, 34 y.o.   MRN: [id number]   This visit occurred during the SARS-CoV-2 public health emergency.  Safety protocols were in place, including screening questions prior to the visit, additional usage of staff PPE, and extensive cleaning of exam room while observing appropriate contact time as indicated for disinfecting solutions.   HPI  Jessica Mcpherson is a 34 y.o.-year-old female, referred by her PCP, Dr. Sharlet Mcpherson, for management of thyroid cancer and postsurgical hypothyroidism.  Last visit 3 months ago.  Pt. has been dx with thyroid cancer in 03/2019.   Reviewed her thyroid cancer history:  03/2019: Presented to UC for shoulder pain >> neck fullness on palpation  03/24/2019: Thyroid ultrasound: 3.4 x 1.5 x 2.1 cm solid left mid hypoechoic thyroid nodule with microcalcifications.  04/14/2019: FNA of the nodule: Suspicious for PTC  07/22/2019: Total thyroidectomy with central (zone 6) lymph node dissection:  A. THYROID GLAND, TOTAL, THYROIDECTOMY:  - Papillary thyroid carcinoma, multifocal, 2.4 cm in greatest dimension,  with angioinvasion, extrathyroidal extension and margin involvement.  - See oncology table.  B. LYMPH NODES, CENTRAL COMPARTMENT/ZONE VI, REGIONAL RESECTION:  - Metastatic carcinoma in (2) of (2) lymph nodes with extranodal  extension.  - Thymic tissue with focal involvement of peri-thymic connective tissue  by carcinoma.   ONCOLOGY TABLE:  THYROID GLAND:  Procedure: Total thyroidectomy  Tumor Focality: Multifocal  Tumor Site: Left lobe, right lobe  Tumor Size: Greatest dimension: 2.4 cm  Histologic Type: Papillary carcinoma  Margins: Positive for carcinoma in area of extrathyroidal extension (left lobe)  Angioinvasion: Present  Lymphatic Invasion: Present  Extrathyroidal Extension: Present  Regional Lymph Nodes:    Number of Lymph Nodes Involved: 2    Nodal Levels Involved: Central compartment/Zone VI     Size of Largest Metastatic Deposit: 1 cm    Extranodal Extension (ENE): Present    Number of Lymph Nodes Examined: 2    Nodal Levels Examined: Central compartment/Zone VI  Pathologic Stage Classification (pTNM, AJCC 8th Edition): mpT3, pN1a  Representative Tumor Block: A2  Comment: Case discussed with Dr. Armandina Gemma on 07/28/2019. Dr. Saralyn Pilar reviewed select slides.   07/27/2019: CT neck obtained for swelling and pain: edema at thyroid site, no abscess.  10/19/2019: RAI treatment - 124.1 mCi I-131 sodium iodide  10/28/2019: Posttreatment whole-body scan:  1. Expected tracer uptake identified within the thyroid bed compatible with residual functioning thyroid tissue. No signs of I-131 avid nodal metastasis or distant metastatic disease.  Pt denies: - feeling nodules in neck - hoarseness - dysphagia - choking - SOB with lying down  Calcium was normal after surgery: Lab Results  Component Value Date   CALCIUM 9.5 12/07/2019   CALCIUM 9.1 07/27/2019   CALCIUM 9.2 07/23/2019   CALCIUM 9.2 06/17/2019   CALCIUM 9.0 10/12/2018   CALCIUM 9.2 07/19/2018   CALCIUM 8.9 11/13/2017   CALCIUM 8.8 (L) 01/24/2017   CALCIUM 9.2 06/13/2016   CALCIUM 8.9 10/31/2014  She was on calcium supplements postop, now off.  Postsurgical hypothyroidism  Pt is on levothyroxine 137 mcg daily (dose increased 10/2019), taken: - in am - fasting - at least 30 min from b'fast - no calcium - no iron - stopped multivitamins - prev. at least 4 hours later - no PPIs - not on Biotin  Reviewed her TFTs: Lab Results  Component Value Date   TSH 1.94 11/03/2019   TSH 1.24 09/27/2019   TSH 0.86 06/17/2019   TSH  0.45 05/23/2015   FREET4 0.70 11/03/2019   FREET4 0.93 09/27/2019   FREET4 0.72 06/17/2019  04/12/2019: TSH 3.194 01/04/2019: TSH 1.11  At last visit she described: - + fatigue - + weight gain - + hot and cold intolerance - no constipation - + dry skin - no hair loss - + worse  depression, + panic attacks  She was recommended Lexapro by PCP, but she did not start yet.    She is feeling a little better compared to last visit, but still has significant fatigue.  She has + FH of thyroid disorders in: MGM - thyroid nodule.  No family history of thyroid cancer.  No history of radiation to head or neck other than RAI treatment.  No herbal supplements. No Biotin use. No recent steroids use.   She also has a history of HTN.  ROS: Constitutional: no weight gain/no weight loss, no fatigue, no subjective hyperthermia, no subjective hypothermia Eyes: no blurry vision, no xerophthalmia ENT: no sore throat, + see HPI Cardiovascular: no CP/no SOB/no palpitations/no leg swelling Respiratory: no cough/no SOB/no wheezing Gastrointestinal: no N/no V/no D/no C/no acid reflux Musculoskeletal: no muscle aches/no joint aches Skin: no rashes, no hair loss Neurological: + tremors/no numbness/no tingling/no dizziness, + HAs (migraines) - chronic + irreg. Menses - will see ObGyn  I reviewed pt's medications, allergies, PMH, social hx, family hx, and changes were documented in the history of present illness. Otherwise, unchanged from my initial visit note.  Past Medical History:  Diagnosis Date  . Anxiety   . Arthritis    Right knee  . Depression   . GERD (gastroesophageal reflux disease)   . Headache   . Hypertension   . Miscarriage    x2  . Papillary thyroid carcinoma [facility])    Past Surgical History:  Procedure Laterality Date  . CESAREAN SECTION    . LAPAROSCOPIC APPENDECTOMY N/A 12/30/2013   Procedure: APPENDECTOMY LAPAROSCOPIC;  Surgeon: Jackolyn Confer, MD;  Location: [facility];  Service: General;  Laterality: N/A;  . THYROIDECTOMY N/A 07/22/2019   Procedure: TOTAL THYROIDECTOMY WITH CENTRAL COMPARTMENT LYMPH NODE DISSECTION ZONE VI;  Surgeon: Armandina Gemma, MD;  Location: [facility];  Service: General;  Laterality: N/A;   Social History   Socioeconomic History  . Marital  status: Single    Spouse name: Not on file  . Number of children: 1  . Years of education: Not on file  . Highest education level: Not on file  Occupational History  . 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 Smoking status: Current Every Day Smoker    Packs/day: 0.10    Types: Cigarettes  . Smokeless tobacco: Never Used  Vaping Use  . Vaping Use: Never used  Substance and Sexual Activity  . Alcohol use: No    Alcohol/week: 0.0 standard drinks  . Drug use: No  . Sexual activity: Yes    Birth control/protection: None  Other Topics Concern  . Not on file  Social History Narrative  . Not on file   Social Determinants of Health   Financial Resource Strain: Not on file  Food Insecurity: Not on file  Transportation Needs: Not on file  Physical Activity: Not on file  Stress: Not on file  Social Connections: Not on file  Intimate Partner Violence: Not on file   Current Outpatient Medications on File Prior to Visit  Medication Sig Dispense Refill  . acetaminophen (TYLENOL) 500 MG tablet Take 1,000 mg by mouth every 6 (six) hours as  needed for mild pain. (Patient not taking: No sig reported)    . busPIRone (BUSPAR) 5 MG tablet Take 1 tablet (5 mg total) by mouth 2 (two) times daily. (Patient not taking: No sig reported) 60 tablet 0  . escitalopram (LEXAPRO) 10 MG tablet Take 1 tablet (10 mg total) by mouth daily. 30 tablet 6  . hydrochlorothiazide (HYDRODIURIL) 25 MG tablet Take 25 mg by mouth daily. (Patient not taking: No sig reported)    . ibuprofen (ADVIL) 200 MG tablet Take 200 mg by mouth every 6 (six) hours as needed for moderate pain.    Marland Kitchen neomycin-polymyxin-hydrocortisone (CORTISPORIN) 3.5-10000-1 OTIC suspension Place 3 drops into both ears 3 (three) times daily. 10 mL 0   No current facility-administered medications on file prior to visit.   Allergies  Allergen Reactions  . Penicillins Hives and Other (See Comments)    CHILDHOOD ALLERGY Has patient had a PCN  reaction causing immediate rash, facial/tongue/throat swelling, SOB or lightheadedness with hypotension: YES Has patient had a PCN reaction causing severe rash involving mucus membranes or skin necrosis: UNKNOWN Has patient had a PCN reaction that required hospitalization: YES Has patient had a PCN reaction occurring within the last 10 years: No If all of the above answers are "NO", then may proceed with Cephalosporin use.  Marland Kitchen Pepperoni [Pickled Meat] Hives   Family History  Problem Relation Age of Onset  . Diabetes Mother   . Hypertension Mother   . Asthma Mother   . Diabetes Maternal Grandmother   . Hypertension Maternal Grandmother   . Arthritis Maternal Grandmother   . Heart disease Maternal Grandmother   . Diabetes Maternal Grandfather   . Arthritis Maternal Grandfather   . Heart disease Maternal Grandfather   . Hypertension Father   + See HPI  PE: BP 128/80   Pulse 85   Ht 5' (1.524 m)   Wt 179 lb (81.2 kg)   LMP 12/06/2019 (Approximate)   SpO2 98%   BMI 34.96 kg/m  Wt Readings from Last 3 Encounters:  12/28/19 179 lb (81.2 kg)  12/26/19 179 lb (81.2 kg)  12/26/19 177 lb 3.2 oz (80.4 kg)   Constitutional: overweight, in NAD Eyes: PERRLA, EOMI, no exophthalmos ENT: moist mucous membranes, no neck masses palpated, thyroidectomy scar healed with minimal keloid, no cervical lymphadenopathy Cardiovascular: RRR, No MRG Respiratory: CTA B Gastrointestinal: abdomen soft, NT, ND, BS+ Musculoskeletal: no deformities, strength intact in all 4 Skin: moist, warm, no rashes Neurological: no tremor with outstretched hands, DTR normal in all 4  ASSESSMENT: 1. Thyroid cancer - see HPI  2. Postsurgical Hypothyroidism  PLAN:  1. Thyroid cancer - papillary -Patient with clinically stage I TNM due to age (not stage II cyst no distant metastasis confirmed so far), but pathologically she is pT3N1a -Overall, papillary thyroid cancer has good prognosis, without reduced life  expectancy. -Her tumor was larger than 1.5 cm, multifocal, and with lymphovascular invasion and extension outside the thyroid so we discussed that this confers a higher risk for recurrence so radioactive iodine therapy was indicated. -She had RAI treatment on 10/19/2019.  Posttreatment whole-body scan was negative for metastasis -We will check thyroglobulin and ATA antibodies now. -I will see the patient back in 6 months  2.  Patient with history of total thyroidectomy for thyroid cancer, now with iatrogenic hypothyroidism, on levothyroxine therapy - latest thyroid labs reviewed with pt >> normal, however, our target is lower due to her history of higher risk thyroid cancer: Lab Results  Component Value Date   TSH 1.94 11/03/2019   - she continues on LT4 137 mcg daily - pt feels good on this dose.  At last visit she complained of fatigue, dry skin, heat/cold intolerance, increased depression.  She still has fatigue and we discussed about starting multivitamins, however, she does start, to take them at least 4 hours after levothyroxine. - we discussed about taking the thyroid hormone every day, with water, >30 minutes before breakfast, separated by >4 hours from acid reflux medications, calcium, iron, multivitamins. Pt. is taking it correctly. - will check thyroid tests today: TSH and fT4 - If labs are abnormal, she will need to return for repeat TFTs in 1.5 months  At last visit, I wrote a letter for her mother explaining that she took 2 weeks off from work 08/23-09/08/2019 to take care of her daughter.  Component     Latest Ref Rng & Units 12/28/2019  Thyroglobulin     ng/mL 10.0  TSH     0.35 - 4.50 uIU/mL 0.05 (L)  T4,Free(Direct)     0.60 - 1.60 ng/dL 1.12  Thyroglobulin Ab     < or = 1 IU/mL <1   TSH is a little lower than our target.  We will decrease the dose of levothyroxine to 125 mcg daily and repeat her tests in 1.5 months.  Philemon Kingdom, MD PhD [facility]  Endocrinology

## 2019-12-28 NOTE — Addendum Note (Signed)
Addended by: Verita Schneiders A on: 12/28/2019 08:52 AM   Modules accepted: Orders

## 2019-12-28 NOTE — Patient Instructions (Signed)
Please stop at the lab.  Please continue levothyroxine 137 mcg daily.  Take the thyroid hormone every day, with water, at least 30 minutes before breakfast, separated by at least 4 hours from: - acid reflux medications - calcium - iron - multivitamins  Please come back for a follow-up appointment in 6 months.

## 2019-12-29 LAB — THYROGLOBULIN LEVEL: Thyroglobulin: 10 ng/mL

## 2019-12-29 LAB — THYROGLOBULIN ANTIBODY: Thyroglobulin Ab: <1 IU/mL (ref <=1)

## 2019-12-30 LAB — CYTOLOGY - PAP
Chlamydia: NEGATIVE
Comment: NEGATIVE
Comment: NEGATIVE
Comment: NEGATIVE
Comment: NEGATIVE
Comment: NEGATIVE
Comment: NORMAL
Diagnosis: NEGATIVE
HPV 16: NEGATIVE
HPV 18 / 45: NEGATIVE
HSV1: NEGATIVE
HSV2: NEGATIVE
High risk HPV: POSITIVE — AB
Neisseria Gonorrhea: POSITIVE — AB
Trichomonas: NEGATIVE

## 2019-12-30 MED ORDER — LEVOTHYROXINE SODIUM 125 MCG PO TABS: 137.0000 ug | ORAL_TABLET | Freq: Every day | ORAL | 3 refills | Status: DC

## 2019-12-31 ENCOUNTER — Telehealth: Payer: Self-pay | Admitting: Obstetrics & Gynecology

## 2019-12-31 ENCOUNTER — Encounter: Payer: Self-pay | Admitting: Obstetrics & Gynecology

## 2019-12-31 NOTE — Progress Notes (Signed)
Patient has gonorrhea.  Testing for other STIs is negative and patient also needs to let partner(s) know so the partner(s) can get testing and treatment. Patient and sex partner(s) should abstain from unprotected sexual activity for seven days after everyone receives appropriate treatment. Patient will need to come in to office for Ceftriaxone treatment.  Patient will need to return in about 4 weeks after treatment for repeat test of cure.  Please call to inform patient of results and recommendations, and advise to pick up prescription and take as directed.  Please advise patient to practice safe sex at all times.    Verita Schneiders, MD

## 2019-12-31 NOTE — Telephone Encounter (Signed)
     Faculty Practice OB/GYN Physician Phone Call Documentation  I had a phone conversation with MARGORIE RENNER about her recent results that are visible in MyChart. She had a positive gonorrhea test, and also abnormal pap smear with positive HRHPV (negative 16, 18/45).  Discussed results with her.  For her pap smear, advised about need for repeat pap in one year and to get HPV vaccine if she had not received this.  For the gonorrhea infection, the preferred treatment is Ceftriaxone 500 mg IM x 1.  There is a notation about severe penicillin allergy for patient, but on chart review, she was noted to receive Ceftriaxone 1g IV during hospitalization for appendicitis on 12/30/2013. No reported adverse effects.  Consulted with Pharmacy team, they felt it was okay to give this medication as a 500 mg IM injection in the outpatient office.  Patient was instructed to come to office on 01/03/2020 for treatment, safe sex practices recommended.  This message was routed to the office staff at [facility].    Verita Schneiders, MD, [place] [facility] [employer], [place]

## 2020-01-02 ENCOUNTER — Telehealth: Payer: Self-pay | Admitting: *Deleted

## 2020-01-02 NOTE — Telephone Encounter (Signed)
Attempted to call pt get her scheduled for her injection, unable to leave voice, mail box full. Will try again later today.

## 2020-01-03 ENCOUNTER — Ambulatory Visit (INDEPENDENT_AMBULATORY_CARE_PROVIDER_SITE_OTHER): Payer: 59

## 2020-01-03 ENCOUNTER — Other Ambulatory Visit: Payer: Self-pay

## 2020-01-03 VITALS — BP 131/81 | HR 101

## 2020-01-03 DIAGNOSIS — Z23 Encounter for immunization: Secondary | ICD-10-CM

## 2020-01-03 DIAGNOSIS — A549 Gonococcal infection, unspecified: Secondary | ICD-10-CM | POA: Diagnosis not present

## 2020-01-03 MED ORDER — CEFTRIAXONE SODIUM 500 MG IJ SOLR
500.0000 mg | Freq: Once | INTRAMUSCULAR | Status: AC
  Administered 2020-01-03: 500 mg via INTRAMUSCULAR

## 2020-01-03 NOTE — Progress Notes (Signed)
Pt received rocephin injection and first HPV vaccine in office 01/03/2020. Pt tolerated well and will return for second vaccine in two months.

## 2020-01-03 NOTE — Progress Notes (Signed)
Patient was assessed and managed by nursing staff during this encounter. I have reviewed the chart and agree with the documentation and plan. I have also made any necessary editorial changes.  Verita Schneiders, MD 01/03/2020 11:29 AM

## 2020-01-16 ENCOUNTER — Other Ambulatory Visit: Payer: Self-pay | Admitting: *Deleted

## 2020-01-16 MED ORDER — FLUCONAZOLE 150 MG PO TABS: 150.0000 mg | ORAL_TABLET | Freq: Once | ORAL | 3 refills | Status: AC

## 2020-01-24 ENCOUNTER — Ambulatory Visit: Payer: 59 | Admitting: Internal Medicine

## 2020-01-24 DIAGNOSIS — Z0289 Encounter for other administrative examinations: Secondary | ICD-10-CM

## 2020-01-31 ENCOUNTER — Ambulatory Visit: Payer: 59

## 2020-02-08 ENCOUNTER — Encounter: Payer: Self-pay | Admitting: Internal Medicine

## 2020-02-08 ENCOUNTER — Telehealth (INDEPENDENT_AMBULATORY_CARE_PROVIDER_SITE_OTHER): Payer: 59 | Admitting: Internal Medicine

## 2020-02-08 DIAGNOSIS — H66003 Acute suppurative otitis media without spontaneous rupture of ear drum, bilateral: Secondary | ICD-10-CM | POA: Diagnosis not present

## 2020-02-08 MED ORDER — AZITHROMYCIN 250 MG PO TABS: ORAL_TABLET | ORAL | 0 refills | Status: DC

## 2020-02-08 NOTE — Assessment & Plan Note (Signed)
Rx azithromycin given pcn allergy.

## 2020-02-08 NOTE — Progress Notes (Signed)
Virtual Visit via Video Note  I connected with Jessica Mcpherson on 02/08/20 at  8:40 AM EST by a video enabled telemedicine application and verified that I am speaking with the correct person using two identifiers.  The patient and the provider were at separate locations throughout the entire encounter. Patient location: home, Provider location: work   I discussed the limitations of evaluation and management by telemedicine and the availability of in person appointments. The patient expressed understanding and agreed to proceed. The patient and the provider were the only parties present for the visit unless noted in HPI below.  History of Present Illness: The patient is a 35 y.o. female with visit for bilateral ear pain and fluid. Did have problem with right ear back mid-December. Did cortisporin ear drops and these helped some. She resolved the pain in the right ear but still with fluid. She is now having some fluid in the left ear also. Denies pain in the ears. She does deny hearing changes. She does have some pain with moving the jaw. Denies sinus congestion or cough or lung congestion. Has tried otc without relief  Observations/Objective: Appearance: normal, breathing appears normal, no pain with tugging on the ears, casual grooming, abdomen does not appear distended, throat not well visualized but not clearly red, mental status is A and O times 3  Assessment and Plan: See problem oriented charting  Follow Up Instructions: rx azithromycin for ear infection given pcn allergy  I discussed the assessment and treatment plan with the patient. The patient was provided an opportunity to ask questions and all were answered. The patient agreed with the plan and demonstrated an understanding of the instructions.   The patient was advised to call back or seek an in-person evaluation if the symptoms worsen or if the condition fails to improve as anticipated.  Hoyt Koch, MD

## 2020-02-16 ENCOUNTER — Encounter: Payer: Self-pay | Admitting: Internal Medicine

## 2020-02-20 ENCOUNTER — Other Ambulatory Visit: Payer: Self-pay

## 2020-02-20 ENCOUNTER — Ambulatory Visit: Payer: 59 | Admitting: Internal Medicine

## 2020-02-20 ENCOUNTER — Telehealth: Payer: Self-pay | Admitting: Internal Medicine

## 2020-02-20 ENCOUNTER — Encounter: Payer: Self-pay | Admitting: Internal Medicine

## 2020-02-20 DIAGNOSIS — L299 Pruritus, unspecified: Secondary | ICD-10-CM

## 2020-02-20 MED ORDER — TRIAMCINOLONE ACETONIDE 0.1 % EX CREA: 1.0000 "application " | TOPICAL_CREAM | Freq: Two times a day (BID) | CUTANEOUS | 0 refills | Status: DC

## 2020-02-20 NOTE — Progress Notes (Signed)
   Subjective:   Patient ID: Jessica Mcpherson, female    DOB: 06-Jul-1985, 35 y.o.   MRN: [id number]  HPI The patient is a 35 YO female coming in for ear itching. Started after ear drops in December and treated with antibiotics several weeks ago. They did not help. The prior tinnitus and ear pain did resolve after the ear drops. Denies fevers or chills. Denies sinus congestion or drainage. Denies cough or SOB.   Review of Systems  Constitutional: Negative.   HENT: Negative.        Ear itching  Eyes: Negative.   Respiratory: Negative for cough, chest tightness and shortness of breath.   Cardiovascular: Negative for chest pain, palpitations and leg swelling.  Gastrointestinal: Negative for abdominal distention, abdominal pain, constipation, diarrhea, nausea and vomiting.  Musculoskeletal: Negative.   Skin: Negative.   Neurological: Negative.   Psychiatric/Behavioral: Negative.     Objective:  Physical Exam Constitutional:      Appearance: She is well-developed and well-nourished.  HENT:     Head: Normocephalic and atraumatic.     Ears:     Comments: TM normal bilaterally, ear canals red without bleeding or injury, no pain with tugging on the ears Eyes:     Extraocular Movements: EOM normal.  Cardiovascular:     Rate and Rhythm: Normal rate and regular rhythm.  Pulmonary:     Effort: Pulmonary effort is normal. No respiratory distress.     Breath sounds: Normal breath sounds. No wheezing or rales.  Abdominal:     General: Bowel sounds are normal. There is no distension.     Palpations: Abdomen is soft.     Tenderness: There is no abdominal tenderness. There is no rebound.  Musculoskeletal:        General: No edema.     Cervical back: Normal range of motion.  Skin:    General: Skin is warm and dry.  Neurological:     Mental Status: She is alert and oriented to person, place, and time.     Coordination: Coordination normal.  Psychiatric:        Mood and Affect: Mood and affect  normal.     Vitals:   02/20/20 0905  BP: 112/70  Pulse: 84  Resp: 18  Temp: 98.5 F (36.9 C)  TempSrc: Oral  SpO2: 98%  Weight: 180 lb 3.2 oz (81.7 kg)  Height: 5' (1.524 m)    This visit occurred during the SARS-CoV-2 public health emergency.  Safety protocols were in place, including screening questions prior to the visit, additional usage of staff PPE, and extensive cleaning of exam room while observing appropriate contact time as indicated for disinfecting solutions.   Assessment & Plan:

## 2020-02-20 NOTE — Telephone Encounter (Signed)
[facility] calling, wondering if the  triamcinolone (KENALOG) 0.1 % is for a cream or an ointment

## 2020-02-20 NOTE — Patient Instructions (Signed)
We have sent in triamcinolone ointment to use on the q-tip and apply to the ear canal 2 times a day for 1-2 weeks.  Start taking zyrtec (cetirizine) over the counter to take daily for 2 weeks or so.

## 2020-02-20 NOTE — Assessment & Plan Note (Signed)
Advised to start zyrtec daily for 1-2 weeks. Rx triamcinolone ointment to use in ear canal with q tip for 1-2 weeks to help with likely allergic reaction to prior cortisporin ear drops.

## 2020-02-20 NOTE — Telephone Encounter (Signed)
Spoke with Jessica Mcpherson from [facility] on pharmacy to let her know that Dr. Sharlet Salina prescribed Kenalog ointment. No other questions or concerns at this time.

## 2020-02-27 ENCOUNTER — Other Ambulatory Visit: Payer: Self-pay

## 2020-02-27 ENCOUNTER — Ambulatory Visit (INDEPENDENT_AMBULATORY_CARE_PROVIDER_SITE_OTHER): Payer: 59 | Admitting: *Deleted

## 2020-02-27 ENCOUNTER — Encounter: Payer: Self-pay | Admitting: Radiology

## 2020-02-27 VITALS — BP 132/89 | HR 91

## 2020-02-27 DIAGNOSIS — Z23 Encounter for immunization: Secondary | ICD-10-CM | POA: Diagnosis not present

## 2020-02-27 NOTE — Progress Notes (Signed)
Pt here for her 2nd gardasil. Pt denies any issues with previous injection and tolerated injection well today.

## 2020-02-27 NOTE — Progress Notes (Signed)
Patient was assessed and managed by nursing staff during this encounter. I have reviewed the chart and agree with the documentation and plan. I have also made any necessary editorial changes.  Verita Schneiders, MD 02/27/2020 1:53 PM

## 2020-02-28 ENCOUNTER — Other Ambulatory Visit: Payer: Self-pay

## 2020-02-28 ENCOUNTER — Encounter: Payer: Self-pay | Admitting: Internal Medicine

## 2020-02-28 ENCOUNTER — Ambulatory Visit: Payer: 59

## 2020-02-28 ENCOUNTER — Other Ambulatory Visit (INDEPENDENT_AMBULATORY_CARE_PROVIDER_SITE_OTHER): Payer: 59

## 2020-02-28 DIAGNOSIS — E890 Postprocedural hypothyroidism: Secondary | ICD-10-CM

## 2020-02-28 LAB — TSH: TSH: 0.23 u[IU]/mL — ABNORMAL LOW (ref 0.35–4.50)

## 2020-02-28 LAB — T4, FREE: Free T4: 1.1 ng/dL (ref 0.60–1.60)

## 2020-05-01 ENCOUNTER — Ambulatory Visit: Payer: 59 | Admitting: Internal Medicine

## 2020-05-01 ENCOUNTER — Encounter: Payer: Self-pay | Admitting: Internal Medicine

## 2020-05-01 ENCOUNTER — Other Ambulatory Visit: Payer: Self-pay | Admitting: Internal Medicine

## 2020-05-01 ENCOUNTER — Other Ambulatory Visit: Payer: Self-pay

## 2020-05-01 DIAGNOSIS — E890 Postprocedural hypothyroidism: Secondary | ICD-10-CM

## 2020-05-01 DIAGNOSIS — F419 Anxiety disorder, unspecified: Secondary | ICD-10-CM | POA: Diagnosis not present

## 2020-05-01 DIAGNOSIS — F331 Major depressive disorder, recurrent, moderate: Secondary | ICD-10-CM

## 2020-05-01 MED ORDER — ALPRAZOLAM 0.5 MG PO TABS: 0.5000 mg | ORAL_TABLET | Freq: Every day | ORAL | 0 refills | Status: DC | PRN

## 2020-05-01 MED ORDER — SERTRALINE HCL 50 MG PO TABS: 50.0000 mg | ORAL_TABLET | Freq: Every day | ORAL | 3 refills | Status: DC

## 2020-05-01 NOTE — Patient Instructions (Addendum)
We have sent in sertraline to take 1 pill daily to help in 2-4 weeks with the anxiety.   In the meantime we have sent in xanax which you can use if needed for panic or anxiety.

## 2020-05-01 NOTE — Progress Notes (Signed)
   Subjective:   Patient ID: Jessica Mcpherson, female    DOB: 1985/01/17, 35 y.o.   MRN: [id number]  HPI The patient is a 35 YO female coming in for anxiety and depression. She had a major panic attack in the last week or so. Since that she has been very anxious every day. She is okay at work for the first 6 hours or so and then starts getting a lot of anxiety. She would like to reduce her hours to help. She is not taking lexapro currently as she did not feel it helped. She did take it for a few weeks. She is not able to sleep due to anxiety and is very tired due to lack of sleep which is not helping her mood.   Review of Systems  Constitutional: Negative.   HENT: Negative.   Eyes: Negative.   Respiratory: Negative for cough, chest tightness and shortness of breath.   Cardiovascular: Negative for chest pain, palpitations and leg swelling.  Gastrointestinal: Negative for abdominal distention, abdominal pain, constipation, diarrhea, nausea and vomiting.  Musculoskeletal: Negative.   Skin: Negative.   Neurological: Negative.   Psychiatric/Behavioral: Positive for decreased concentration, dysphoric mood and sleep disturbance. The patient is nervous/anxious.     Objective:  Physical Exam Constitutional:      Appearance: She is well-developed. She is obese.  HENT:     Head: Normocephalic and atraumatic.  Cardiovascular:     Rate and Rhythm: Normal rate and regular rhythm.  Pulmonary:     Effort: Pulmonary effort is normal. No respiratory distress.     Breath sounds: Normal breath sounds. No wheezing or rales.  Abdominal:     General: Bowel sounds are normal. There is no distension.     Palpations: Abdomen is soft.     Tenderness: There is no abdominal tenderness. There is no rebound.  Musculoskeletal:     Cervical back: Normal range of motion.  Skin:    General: Skin is warm and dry.  Neurological:     Mental Status: She is alert and oriented to person, place, and time.      Coordination: Coordination normal.     Vitals:   05/01/20 0945  BP: 130/80  Pulse: 77  Resp: 18  Temp: 97.9 F (36.6 C)  TempSrc: Oral  SpO2: 98%  Weight: 206 lb 9.6 oz (93.7 kg)  Height: 5' (1.524 m)    This visit occurred during the SARS-CoV-2 public health emergency.  Safety protocols were in place, including screening questions prior to the visit, additional usage of staff PPE, and extensive cleaning of exam room while observing appropriate contact time as indicated for disinfecting solutions.   Assessment & Plan:  Visit time 25 minutes in face to face communication with patient and coordination of care, additional 10 minutes spent in record review, coordination or care, ordering tests, communicating/referring to other healthcare professionals, documenting in medical records all on the same day of the visit for total time 35 minutes spent on the visit.

## 2020-05-02 ENCOUNTER — Encounter: Payer: Self-pay | Admitting: Internal Medicine

## 2020-05-02 NOTE — Telephone Encounter (Signed)
Form has been located.

## 2020-05-02 NOTE — Telephone Encounter (Signed)
I don't have the form either.

## 2020-05-03 ENCOUNTER — Other Ambulatory Visit: Payer: Self-pay

## 2020-05-03 ENCOUNTER — Other Ambulatory Visit (INDEPENDENT_AMBULATORY_CARE_PROVIDER_SITE_OTHER): Payer: 59

## 2020-05-03 DIAGNOSIS — E890 Postprocedural hypothyroidism: Secondary | ICD-10-CM

## 2020-05-03 DIAGNOSIS — F419 Anxiety disorder, unspecified: Secondary | ICD-10-CM | POA: Insufficient documentation

## 2020-05-03 LAB — TSH: TSH: 0.83 u[IU]/mL (ref 0.35–4.50)

## 2020-05-03 LAB — T4, FREE: Free T4: 1.09 ng/dL (ref 0.60–1.60)

## 2020-05-03 NOTE — Assessment & Plan Note (Signed)
Rx zoloft and xanax. She did try and fail lexapro due to ineffective.

## 2020-05-03 NOTE — Telephone Encounter (Signed)
LOV: 05/01/20  Forms have been completed &Placed in providers box to review and sign.

## 2020-05-03 NOTE — Assessment & Plan Note (Signed)
Moderate episode currently. Will start zoloft 50 mg night time to see if this helps and see her back in 1-2 months. Adjust dose as needed then. Rx short supply xanax for panic attacks.

## 2020-05-03 NOTE — Assessment & Plan Note (Signed)
Weight is increasing likely due to depression/anxiety and unable to do self-care.

## 2020-05-04 DIAGNOSIS — Z0279 Encounter for issue of other medical certificate: Secondary | ICD-10-CM

## 2020-05-04 NOTE — Telephone Encounter (Signed)
Form has been signed, Copy sent to scan &Charged for.   Patient informed and will pick up original to take to employer.

## 2020-05-22 ENCOUNTER — Encounter: Payer: Self-pay | Admitting: Radiology

## 2020-05-22 ENCOUNTER — Other Ambulatory Visit: Payer: 59

## 2020-05-22 ENCOUNTER — Other Ambulatory Visit: Payer: Self-pay

## 2020-05-22 DIAGNOSIS — N926 Irregular menstruation, unspecified: Secondary | ICD-10-CM

## 2020-05-23 LAB — BETA HCG QUANT (REF LAB): hCG Quant: <1 m[IU]/mL

## 2020-06-25 ENCOUNTER — Ambulatory Visit (INDEPENDENT_AMBULATORY_CARE_PROVIDER_SITE_OTHER): Payer: 59 | Admitting: *Deleted

## 2020-06-25 ENCOUNTER — Other Ambulatory Visit: Payer: Self-pay

## 2020-06-25 ENCOUNTER — Encounter: Payer: Self-pay | Admitting: Radiology

## 2020-06-25 VITALS — BP 132/84 | HR 89

## 2020-06-25 DIAGNOSIS — Z23 Encounter for immunization: Secondary | ICD-10-CM

## 2020-06-25 NOTE — Progress Notes (Signed)
Patient was assessed and managed by nursing staff during this encounter. I have reviewed the chart and agree with the documentation and plan.   Verita Schneiders, MD 06/25/2020 11:13 AM

## 2020-06-25 NOTE — Progress Notes (Signed)
Pt here today for 3rd gardasil. Denies any issues. Pt tolerated injection well.

## 2020-06-26 ENCOUNTER — Ambulatory Visit: Payer: 59

## 2020-07-04 ENCOUNTER — Encounter: Payer: Self-pay | Admitting: Internal Medicine

## 2020-07-04 ENCOUNTER — Ambulatory Visit: Payer: 59 | Admitting: Internal Medicine

## 2020-07-04 ENCOUNTER — Other Ambulatory Visit: Payer: Self-pay

## 2020-07-04 VITALS — BP 140/92 | HR 78 | Ht 60.0 in | Wt 171.4 lb

## 2020-07-04 DIAGNOSIS — R499 Unspecified voice and resonance disorder: Secondary | ICD-10-CM

## 2020-07-04 DIAGNOSIS — C73 Malignant neoplasm of thyroid gland: Secondary | ICD-10-CM | POA: Diagnosis not present

## 2020-07-04 DIAGNOSIS — E890 Postprocedural hypothyroidism: Secondary | ICD-10-CM | POA: Diagnosis not present

## 2020-07-04 LAB — T4, FREE: Free T4: 0.98 ng/dL (ref 0.60–1.60)

## 2020-07-04 LAB — TSH: TSH: 2.58 u[IU]/mL (ref 0.35–4.50)

## 2020-07-04 NOTE — Progress Notes (Signed)
Patient ID: Jessica Mcpherson, female   DOB: 11-Oct-1985, 35 y.o.   MRN: [id number]   This visit occurred during the SARS-CoV-2 public health emergency.  Safety protocols were in place, including screening questions prior to the visit, additional usage of staff PPE, and extensive cleaning of exam room while observing appropriate contact time as indicated for disinfecting solutions.   HPI  Jessica GOESER is a 35 y.o.-year-old female, referred by her PCP, Dr. Sharlet Salina, for management of thyroid cancer and postsurgical hypothyroidism.  Last visit 6 months ago.  Interim hx: She lost 35 lbs in last 2 mo after she stopped sodas and sweets. Drinks more water. No fatigue.  Has good energy. She is still having panic attacks-she was started on Zoloft, which now she takes as needed. She has decreased voice quality and she is unable to raise her voice after the surgery.  Reviewed history: Pt. has been dx with thyroid cancer in 03/2019.   Reviewed her thyroid cancer history:  03/2019: Presented to UC for shoulder pain >> neck fullness on palpation  03/24/2019: Thyroid ultrasound: 3.4 x 1.5 x 2.1 cm solid left mid hypoechoic thyroid nodule with microcalcifications.  04/14/2019: FNA of the nodule: Suspicious for PTC  07/22/2019: Total thyroidectomy with central (zone 6) lymph node dissection:  A. THYROID GLAND, TOTAL, THYROIDECTOMY:  - Papillary thyroid carcinoma, multifocal, 2.4 cm in greatest dimension,  with angioinvasion, extrathyroidal extension and margin involvement.  - See oncology table.  B. LYMPH NODES, CENTRAL COMPARTMENT/ZONE VI, REGIONAL RESECTION:  - Metastatic carcinoma in (2) of (2) lymph nodes with extranodal  extension.  - Thymic tissue with focal involvement of peri-thymic connective tissue  by carcinoma.   ONCOLOGY TABLE:  THYROID GLAND:  Procedure: Total thyroidectomy  Tumor Focality: Multifocal  Tumor Site: Left lobe, right lobe  Tumor Size: Greatest dimension: 2.4 cm  Histologic  Type: Papillary carcinoma  Margins: Positive for carcinoma in area of extrathyroidal extension (left lobe)  Angioinvasion: Present  Lymphatic Invasion: Present  Extrathyroidal Extension: Present  Regional Lymph Nodes:       Number of Lymph Nodes Involved: 2       Nodal Levels Involved: Central compartment/Zone VI       Size of Largest Metastatic Deposit: 1 cm       Extranodal Extension (ENE): Present       Number of Lymph Nodes Examined: 2       Nodal Levels Examined: Central compartment/Zone VI  Pathologic Stage Classification (pTNM, AJCC 8th Edition): mpT3, pN1a  Representative Tumor Block: A2  Comment: Case discussed with Dr. Armandina Gemma on 07/28/2019.  Dr. Saralyn Pilar reviewed select slides.   07/27/2019: CT neck obtained for swelling and pain: edema at thyroid site, no abscess.  10/19/2019: RAI treatment - 124.1 mCi I-131 sodium iodide  10/28/2019: Posttreatment whole-body scan:  1. Expected tracer uptake identified within the thyroid bed compatible with residual functioning thyroid tissue. No signs of I-131 avid nodal metastasis or distant metastatic disease.  Lab Results  Component Value Date   THYROGLB 10.0 12/28/2019   Lab Results  Component Value Date   THGAB <1 12/28/2019   Pt denies: - feeling nodules in neck - hoarseness - dysphagia - choking - SOB with lying down  Calcium was normal after surgery: Lab Results  Component Value Date   CALCIUM 9.5 12/07/2019   CALCIUM 9.1 07/27/2019   CALCIUM 9.2 07/23/2019   CALCIUM 9.2 06/17/2019   CALCIUM 9.0 10/12/2018   CALCIUM 9.2 07/19/2018   CALCIUM  8.9 11/13/2017   CALCIUM 8.8 (L) 01/24/2017   CALCIUM 9.2 06/13/2016   CALCIUM 8.9 10/31/2014  She was on calcium supplements postop, now off.  Postsurgical hypothyroidism  Pt is on levothyroxine 125 mcg daily: - in am - fasting - at least 30 min from b'fast - no calcium - no iron - + MVI later in the day - no PPIs - not on Biotin  Reviewed her TFTs: Lab  Results  Component Value Date   TSH 0.83 05/03/2020   TSH 0.23 (L) 02/28/2020   TSH 0.05 (L) 12/28/2019   TSH 1.94 11/03/2019   TSH 1.24 09/27/2019   TSH 0.86 06/17/2019   TSH 0.45 05/23/2015   FREET4 1.09 05/03/2020   FREET4 1.10 02/28/2020   FREET4 1.12 12/28/2019   FREET4 0.70 11/03/2019   FREET4 0.93 09/27/2019   FREET4 0.72 06/17/2019  04/12/2019: TSH 3.194 01/04/2019: TSH 1.11  At last visit she described: - + fatigue - + weight gain - + hot and cold intolerance - no constipation - + dry skin - no hair loss - + worse depression, + panic attacks.  She was recommended Lexapro by PCP, but currently on Zoloft.  She has + FH of thyroid disorders in: MGM - thyroid nodule.  No family history of thyroid cancer.  No history of radiation to head or neck other than RAI treatment.  No herbal supplements. No Biotin use. No recent steroids use.   She also has a history of HTN.  ROS: Constitutional: no weight gain/no weight loss, no fatigue, no subjective hyperthermia, no subjective hypothermia Eyes: no blurry vision, no xerophthalmia ENT: no sore throat, + see HPI Cardiovascular: no CP/no SOB/no palpitations/no leg swelling Respiratory: no cough/no SOB/no wheezing Gastrointestinal: no N/no V/no D/no C/no acid reflux Musculoskeletal: no muscle aches/no joint aches Skin: no rashes, no hair loss Neurological: no tremors/no numbness/no tingling/no dizziness, + chronic migraines  I reviewed pt's medications, allergies, PMH, social hx, family hx, and changes were documented in the history of present illness. Otherwise, unchanged from my initial visit note.  Past Medical History:  Diagnosis Date   Anxiety    Arthritis    Right knee   Depression    GERD (gastroesophageal reflux disease)    Headache    Hypertension    Miscarriage    x2   Papillary thyroid carcinoma ([place])    Past Surgical History:  Procedure Laterality Date   CESAREAN SECTION     LAPAROSCOPIC APPENDECTOMY  N/A 12/30/2013   Procedure: APPENDECTOMY LAPAROSCOPIC;  Surgeon: Jackolyn Confer, MD;  Location: [facility];  Service: General;  Laterality: N/A;   THYROIDECTOMY N/A 07/22/2019   Procedure: TOTAL THYROIDECTOMY WITH CENTRAL COMPARTMENT LYMPH NODE DISSECTION ZONE VI;  Surgeon: Armandina Gemma, MD;  Location: [facility];  Service: General;  Laterality: N/A;   Social History   Socioeconomic History   Marital status: Single    Spouse name: Not on file   Number of children: 1   Years of education: Not on file   Highest education level: Not on file  Occupational History   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  Tobacco Use   Smoking status: Every Day    Packs/day: 0.10    Pack years: 0.00    Types: Cigarettes   Smokeless tobacco: Never  Vaping Use   Vaping Use: Never used  Substance and Sexual Activity   Alcohol use: No    Alcohol/week: 0.0 standard drinks   Drug use: No   Sexual activity: Yes  Birth control/protection: None  Other Topics Concern   Not on file  Social History Narrative   Not on file   Social Determinants of Health   Financial Resource Strain: Not on file  Food Insecurity: Not on file  Transportation Needs: Not on file  Physical Activity: Not on file  Stress: Not on file  Social Connections: Not on file  Intimate Partner Violence: Not on file   Current Outpatient Medications on File Prior to Visit  Medication Sig Dispense Refill   ALPRAZolam (XANAX) 0.5 MG tablet Take 1 tablet (0.5 mg total) by mouth daily as needed for anxiety. 20 tablet 0   hydrochlorothiazide (HYDRODIURIL) 25 MG tablet Take 25 mg by mouth daily.     ibuprofen (ADVIL) 200 MG tablet Take 200 mg by mouth every 6 (six) hours as needed for moderate pain.     levothyroxine (SYNTHROID) 125 MCG tablet Take 1 tablet (125 mcg total) by mouth daily before breakfast. 45 tablet 3   sertraline (ZOLOFT) 50 MG tablet Take 1 tablet (50 mg total) by mouth daily. 30 tablet 3   triamcinolone (KENALOG) 0.1 % Apply 1 application  topically 2 (two) times daily. 30 g 0   No current facility-administered medications on file prior to visit.   Allergies  Allergen Reactions   Penicillins Hives and Other (See Comments)    CHILDHOOD ALLERGY Has patient had a PCN reaction causing immediate rash, facial/tongue/throat swelling, SOB or lightheadedness with hypotension: YES Has patient had a PCN reaction causing severe rash involving mucus membranes or skin necrosis: UNKNOWN Has patient had a PCN reaction that required hospitalization: YES Has patient had a PCN reaction occurring within the last 10 years: No If all of the above answers are "NO", then may proceed with Cephalosporin use. Received Ceftriaxone 1g IV 12/30/13   Pepperoni [Pickled Meat] Hives   Family History  Problem Relation Age of Onset   Diabetes Mother    Hypertension Mother    Asthma Mother    Diabetes Maternal Grandmother    Hypertension Maternal Grandmother    Arthritis Maternal Grandmother    Heart disease Maternal Grandmother    Diabetes Maternal Grandfather    Arthritis Maternal Grandfather    Heart disease Maternal Grandfather    Hypertension Father   + See HPI  PE: BP (!) 140/92 (BP Location: Right Arm, Patient Position: Sitting, Cuff Size: Normal)   Pulse 78   Ht 5' (1.524 m)   Wt 171 lb 6.4 oz (77.7 kg)   SpO2 98%   BMI 33.47 kg/m  Wt Readings from Last 3 Encounters:  07/04/20 171 lb 6.4 oz (77.7 kg)  05/01/20 206 lb 9.6 oz (93.7 kg)  02/20/20 180 lb 3.2 oz (81.7 kg)   Constitutional: overweight, in NAD Eyes: PERRLA, EOMI, no exophthalmos ENT: moist mucous membranes, no neck masses palpated, thyroidectomy scar healed with minimal keloid, no cervical lymphadenopathy Cardiovascular: RRR, No MRG Respiratory: CTA B Gastrointestinal: abdomen soft, NT, ND, BS+ Musculoskeletal: no deformities, strength intact in all 4 Skin: moist, warm, no rashes Neurological: no tremor with outstretched hands, DTR normal in all 4  ASSESSMENT: 1.  Thyroid cancer - see HPI  2. Postsurgical Hypothyroidism  PLAN:  1.  Papillary thyroid cancer -Patient is clinically stage I TNM due to age (stage II since no distant metastasis confirmed so far), but pathologically she is pT3N1a -Overall, papillary thyroid cancer has good prognosis, without reduced life expectancy. -Her tumor was larger than 1.5 cm, multifocal, and with lymphovascular invasion  and extension outside the thyroid, so we discussed that since this confers a higher risk for recurrence, radioactive iodine remnant ablation was indicated. -She had RAI treatment on 10/19/2019.  Posttreatment whole-body scan was negative for metastases. -We will recheck her thyroglobulin and ATA antibodies today.  Latest Tg was 10 right after the RAI treatment.  ATA's were not elevated. -I will see the patient back in 6 months.  At that time, we will recheck a whole-body scan versus a neck ultrasound.  2.  Patient with history of total thyroidectomy for thyroid cancer, now with iatrogenic hypothyroidism, on levothyroxine therapy - latest thyroid labs reviewed with pt. >> normal: Lab Results  Component Value Date   TSH 0.83 05/03/2020  - she continues on LT4 125 mcg daily, decreased at last visit, when TSH was suppressed - pt feels good on this dose. - we discussed about taking the thyroid hormone every day, with water, >30 minutes before breakfast, separated by >4 hours from acid reflux medications, calcium, iron, multivitamins. Pt. is taking it correctly. - will check thyroid tests today: TSH and fT4 - If labs are abnormal, she will need to return for repeat TFTs in 1.5 months - OTW, I will see her back in 6 months  Needs refills.  Component     Latest Ref Rng & Units 07/04/2020  Thyroglobulin     ng/mL 9.2  Comment        T4,Free(Direct)     0.60 - 1.60 ng/dL 0.98  TSH     0.35 - 4.50 uIU/mL 2.58  Thyroglobulin Ab     < or = 1 IU/mL <1  Thyroglobulin has not decreased yet.  For now, we  will continue at the same dose of levothyroxine and we will recheck them in 6 months.  At that time, we may need to increase the dose of levothyroxine.  Philemon Kingdom, MD PhD [facility]

## 2020-07-04 NOTE — Patient Instructions (Signed)
Please stop at the lab.  Please continue levothyroxine 125 mcg daily.  Take the thyroid hormone every day, with water, at least 30 minutes before breakfast, separated by at least 4 hours from: - acid reflux medications - calcium - iron - multivitamins  Please come back for a follow-up appointment in 6 months.

## 2020-07-05 ENCOUNTER — Encounter: Payer: Self-pay | Admitting: Internal Medicine

## 2020-07-05 LAB — THYROGLOBULIN LEVEL: Thyroglobulin: 9.2 ng/mL

## 2020-07-05 LAB — THYROGLOBULIN ANTIBODY: Thyroglobulin Ab: <1 IU/mL (ref <=1)

## 2020-07-05 MED ORDER — LEVOTHYROXINE SODIUM 125 MCG PO TABS: 137.0000 ug | ORAL_TABLET | Freq: Every day | ORAL | 3 refills | Status: DC

## 2020-07-06 ENCOUNTER — Encounter: Payer: Self-pay | Admitting: Internal Medicine

## 2020-08-06 ENCOUNTER — Other Ambulatory Visit: Payer: Self-pay

## 2020-08-06 ENCOUNTER — Encounter: Payer: Self-pay | Admitting: Internal Medicine

## 2020-08-06 ENCOUNTER — Ambulatory Visit: Payer: 59 | Admitting: Internal Medicine

## 2020-08-06 DIAGNOSIS — F331 Major depressive disorder, recurrent, moderate: Secondary | ICD-10-CM | POA: Diagnosis not present

## 2020-08-06 DIAGNOSIS — Z720 Tobacco use: Secondary | ICD-10-CM | POA: Diagnosis not present

## 2020-08-06 MED ORDER — BUPROPION HCL ER (XL) 150 MG PO TB24: 150.0000 mg | ORAL_TABLET | Freq: Every day | ORAL | 1 refills | Status: DC

## 2020-08-06 NOTE — Assessment & Plan Note (Signed)
She did not try the zoloft we had prescribed previously and did not follow up with [place]. She did tell endo she was taking lexapro but she had previously tried and not done well on that with [place] and denies taking that anytime recently. She is prescribed wellbutrin for benefit of mental health and smoking cessation. She is asked to take this at least 3 months and contact us if not effective or side effects.

## 2020-08-06 NOTE — Assessment & Plan Note (Signed)
Willing to make quit attempt and rx wellbutrin given today. Counseled on other strategies to help increase odds of success as well as other otc products. Advised quit date within 1 month of starting and take for at least 3 months to help maximize success rate.

## 2020-08-06 NOTE — Progress Notes (Signed)
   Subjective:   Patient ID: Jessica Mcpherson, female    DOB: 05/24/1985, 35 y.o.   MRN: [id number]  HPI The patient is a 35 YO female coming in follow up depression and wanting to quit smoking.   Review of Systems  Constitutional: Negative.   HENT: Negative.    Eyes: Negative.   Respiratory:  Negative for cough, chest tightness and shortness of breath.   Cardiovascular:  Negative for chest pain, palpitations and leg swelling.  Gastrointestinal:  Negative for abdominal distention, abdominal pain, constipation, diarrhea, nausea and vomiting.  Musculoskeletal: Negative.   Skin: Negative.   Neurological: Negative.   Psychiatric/Behavioral:  Positive for dysphoric mood. The patient is nervous/anxious.    Objective:  Physical Exam Constitutional:      Appearance: She is well-developed. She is obese.  HENT:     Head: Normocephalic and atraumatic.  Cardiovascular:     Rate and Rhythm: Normal rate and regular rhythm.  Pulmonary:     Effort: Pulmonary effort is normal. No respiratory distress.     Breath sounds: Normal breath sounds. No wheezing or rales.  Abdominal:     General: Bowel sounds are normal. There is no distension.     Palpations: Abdomen is soft.     Tenderness: There is no abdominal tenderness. There is no rebound.  Musculoskeletal:     Cervical back: Normal range of motion.  Skin:    General: Skin is warm and dry.  Neurological:     Mental Status: She is alert and oriented to person, place, and time.     Coordination: Coordination normal.    Vitals:   08/06/20 0952  BP: 128/90  Pulse: 79  Resp: 18  Temp: 98.6 F (37 C)  TempSrc: Oral  SpO2: 98%  Weight: 169 lb (76.7 kg)  Height: 5' (1.524 m)    This visit occurred during the SARS-CoV-2 public health emergency.  Safety protocols were in place, including screening questions prior to the visit, additional usage of staff PPE, and extensive cleaning of exam room while observing appropriate contact time as  indicated for disinfecting solutions.   Assessment & Plan:

## 2020-08-06 NOTE — Patient Instructions (Signed)
Think about finding a replacement activity for smoking.  We have sent in wellbutrin to take 1 pill daily to help you quit smoking. We do recommend to pick a quit date within the first month and take this for 3 months to maximize your chance for success.

## 2020-08-27 ENCOUNTER — Ambulatory Visit: Payer: 59

## 2020-09-06 ENCOUNTER — Encounter: Payer: Self-pay | Admitting: Internal Medicine

## 2020-09-10 ENCOUNTER — Ambulatory Visit: Payer: 59 | Admitting: Internal Medicine

## 2020-10-14 ENCOUNTER — Encounter: Payer: Self-pay | Admitting: Internal Medicine

## 2020-10-16 ENCOUNTER — Other Ambulatory Visit: Payer: Self-pay | Admitting: Internal Medicine

## 2020-10-16 DIAGNOSIS — E890 Postprocedural hypothyroidism: Secondary | ICD-10-CM

## 2020-10-16 NOTE — Telephone Encounter (Signed)
Patient requests to be called at ph# [phone] re: Patient sent a MyChart message with no response and Patient has questions re: getting labs done

## 2020-10-17 ENCOUNTER — Other Ambulatory Visit: Payer: Self-pay

## 2020-10-17 ENCOUNTER — Encounter: Payer: Self-pay | Admitting: Internal Medicine

## 2020-10-17 ENCOUNTER — Other Ambulatory Visit (INDEPENDENT_AMBULATORY_CARE_PROVIDER_SITE_OTHER): Payer: 59

## 2020-10-17 DIAGNOSIS — E890 Postprocedural hypothyroidism: Secondary | ICD-10-CM

## 2020-10-17 LAB — TSH: TSH: 0.88 u[IU]/mL (ref 0.35–5.50)

## 2020-10-17 LAB — T4, FREE: Free T4: 0.77 ng/dL (ref 0.60–1.60)

## 2020-10-31 ENCOUNTER — Ambulatory Visit: Payer: 59 | Admitting: Internal Medicine

## 2020-11-01 ENCOUNTER — Emergency Department (HOSPITAL_COMMUNITY): Payer: 59

## 2020-11-01 ENCOUNTER — Other Ambulatory Visit: Payer: Self-pay

## 2020-11-01 ENCOUNTER — Encounter (HOSPITAL_COMMUNITY): Payer: Self-pay | Admitting: Emergency Medicine

## 2020-11-01 ENCOUNTER — Emergency Department (HOSPITAL_COMMUNITY)
Admission: EM | Admit: 2020-11-01 | Discharge: 2020-11-01 | Disposition: A | Discharge status: Home / Self Care | Payer: 59 | Attending: Emergency Medicine | Admitting: Emergency Medicine

## 2020-11-01 DIAGNOSIS — F17210 Nicotine dependence, cigarettes, uncomplicated: Secondary | ICD-10-CM | POA: Insufficient documentation

## 2020-11-01 DIAGNOSIS — S0083XA Contusion of other part of head, initial encounter: Secondary | ICD-10-CM | POA: Insufficient documentation

## 2020-11-01 DIAGNOSIS — S0990XA Unspecified injury of head, initial encounter: Secondary | ICD-10-CM | POA: Diagnosis present

## 2020-11-01 DIAGNOSIS — I10 Essential (primary) hypertension: Secondary | ICD-10-CM | POA: Insufficient documentation
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
## 2020-11-01 MED ORDER — OXYCODONE HCL 5 MG PO TABS: 5.0000 mg | ORAL_TABLET | ORAL | 0 refills | Status: DC | PRN

## 2020-11-01 MED ORDER — HYDROCODONE-ACETAMINOPHEN 5-325 MG PO TABS
1.0000 | ORAL_TABLET | Freq: Once | ORAL | Status: AC
  Administered 2020-11-01: 1 via ORAL
  Filled 2020-11-01: qty 1
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
## 2020-11-01 NOTE — ED Notes (Signed)
Reviewed discharge instructions with patient. Follow-up care and medications reviewed. Patient  verbalized understanding. Patient A&Ox4, VSS, and ambulatory with steady gait upon discharge.  °
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
## 2020-11-07 ENCOUNTER — Other Ambulatory Visit: Payer: Self-pay

## 2020-11-07 ENCOUNTER — Encounter: Payer: Self-pay | Admitting: Internal Medicine

## 2020-11-07 ENCOUNTER — Ambulatory Visit: Payer: 59 | Admitting: Internal Medicine

## 2020-11-07 VITALS — BP 118/80 | HR 79 | Resp 18 | Ht 60.0 in | Wt 167.4 lb

## 2020-11-07 DIAGNOSIS — F5104 Psychophysiologic insomnia: Secondary | ICD-10-CM

## 2020-11-07 DIAGNOSIS — F419 Anxiety disorder, unspecified: Secondary | ICD-10-CM

## 2020-11-07 DIAGNOSIS — F331 Major depressive disorder, recurrent, moderate: Secondary | ICD-10-CM | POA: Diagnosis not present

## 2020-11-07 DIAGNOSIS — G4700 Insomnia, unspecified: Secondary | ICD-10-CM | POA: Insufficient documentation

## 2020-11-07 DIAGNOSIS — R5383 Other fatigue: Secondary | ICD-10-CM

## 2020-11-07 LAB — COMPREHENSIVE METABOLIC PANEL
ALT: 31 U/L (ref 0–35)
AST: 28 U/L (ref 0–37)
Albumin: 4.4 g/dL (ref 3.5–5.2)
Alkaline Phosphatase: 83 U/L (ref 39–117)
BUN: 10 mg/dL (ref 6–23)
CO2: 25 mEq/L (ref 19–32)
Calcium: 9.6 mg/dL (ref 8.4–10.5)
Chloride: 104 mEq/L (ref 96–112)
Creatinine, Ser: 0.8 mg/dL (ref 0.40–1.20)
GFR: 96.27 mL/min (ref >60.00)
Glucose, Bld: 101 mg/dL — ABNORMAL HIGH (ref 70–99)
Potassium: 4.4 mEq/L (ref 3.5–5.1)
Sodium: 136 mEq/L (ref 135–145)
Total Bilirubin: 0.4 mg/dL (ref 0.2–1.2)
Total Protein: 8.1 g/dL (ref 6.0–8.3)

## 2020-11-07 LAB — VITAMIN B12: Vitamin B-12: 724 pg/mL (ref 211–911)

## 2020-11-07 LAB — CBC
HCT: 42.1 % (ref 36.0–46.0)
Hemoglobin: 13.8 g/dL (ref 12.0–15.0)
MCHC: 32.7 g/dL (ref 30.0–36.0)
MCV: 90.9 fl (ref 78.0–100.0)
Platelets: 265 10*3/uL (ref 150.0–400.0)
RBC: 4.63 Mil/uL (ref 3.87–5.11)
RDW: 13.8 % (ref 11.5–15.5)
WBC: 14.1 10*3/uL — ABNORMAL HIGH (ref 4.0–10.5)

## 2020-11-07 LAB — VITAMIN D 25 HYDROXY (VIT D DEFICIENCY, FRACTURES): VITD: 11.07 ng/mL — ABNORMAL LOW (ref 30.00–100.00)

## 2020-11-07 MED ORDER — TRAZODONE HCL 50 MG PO TABS: 50.0000 mg | ORAL_TABLET | Freq: Every evening | ORAL | 3 refills | Status: DC | PRN

## 2020-11-07 NOTE — Progress Notes (Signed)
   Subjective:   Patient ID: Jessica Mcpherson, female    DOB: 1985-07-12, 35 y.o.   MRN: [id number]  HPI The patient is a 35 YO female coming in for ongoing fatigue and depression. Did not try wellbutrin. Would like to try counseling. Is thinking about taking zoloft. Having sleeping problems.  Review of Systems  Constitutional:  Positive for fatigue.  HENT: Negative.    Eyes: Negative.   Respiratory:  Negative for cough, chest tightness and shortness of breath.   Cardiovascular:  Negative for chest pain, palpitations and leg swelling.  Gastrointestinal:  Negative for abdominal distention, abdominal pain, constipation, diarrhea, nausea and vomiting.  Musculoskeletal: Negative.   Skin: Negative.   Neurological: Negative.   Psychiatric/Behavioral:  Positive for decreased concentration, dysphoric mood and sleep disturbance. The patient is nervous/anxious.    Objective:  Physical Exam Constitutional:      Appearance: She is well-developed.  HENT:     Head: Normocephalic and atraumatic.  Cardiovascular:     Rate and Rhythm: Normal rate and regular rhythm.  Pulmonary:     Effort: Pulmonary effort is normal. No respiratory distress.     Breath sounds: Normal breath sounds. No wheezing or rales.  Abdominal:     General: Bowel sounds are normal. There is no distension.     Palpations: Abdomen is soft.     Tenderness: There is no abdominal tenderness. There is no rebound.  Musculoskeletal:     Cervical back: Normal range of motion.  Skin:    General: Skin is warm and dry.  Neurological:     Mental Status: She is alert and oriented to person, place, and time.     Coordination: Coordination normal.    Vitals:   11/07/20 0939  BP: 118/80  Pulse: 79  Resp: 18  SpO2: 98%  Weight: 167 lb 6.4 oz (75.9 kg)  Height: 5' (1.524 m)    This visit occurred during the SARS-CoV-2 public health emergency.  Safety protocols were in place, including screening questions prior to the visit,  additional usage of staff PPE, and extensive cleaning of exam room while observing appropriate contact time as indicated for disinfecting solutions.   Assessment & Plan:

## 2020-11-07 NOTE — Patient Instructions (Signed)
We will check the labs.  We will get you in with a therapist.  We have sent in the trazodone for sleep.

## 2020-11-07 NOTE — Assessment & Plan Note (Signed)
Rx trazodone to help with sleep.

## 2020-11-07 NOTE — Assessment & Plan Note (Signed)
Checking B12, vitamin D, CBC, CMP. She is encouraged to eat breakfast in the morning to help. We are also working on sleep. I think her mental health is impacting this. Recent thyroid levels at goal.

## 2020-11-07 NOTE — Assessment & Plan Note (Signed)
Not well controlled and using xanax prn infrequent. Referral for counseling per patient request. She has not tried zoloft or wellbutrin which had been prescribed and she will think about trying one of these.

## 2020-11-07 NOTE — Assessment & Plan Note (Signed)
Moderate exacerbation and causing life limitation. She has not tried treatment for this although we had discussed and then prescribed zoloft and wellbutrin in the past. She is encouraged to try either zoloft or wellbutrin per her preference which she has current prescription for. Referral done to counseling per her request.

## 2020-11-09 ENCOUNTER — Other Ambulatory Visit: Payer: Self-pay | Admitting: Internal Medicine

## 2020-11-09 ENCOUNTER — Encounter: Payer: Self-pay | Admitting: Internal Medicine

## 2020-11-09 ENCOUNTER — Other Ambulatory Visit: Payer: Self-pay

## 2020-11-09 DIAGNOSIS — E890 Postprocedural hypothyroidism: Secondary | ICD-10-CM

## 2020-11-09 MED ORDER — VITAMIN D (ERGOCALCIFEROL) 1.25 MG (50000 UNIT) PO CAPS: 50000.0000 [IU] | ORAL_CAPSULE | ORAL | 0 refills | Status: DC

## 2020-11-12 ENCOUNTER — Encounter: Payer: Self-pay | Admitting: Internal Medicine

## 2020-11-13 ENCOUNTER — Other Ambulatory Visit: Payer: Self-pay

## 2020-11-13 ENCOUNTER — Inpatient Hospital Stay (HOSPITAL_COMMUNITY)
Admission: AD | Admit: 2020-11-13 | Discharge: 2020-11-13 | Disposition: A | Discharge status: Home / Self Care | Payer: 59 | Attending: Obstetrics & Gynecology | Admitting: Obstetrics & Gynecology

## 2020-11-13 ENCOUNTER — Ambulatory Visit: Payer: 59 | Admitting: Internal Medicine

## 2020-11-13 DIAGNOSIS — N939 Abnormal uterine and vaginal bleeding, unspecified: Secondary | ICD-10-CM | POA: Insufficient documentation

## 2020-11-13 DIAGNOSIS — Z3202 Encounter for pregnancy test, result negative: Secondary | ICD-10-CM | POA: Diagnosis not present

## 2020-11-13 LAB — POCT PREGNANCY, URINE: Preg Test, Ur: NEGATIVE

## 2020-11-13 LAB — HCG, QUANTITATIVE, PREGNANCY: hCG, Beta Chain, Quant, S: 6 m[IU]/mL — ABNORMAL HIGH (ref <5)

## 2020-11-13 NOTE — MAU Note (Signed)
...  Jessica Mcpherson is a 35 y.o. at Unknown here in MAU reporting: Positive UPT at home this past Thursday. Patient states she used the restroom around 30 minutes ago and noticed light pink spotting. She states she has also been experiencing constant middle lower abdominal pain for two days that she rates 4/10.  Negative UPT in MAU.  She states her LMP was 10/04/2020.

## 2020-11-13 NOTE — MAU Provider Note (Signed)
Event Date/Time   First Provider Initiated Contact with Patient 11/13/20 1226      S Jessica Mcpherson is a 35 y.o. [phone] patient who presents to MAU today with complaint of spotting. She reports positive HPTs last week. She denies any pain.   O BP (!) 132/91 (BP Location: Right Arm)   Temp 98.4 F (36.9 C) (Oral)   Resp 17   SpO2 99%  Physical Exam Vitals and nursing note reviewed.  Constitutional:      General: She is not in acute distress.    Appearance: She is well-developed.  HENT:     Head: Normocephalic.  Eyes:     Pupils: Pupils are equal, round, and reactive to light.  Cardiovascular:     Rate and Rhythm: Normal rate and regular rhythm.     Heart sounds: Normal heart sounds.  Pulmonary:     Effort: Pulmonary effort is normal. No respiratory distress.     Breath sounds: Normal breath sounds.  Abdominal:     General: Bowel sounds are normal. There is no distension.     Palpations: Abdomen is soft.     Tenderness: There is no abdominal tenderness.  Skin:    General: Skin is warm and dry.  Neurological:     Mental Status: She is alert and oriented to person, place, and time.  Psychiatric:        Mood and Affect: Mood normal.        Behavior: Behavior normal.        Thought Content: Thought content normal.        Judgment: Judgment normal.    Results for orders placed or performed during the hospital encounter of 11/13/20 (from the past 24 hour(s))  Pregnancy, urine POC     Status: None   Collection Time: 11/13/20 12:14 PM  Result Value Ref Range   Preg Test, Ur NEGATIVE NEGATIVE  hCG, quantitative, pregnancy     Status: Abnormal   Collection Time: 11/13/20 12:38 PM  Result Value Ref Range   hCG, Beta Chain, Quant, S 6 (H) <5 mIU/mL     A Medical screening exam complete Negative UPT in MAU HCG ordered  Reviewed results with patient and expressed concerns over possible early miscarriage given positive UPTs last week. Discussed 48 hour follow up and  patient agreeable to plan of care. Patient requested to come to [facility] due to closer proximity to her home.  P Discharge from MAU in stable condition Appointment for repeat HCG made Strict ectopic precautions reviewed.   Wende Mott, [place] 11/13/2020 12:26 PM

## 2020-11-15 ENCOUNTER — Ambulatory Visit: Payer: 59

## 2020-11-19 ENCOUNTER — Encounter: Payer: Self-pay | Admitting: Internal Medicine

## 2020-11-20 ENCOUNTER — Other Ambulatory Visit: Payer: Self-pay

## 2020-11-20 ENCOUNTER — Telehealth: Payer: Self-pay | Admitting: Internal Medicine

## 2020-11-20 DIAGNOSIS — E890 Postprocedural hypothyroidism: Secondary | ICD-10-CM

## 2020-11-20 MED ORDER — LEVOTHYROXINE SODIUM 125 MCG PO TABS: 137.0000 ug | ORAL_TABLET | Freq: Every day | ORAL | 3 refills | Status: DC

## 2020-11-20 MED ORDER — LEVOTHYROXINE SODIUM 125 MCG PO TABS
ORAL_TABLET | ORAL | 1 refills | Status: DC
Start: 2020-11-20 — End: 2020-12-26

## 2020-11-20 NOTE — Telephone Encounter (Signed)
Pt calling in to request refill of levothyroxine (SYNTHROID) 125 MCG tablet    [facility], [place]  [address], [place] [address]   Pt contact [phone]

## 2020-11-20 NOTE — Telephone Encounter (Signed)
Rx sent 

## 2020-11-20 NOTE — Telephone Encounter (Signed)
Rx corrected and sent to preferred pharmacy. Pt advised via MyChart.

## 2020-12-10 ENCOUNTER — Ambulatory Visit: Payer: Self-pay | Admitting: Internal Medicine

## 2020-12-11 ENCOUNTER — Ambulatory Visit: Payer: Self-pay | Admitting: Internal Medicine

## 2020-12-11 ENCOUNTER — Ambulatory Visit: Payer: 59 | Admitting: Psychology

## 2020-12-21 ENCOUNTER — Encounter: Payer: Self-pay | Admitting: Internal Medicine

## 2020-12-26 ENCOUNTER — Ambulatory Visit (INDEPENDENT_AMBULATORY_CARE_PROVIDER_SITE_OTHER): Payer: 59 | Admitting: Internal Medicine

## 2020-12-26 ENCOUNTER — Other Ambulatory Visit: Payer: 59

## 2020-12-26 ENCOUNTER — Other Ambulatory Visit: Payer: Self-pay

## 2020-12-26 ENCOUNTER — Encounter: Payer: Self-pay | Admitting: Internal Medicine

## 2020-12-26 VITALS — BP 110/72 | HR 63 | Ht 60.0 in | Wt 163.0 lb

## 2020-12-26 DIAGNOSIS — E890 Postprocedural hypothyroidism: Secondary | ICD-10-CM

## 2020-12-26 DIAGNOSIS — C73 Malignant neoplasm of thyroid gland: Secondary | ICD-10-CM

## 2020-12-26 LAB — TSH: TSH: 1.19 u[IU]/mL (ref 0.35–5.50)

## 2020-12-26 LAB — T4, FREE: Free T4: 0.93 ng/dL (ref 0.60–1.60)

## 2020-12-26 MED ORDER — LEVOTHYROXINE SODIUM 125 MCG PO TABS: 125.0000 ug | ORAL_TABLET | Freq: Every day | ORAL | 3 refills | Status: DC

## 2020-12-26 NOTE — Patient Instructions (Addendum)
Please stop at the lab.  Please continue levothyroxine 125 mcg daily.  Take the thyroid hormone every day, with water, at least 30 minutes before breakfast, separated by at least 4 hours from: - acid reflux medications - calcium - iron - multivitamins  We will schedule a new neck U/S.  Please come back for a follow-up appointment in 1 year.

## 2020-12-26 NOTE — Progress Notes (Signed)
Patient ID: Jessica Mcpherson, female   DOB: 1985/07/16, 35 y.o.   MRN: [id number]   This visit occurred during the SARS-CoV-2 public health emergency.  Safety protocols were in place, including screening questions prior to the visit, additional usage of staff PPE, and extensive cleaning of exam room while observing appropriate contact time as indicated for disinfecting solutions.   HPI  Jessica Mcpherson is a 35 y.o.-year-old female, referred by her PCP, Dr. Sharlet Salina, for management of thyroid cancer and postsurgical hypothyroidism.  Last visit 6 months ago.  Interim hx: She lost 35 lbs in the 2 months before last visit after she stopped sodas and sweets.  Since last visit, she lost several more pounds.  She feels better. She contacted me that she was pregnant in 10/2020.  We increased the dose of her levothyroxine by 2 tablets a week.  However, she had a miscarriage in 11/2020.  She is now back on 1 levothyroxine tablet a day. She has a cough which she feels may be related to her smoking, but no congestion, no fever.  Reviewed history: Pt. has been dx with thyroid cancer in 03/2019.   Reviewed her thyroid cancer history: 03/2019: Presented to UC for shoulder pain >> neck fullness on palpation  03/24/2019: Thyroid ultrasound: 3.4 x 1.5 x 2.1 cm solid left mid hypoechoic thyroid nodule with microcalcifications.  04/14/2019: FNA of the nodule: Suspicious for PTC  07/22/2019: Total thyroidectomy with central (zone 6) lymph node dissection:  A. THYROID GLAND, TOTAL, THYROIDECTOMY:  - Papillary thyroid carcinoma, multifocal, 2.4 cm in greatest dimension,  with angioinvasion, extrathyroidal extension and margin involvement.  - See oncology table.  B. LYMPH NODES, CENTRAL COMPARTMENT/ZONE VI, REGIONAL RESECTION:  - Metastatic carcinoma in (2) of (2) lymph nodes with extranodal  extension.  - Thymic tissue with focal involvement of peri-thymic connective tissue  by carcinoma.   ONCOLOGY TABLE:  THYROID  GLAND:  Procedure: Total thyroidectomy  Tumor Focality: Multifocal  Tumor Site: Left lobe, right lobe  Tumor Size: Greatest dimension: 2.4 cm  Histologic Type: Papillary carcinoma  Margins: Positive for carcinoma in area of extrathyroidal extension (left lobe)  Angioinvasion: Present  Lymphatic Invasion: Present  Extrathyroidal Extension: Present  Regional Lymph Nodes:       Number of Lymph Nodes Involved: 2       Nodal Levels Involved: Central compartment/Zone VI       Size of Largest Metastatic Deposit: 1 cm       Extranodal Extension (ENE): Present       Number of Lymph Nodes Examined: 2       Nodal Levels Examined: Central compartment/Zone VI  Pathologic Stage Classification (pTNM, AJCC 8th Edition): mpT3, pN1a  Representative Tumor Block: A2  Comment: Case discussed with Dr. Armandina Gemma on 07/28/2019.  Dr. Saralyn Pilar reviewed select slides.   07/27/2019: CT neck obtained for swelling and pain: edema at thyroid site, no abscess.  10/19/2019: RAI treatment - 124.1 mCi I-131 sodium iodide  10/28/2019: Posttreatment whole-body scan:  1. Expected tracer uptake identified within the thyroid bed compatible with residual functioning thyroid tissue. No signs of I-131 avid nodal metastasis or distant metastatic disease.  Lab Results  Component Value Date   THYROGLB 9.2 07/04/2020   THYROGLB 10.0 12/28/2019   Lab Results  Component Value Date   THGAB <1 07/04/2020   THGAB <1 12/28/2019   Pt denies: - feeling nodules in neck - hoarseness - dysphagia - choking - SOB with lying down  Calcium  was normal after surgery: Lab Results  Component Value Date   CALCIUM 9.6 11/07/2020   CALCIUM 9.5 12/07/2019   CALCIUM 9.1 07/27/2019   CALCIUM 9.2 07/23/2019   CALCIUM 9.2 06/17/2019   CALCIUM 9.0 10/12/2018   CALCIUM 9.2 07/19/2018   CALCIUM 8.9 11/13/2017   CALCIUM 8.8 (L) 01/24/2017   CALCIUM 9.2 06/13/2016  She was on calcium supplements postop, now off.  Postsurgical  hypothyroidism  Pt is on levothyroxine 125 mcg daily: - in am - fasting - at least 30 min from b'fast - no calcium - no iron - + MVI later in the day (at night) - no PPIs - not on Biotin  Reviewed her TFTs: Lab Results  Component Value Date   TSH 0.88 10/17/2020   TSH 2.58 07/04/2020   TSH 0.83 05/03/2020   TSH 0.23 (L) 02/28/2020   TSH 0.05 (L) 12/28/2019   TSH 1.94 11/03/2019   TSH 1.24 09/27/2019   TSH 0.86 06/17/2019   TSH 0.45 05/23/2015   FREET4 0.77 10/17/2020   FREET4 0.98 07/04/2020   FREET4 1.09 05/03/2020   FREET4 1.10 02/28/2020   FREET4 1.12 12/28/2019   FREET4 0.70 11/03/2019   FREET4 0.93 09/27/2019   FREET4 0.72 06/17/2019  04/12/2019: TSH 3.194 01/04/2019: TSH 1.11  She has + FH of thyroid disorders in: MGM - thyroid nodule.  No family history of thyroid cancer.  No history of radiation to head or neck other than RAI treatment.  No herbal supplements. No Biotin use. No recent steroids use.   She also has a history of HTN, depression/anxiety.  ROS: + see HPI + chronic HAs  I reviewed pt's medications, allergies, PMH, social hx, family hx, and changes were documented in the history of present illness. Otherwise, unchanged from my initial visit note.  Past Medical History:  Diagnosis Date   Anxiety    Arthritis    Right knee   Depression    GERD (gastroesophageal reflux disease)    Headache    Hypertension    Miscarriage    x2   Papillary thyroid carcinoma ([place])    Past Surgical History:  Procedure Laterality Date   CESAREAN SECTION     LAPAROSCOPIC APPENDECTOMY N/A 12/30/2013   Procedure: APPENDECTOMY LAPAROSCOPIC;  Surgeon: Jackolyn Confer, MD;  Location: [facility];  Service: General;  Laterality: N/A;   THYROIDECTOMY N/A 07/22/2019   Procedure: TOTAL THYROIDECTOMY WITH CENTRAL COMPARTMENT LYMPH NODE DISSECTION ZONE VI;  Surgeon: Armandina Gemma, MD;  Location: [facility];  Service: General;  Laterality: N/A;   Social History   Socioeconomic  History   Marital status: Single    Spouse name: Not on file   Number of children: 1   Years of education: Not on file   Highest education level: Not on file  Occupational History   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  Tobacco Use   Smoking status: Every Day    Packs/day: 0.10    Types: Cigarettes   Smokeless tobacco: Never  Vaping Use   Vaping Use: Never used  Substance and Sexual Activity   Alcohol use: No    Alcohol/week: 0.0 standard drinks   Drug use: No   Sexual activity: Yes    Birth control/protection: None  Other Topics Concern   Not on file  Social History Narrative   Not on file   Social Determinants of Health   Financial Resource Strain: Not on file  Food Insecurity: Not on file  Transportation Needs: Not on file  Physical  Activity: Not on file  Stress: Not on file  Social Connections: Not on file  Intimate Partner Violence: Not on file   Current Outpatient Medications on File Prior to Visit  Medication Sig Dispense Refill   ALPRAZolam (XANAX) 0.5 MG tablet Take 1 tablet (0.5 mg total) by mouth daily as needed for anxiety. 20 tablet 0   levothyroxine (SYNTHROID) 125 MCG tablet Take 1 tablet 5 days a week and 2 tablets 2 days a week. 108 tablet 1   sertraline (ZOLOFT) 50 MG tablet Take 50 mg by mouth daily.     traZODone (DESYREL) 50 MG tablet Take 1-2 tablets (50-100 mg total) by mouth at bedtime as needed for sleep. 60 tablet 3   Vitamin D, Ergocalciferol, (DRISDOL) 1.25 MG (50000 UNIT) CAPS capsule Take 1 capsule (50,000 Units total) by mouth every 7 (seven) days. 12 capsule 0   No current facility-administered medications on file prior to visit.   Allergies  Allergen Reactions   Penicillins Hives and Other (See Comments)    CHILDHOOD ALLERGY Has patient had a PCN reaction causing immediate rash, facial/tongue/throat swelling, SOB or lightheadedness with hypotension: YES Has patient had a PCN reaction causing severe rash involving mucus membranes or skin  necrosis: UNKNOWN Has patient had a PCN reaction that required hospitalization: YES Has patient had a PCN reaction occurring within the last 10 years: No If all of the above answers are "NO", then may proceed with Cephalosporin use. Received Ceftriaxone 1g IV 12/30/13   Pepperoni [Pickled Meat] Hives   Family History  Problem Relation Age of Onset   Diabetes Mother    Hypertension Mother    Asthma Mother    Diabetes Maternal Grandmother    Hypertension Maternal Grandmother    Arthritis Maternal Grandmother    Heart disease Maternal Grandmother    Diabetes Maternal Grandfather    Arthritis Maternal Grandfather    Heart disease Maternal Grandfather    Hypertension Father   + See HPI  PE: BP 110/72 (BP Location: Right Arm, Patient Position: Sitting, Cuff Size: Normal)    Pulse 63    Ht 5' (1.524 m)    Wt 163 lb (73.9 kg)    SpO2 98%    BMI 31.83 kg/m  Wt Readings from Last 3 Encounters:  12/26/20 163 lb (73.9 kg)  11/07/20 167 lb 6.4 oz (75.9 kg)  08/06/20 169 lb (76.7 kg)   Constitutional: overweight, in NAD Eyes: PERRLA, EOMI, no exophthalmos ENT: moist mucous membranes, no neck masses palpated, thyroidectomy scar healed with minimal keloid, no cervical lymphadenopathy Cardiovascular: RRR, No MRG Respiratory: CTA B Musculoskeletal: no deformities, strength intact in all 4 Skin: moist, warm, no rashes Neurological: no tremor with outstretched hands, DTR normal in all 4  ASSESSMENT: 1. Thyroid cancer - see HPI  2. Postsurgical Hypothyroidism  PLAN:  1.  Papillary thyroid cancer -Patient with clinically stage I TNM due to age (since no distant metastases confirmed so far), but pathologically she is pT3N1a -Overall, papillary thyroid cancer has good prognosis, with to reduce life expectancy or quality of life -Her tumor was larger than 1.5 cm, multifocal, and with lymphovascular invasion and extension outside the thyroid, so we discussed that since this confers a higher  risk for recurrence, radioactive iodine remnant ablation was indicated -She has RAI treatment on 10/19/2019 and posttreatment whole-body scan showed no metastases or abnormal takes sites. -At last visit, thyroglobulin was still detectable while ATA's were not elevated -We will plan to repeat these now -  At today's visit, we will check a neck ultrasound -I explained why this is needed -I will see the patient back in 1 year  2.  Patient with history of thyroidectomy for thyroid cancer, now with iatrogenic hypothyroidism, on levothyroxine therapy -During the pregnancy, we increased her levothyroxine dose by 2 tablets a week, but after her miscarriage, she switched to once a day - latest thyroid labs reviewed with pt. >> normal: Lab Results  Component Value Date   TSH 0.88 10/17/2020  - she continues on LT4 125 mcg a day - pt feels good on this dose. She lost 7 lbs since last OV. - we discussed about taking the thyroid hormone every day, with water, >30 minutes before breakfast, separated by >4 hours from acid reflux medications, calcium, iron, multivitamins. Pt. is taking it correctly. - will check thyroid tests today: TSH and fT4 - If labs are abnormal, she will need to return for repeat TFTs in 1.5 months  Needs refills - [facility]  Component     Latest Ref Rng & Units 12/26/2020  Thyroglobulin     ng/mL 4.6  Comment        T4,Free(Direct)     0.60 - 1.60 ng/dL 0.93  TSH     0.35 - 5.50 uIU/mL 1.19  Thyroglobulin Ab     < or = 1 IU/mL <1  The TSH is normal, however, it is slightly higher than our target.  I would suggest to increase the dose of levothyroxine to 137 mcg daily and repeat the test in 1.5 months.  Her thyroglobulin level decreased by 50%.    Thyroid ultrasound is pending.  Philemon Kingdom, MD PhD [facility]

## 2020-12-27 ENCOUNTER — Encounter: Payer: Self-pay | Admitting: Internal Medicine

## 2020-12-27 DIAGNOSIS — E890 Postprocedural hypothyroidism: Secondary | ICD-10-CM

## 2020-12-27 LAB — THYROGLOBULIN LEVEL: Thyroglobulin: 4.6 ng/mL

## 2020-12-27 LAB — THYROGLOBULIN ANTIBODY: Thyroglobulin Ab: <1 IU/mL (ref <=1)

## 2020-12-27 MED ORDER — LEVOTHYROXINE SODIUM 137 MCG PO TABS: 137.0000 ug | ORAL_TABLET | Freq: Every day | ORAL | 5 refills | Status: DC

## 2021-01-02 ENCOUNTER — Emergency Department (HOSPITAL_COMMUNITY)
Admission: EM | Admit: 2021-01-02 | Discharge: 2021-01-02 | Disposition: A | Discharge status: Home / Self Care | Payer: 59 | Attending: Emergency Medicine | Admitting: Emergency Medicine

## 2021-01-02 DIAGNOSIS — I10 Essential (primary) hypertension: Secondary | ICD-10-CM | POA: Insufficient documentation

## 2021-01-02 DIAGNOSIS — F17210 Nicotine dependence, cigarettes, uncomplicated: Secondary | ICD-10-CM | POA: Diagnosis not present

## 2021-01-02 DIAGNOSIS — Z79899 Other long term (current) drug therapy: Secondary | ICD-10-CM | POA: Diagnosis not present

## 2021-01-02 DIAGNOSIS — L03317 Cellulitis of buttock: Secondary | ICD-10-CM | POA: Diagnosis not present

## 2021-01-02 DIAGNOSIS — M79652 Pain in left thigh: Secondary | ICD-10-CM | POA: Diagnosis present

## 2021-01-02 MED ORDER — DOXYCYCLINE HYCLATE 100 MG PO CAPS: 100.0000 mg | ORAL_CAPSULE | Freq: Two times a day (BID) | ORAL | 0 refills | Status: DC

## 2021-01-02 MED ORDER — DOXYCYCLINE HYCLATE 100 MG PO TABS
100.0000 mg | ORAL_TABLET | Freq: Once | ORAL | Status: AC
  Administered 2021-01-02: 01:00:00 100 mg via ORAL
  Filled 2021-01-02: qty 1

## 2021-01-02 NOTE — ED Triage Notes (Signed)
Pt c/o abscess on L buttock "for a little while," states it busted "a couple days ago," drained but still painful.

## 2021-01-02 NOTE — ED Provider Notes (Signed)
[facility] EMERGENCY DEPARTMENT Provider Note   CSN: [id number] Arrival date & time: 01/02/21  0017     History Chief Complaint  Patient presents with   Abscess    Jessica Mcpherson is a 35 y.o. female.  Patient presents to the emergency department with a chief complaint of bump on left buttock.  She states that it is painful and drained pus a few days ago.  It is no longer draining.  She denies fevers or chills.  Denies any treatments prior to arrival.  Denies pregnancy or breast-feeding.  It is worsened with palpation.  The history is provided by the patient. No language interpreter was used.      Past Medical History:  Diagnosis Date   Anxiety    Arthritis    Right knee   Depression    GERD (gastroesophageal reflux disease)    Headache    Hypertension    Miscarriage    x2   Papillary thyroid carcinoma [facility])     Patient Active Problem List   Diagnosis Date Noted   Insomnia 11/07/2020   Anxiety 05/03/2020   Morbid obesity ([place]) 05/03/2020   Ear itching 12/27/2019   Gonorrhea 12/26/2019   Papanicolaou smear of cervix with positive high risk human papilloma virus (HPV) test 12/26/2019   Other fatigue 06/18/2019   Papillary thyroid carcinoma ([place]) 05/03/2019   Essential hypertension 05/03/2019   Tobacco abuse 05/03/2019   Depression 05/03/2019    Past Surgical History:  Procedure Laterality Date   CESAREAN SECTION     LAPAROSCOPIC APPENDECTOMY N/A 12/30/2013   Procedure: APPENDECTOMY LAPAROSCOPIC;  Surgeon: Jackolyn Confer, MD;  Location: [facility];  Service: General;  Laterality: N/A;   THYROIDECTOMY N/A 07/22/2019   Procedure: TOTAL THYROIDECTOMY WITH CENTRAL COMPARTMENT LYMPH NODE DISSECTION ZONE VI;  Surgeon: Armandina Gemma, MD;  Location: [facility];  Service: General;  Laterality: N/A;     OB History     Gravida  5   Para  1   Term  1   Preterm      AB  2   Living  1      SAB  1   IAB      Ectopic      Multiple      Live  Births  1           Family History  Problem Relation Age of Onset   Diabetes Mother    Hypertension Mother    Asthma Mother    Diabetes Maternal Grandmother    Hypertension Maternal Grandmother    Arthritis Maternal Grandmother    Heart disease Maternal Grandmother    Diabetes Maternal Grandfather    Arthritis Maternal Grandfather    Heart disease Maternal Grandfather    Hypertension Father     Social History   Tobacco Use   Smoking status: Every Day    Packs/day: 0.10    Types: Cigarettes   Smokeless tobacco: Never  Vaping Use   Vaping Use: Never used  Substance Use Topics   Alcohol use: No    Alcohol/week: 0.0 standard drinks   Drug use: No    Home Medications Prior to Admission medications   Medication Sig Start Date End Date Taking? Authorizing Provider  doxycycline (VIBRAMYCIN) 100 MG capsule Take 1 capsule (100 mg total) by mouth 2 (two) times daily. 01/02/21  Yes Montine Circle, PA-C  ALPRAZolam Duanne Moron) 0.5 MG tablet Take 1 tablet (0.5 mg total) by mouth daily as needed  for anxiety. 05/01/20   Hoyt Koch, MD  levothyroxine (SYNTHROID) 137 MCG tablet Take 1 tablet (137 mcg total) by mouth daily before breakfast. 12/27/20   Philemon Kingdom, MD  sertraline (ZOLOFT) 50 MG tablet Take 50 mg by mouth daily. 11/06/20   [provider]  traZODone (DESYREL) 50 MG tablet Take 1-2 tablets (50-100 mg total) by mouth at bedtime as needed for sleep. 11/07/20   Hoyt Koch, MD  Vitamin D, Ergocalciferol, (DRISDOL) 1.25 MG (50000 UNIT) CAPS capsule Take 1 capsule (50,000 Units total) by mouth every 7 (seven) days. 11/09/20   Hoyt Koch, MD    Allergies    Penicillins and Pepperoni [pickled meat]  Review of Systems   Review of Systems  All other systems reviewed and are negative.  Physical Exam Updated Vital Signs BP 128/85    Pulse 99    Temp 98.4 F (36.9 C) (Oral)    Resp 16    SpO2 100%   Physical Exam Vitals and  nursing note reviewed.  Constitutional:      General: She is not in acute distress.    Appearance: She is well-developed.  HENT:     Head: Normocephalic and atraumatic.  Eyes:     Conjunctiva/sclera: Conjunctivae normal.  Cardiovascular:     Rate and Rhythm: Normal rate.     Heart sounds: No murmur heard. Pulmonary:     Effort: Pulmonary effort is normal. No respiratory distress.  Abdominal:     General: There is no distension.  Genitourinary:    Comments: Megan, RN, chaperone present Small 1 x 1 cm indurated area to left lateral buttock, nonfluctuant, no active discharge or drainage Musculoskeletal:     Cervical back: Neck supple.     Comments: Moves all extremities  Skin:    General: Skin is warm and dry.  Neurological:     Mental Status: She is alert and oriented to person, place, and time.  Psychiatric:        Mood and Affect: Mood normal.        Behavior: Behavior normal.    ED Results / Procedures / Treatments   Labs (all labs ordered are listed, but only abnormal results are displayed) Labs Reviewed - No data to display  EKG None  Radiology No results found.  Procedures Procedures   Medications Ordered in ED Medications  doxycycline (VIBRA-TABS) tablet 100 mg (has no administration in time range)    ED Course  I have reviewed the triage vital signs and the nursing notes.  Pertinent labs & imaging results that were available during my care of the patient were reviewed by me and considered in my medical decision making (see chart for details).    MDM Rules/Calculators/A&P                         Patient here with small bump to left buttock.  She states that the abscess drained purulent discharge earlier in the week, but remains somewhat painful.  On my exam, does not appear fluctuant, and I have a low suspicion for retained fluid collection.  We will start patient on doxycycline.  She states that she recently miscarried and is not pregnant or  breast-feeding.    Final Clinical Impression(s) / ED Diagnoses Final diagnoses:  Cellulitis of buttock    Rx / DC Orders ED Discharge Orders          Ordered    doxycycline (VIBRAMYCIN) 100  MG capsule  2 times daily        01/02/21 0034             Montine Circle, PA-C 01/02/21 0036    Deno Etienne, DO 01/02/21 [phone]

## 2021-01-29 ENCOUNTER — Encounter: Payer: Self-pay | Admitting: Internal Medicine

## 2021-01-29 ENCOUNTER — Other Ambulatory Visit: Payer: Self-pay | Admitting: Internal Medicine

## 2021-01-30 ENCOUNTER — Other Ambulatory Visit: Payer: 59

## 2021-01-31 ENCOUNTER — Ambulatory Visit (INDEPENDENT_AMBULATORY_CARE_PROVIDER_SITE_OTHER): Payer: 59 | Admitting: *Deleted

## 2021-01-31 ENCOUNTER — Other Ambulatory Visit: Payer: Self-pay

## 2021-01-31 VITALS — BP 132/89 | HR 89 | Ht 60.0 in | Wt 151.6 lb

## 2021-01-31 DIAGNOSIS — N898 Other specified noninflammatory disorders of vagina: Secondary | ICD-10-CM

## 2021-01-31 DIAGNOSIS — Z3202 Encounter for pregnancy test, result negative: Secondary | ICD-10-CM | POA: Diagnosis not present

## 2021-01-31 DIAGNOSIS — Z789 Other specified health status: Secondary | ICD-10-CM

## 2021-01-31 LAB — POCT URINE PREGNANCY: Preg Test, Ur: NEGATIVE

## 2021-01-31 NOTE — Progress Notes (Signed)
° °  SUBJECTIVE:  36 y.o. female complains of malodorous and itchy vaginal discharge for 2-3 day(s). Denies abnormal vaginal bleeding or significant pelvic pain or fever. No UTI symptoms. Denies history of known exposure to STD.  No LMP recorded (lmp unknown).  OBJECTIVE:  She appears well, afebrile. Urine dipstick: not done.  ASSESSMENT:  Vaginal Discharge  Vaginal Odor Vaginal Itching UPT Negative Declined STD lab work   PLAN:  GC, chlamydia, trichomonas, BVAG, CVAG probe sent to lab. Treatment: To be determined once lab results are received ROV prn if symptoms persist or worsen.   Beta Hcg per patient request, unknown LMP  Derl Barrow, RN

## 2021-02-01 LAB — BETA HCG QUANT (REF LAB): hCG Quant: <1 m[IU]/mL

## 2021-02-01 LAB — CERVICOVAGINAL ANCILLARY ONLY

## 2021-02-04 ENCOUNTER — Ambulatory Visit (HOSPITAL_COMMUNITY)
Admission: EM | Admit: 2021-02-04 | Discharge: 2021-02-04 | Disposition: A | Discharge status: Home / Self Care | Payer: 59 | Attending: Psychiatry | Admitting: Psychiatry

## 2021-02-04 DIAGNOSIS — F331 Major depressive disorder, recurrent, moderate: Secondary | ICD-10-CM | POA: Diagnosis not present

## 2021-02-04 MED ORDER — TRAZODONE HCL 50 MG PO TABS: 50.0000 mg | ORAL_TABLET | Freq: Every evening | ORAL | 0 refills | Status: DC | PRN

## 2021-02-04 MED ORDER — SERTRALINE HCL 25 MG PO TABS: 25.0000 mg | ORAL_TABLET | Freq: Every day | ORAL | 0 refills | Status: DC

## 2021-02-04 NOTE — Discharge Instructions (Signed)
Patient is instructed prior to discharge to: Take all medications as prescribed by his/her mental healthcare provider. Report any adverse effects and or reactions from the medicines to his/her outpatient provider promptly. Patient has been instructed & cautioned: To not engage in alcohol and or illegal drug use while on prescription medicines. In the event of worsening symptoms, patient is instructed to call the crisis hotline, 911 and or go to the nearest ED for appropriate evaluation and treatment of symptoms. To follow-up with his/her primary care provider for your other medical issues, concerns and or health care needs.   Patient is instructed prior to discharge to: Take all medications as prescribed by his/her mental healthcare provider. Report any adverse effects and or reactions from the medicines to his/her outpatient provider promptly. Patient has been instructed & cautioned: To not engage in alcohol and or illegal drug use while on prescription medicines. In the event of worsening symptoms, patient is instructed to call the crisis hotline, 911 and or go to the nearest ED for appropriate evaluation and treatment of symptoms. To follow-up with his/her primary care provider for your other medical issues, concerns and or health care needs.  Please contact one of the following facilities to start medication management and therapy services:   [facility] [address]  [place], [place] [address] [phone]   [place]  [address] [place], [place] [address] [phone]  [place]  [address] [place], [place] [address] [phone]  [facility]  [address] [place]  [place], [place] [address] [phone]  [facility]  [place], [place] [address] [phone]  [place]  [address],  [place],  [address] [phone]

## 2021-02-04 NOTE — Progress Notes (Signed)
°   02/04/21 0945  [place] Triage Screening (Walk-ins at [facility] only)  What Is the Reason for Your Visit/Call Today? Patient presents requesting to see a nurse practitioner, stating she is dealing with "mental health symptoms" possiblly related to having thyroid removed July 2021.  She has struggled with "feeling overly emotional. I cry every day."  She describes triggers as "little things that shouldn't cause me to cry."  She has utilized EAP therapy, but found it was not helpful.  No other psych treatment hx.  She shares she has been in an unhealthy relationship for the past 8 years "which is not helping."  She has not been to work for almost a week, stating she is having difficulty getting out of bed.  She reports low energy, low motivation, fearful, overwhelmed. She reports poor sleep, maybe sleeping 4 hours a night most nights.  Appetite is poor and she has lost 28 lbs in the past 6 months. Patient denies SI, HI, AVH or SA hx.  How Long Has This Been Causing You Problems? > than 6 months  Have You Recently Had Any Thoughts About Hurting Yourself? No  Are You Planning to Commit Suicide/Harm Yourself At This time? No  Have you Recently Had Thoughts About [place]? No  Are You Planning To Harm Someone At This Time? No  Are you currently experiencing any auditory, visual or other hallucinations? No  Have You Used Any Alcohol or Drugs in the Past 24 Hours? No  Do you have any current medical co-morbidities that require immediate attention? No  Clinician description of patient physical appearance/behavior: Patient appears depressed, tearful.  Cooperative and pleasant.  What Do You Feel Would Help You the Most Today? Treatment for Depression or other mood problem  If access to [facility] was not available, would you have sought care in the Emergency Department? No  Determination of Need Routine (7 days)  Options For Referral Medication Management;Outpatient Therapy
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
## 2021-02-04 NOTE — ED Notes (Signed)
Pt given AVS, work note and rx as indicated.  Verbalized understanding of follow up instruction.  No distress noted at time of discharge.

## 2021-02-07 ENCOUNTER — Other Ambulatory Visit: Payer: 59

## 2021-02-21 ENCOUNTER — Ambulatory Visit (INDEPENDENT_AMBULATORY_CARE_PROVIDER_SITE_OTHER): Payer: 59 | Admitting: Psychology

## 2021-02-21 DIAGNOSIS — F320 Major depressive disorder, single episode, mild: Secondary | ICD-10-CM
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
## 2021-03-01 ENCOUNTER — Ambulatory Visit
Admission: RE | Admit: 2021-03-01 | Discharge: 2021-03-01 | Disposition: A | Discharge status: Home / Self Care | Payer: 59 | Source: Ambulatory Visit | Attending: Internal Medicine | Admitting: Internal Medicine

## 2021-03-01 ENCOUNTER — Encounter: Payer: Self-pay | Admitting: Internal Medicine

## 2021-03-01 DIAGNOSIS — C73 Malignant neoplasm of thyroid gland: Secondary | ICD-10-CM

## 2021-03-04 ENCOUNTER — Other Ambulatory Visit: Payer: Self-pay | Admitting: Internal Medicine

## 2021-03-04 DIAGNOSIS — C73 Malignant neoplasm of thyroid gland: Secondary | ICD-10-CM

## 2021-03-21 ENCOUNTER — Other Ambulatory Visit: Payer: Self-pay

## 2021-03-21 ENCOUNTER — Inpatient Hospital Stay: Admission: RE | Admit: 2021-03-21 | Payer: 59 | Source: Ambulatory Visit

## 2021-03-21 ENCOUNTER — Ambulatory Visit (HOSPITAL_COMMUNITY)
Admission: RE | Admit: 2021-03-21 | Discharge: 2021-03-21 | Disposition: A | Discharge status: Home / Self Care | Payer: 59 | Source: Ambulatory Visit | Attending: Internal Medicine | Admitting: Internal Medicine

## 2021-03-21 DIAGNOSIS — C73 Malignant neoplasm of thyroid gland: Secondary | ICD-10-CM | POA: Diagnosis not present

## 2021-03-21 MED ORDER — SODIUM CHLORIDE (PF) 0.9 % IJ SOLN
INTRAMUSCULAR | Status: AC
  Filled 2021-03-21: qty 50

## 2021-03-21 MED ORDER — IOHEXOL 300 MG/ML  SOLN
75.0000 mL | Freq: Once | INTRAMUSCULAR | Status: AC | PRN
  Administered 2021-03-21: 17:00:00 75 mL via INTRAVENOUS

## 2021-03-26 ENCOUNTER — Encounter: Payer: Self-pay | Admitting: Internal Medicine

## 2021-04-08 ENCOUNTER — Other Ambulatory Visit: Payer: Self-pay | Admitting: Internal Medicine

## 2021-04-08 ENCOUNTER — Ambulatory Visit: Payer: 59 | Admitting: Internal Medicine

## 2021-04-08 DIAGNOSIS — C73 Malignant neoplasm of thyroid gland: Secondary | ICD-10-CM

## 2021-04-08 MED ORDER — VITAMIN D (ERGOCALCIFEROL) 1.25 MG (50000 UNIT) PO CAPS: 50000.0000 [IU] | ORAL_CAPSULE | ORAL | 0 refills | Status: DC

## 2021-04-08 NOTE — Progress Notes (Deleted)
Patient ID: Jessica Mcpherson, female   DOB: [birth year], 36 y.o.   MRN: [id number]  ? ?This visit occurred during the SARS-CoV-2 public health emergency.  Safety protocols were in place, including screening questions prior to the visit, additional usage of staff PPE, and extensive cleaning of exam room while observing appropriate contact time as indicated for disinfecting solutions.  ? ?HPI  ?Jessica Mcpherson is a 36 y.o.-year-old female, referred by her PCP, Dr. Sharlet Salina, for management of thyroid cancer and postsurgical hypothyroidism.  Last visit 6 months ago. ? ?Interim hx: ?She lost 35 lbs in the 2 months before last visit after she stopped sodas and sweets.  Since last visit, she lost several more pounds.  She feels better. ?She contacted me that she was pregnant in 10/2020.  We increased the dose of her levothyroxine by 2 tablets a week.  However, she had a miscarriage in 11/2020.  She is now back on 1 levothyroxine tablet a day. ?She has a cough which she feels may be related to her smoking, but no congestion, no fever. ? ?Reviewed history: ?Pt. has been dx with thyroid cancer in 03/2019.  ? ?Reviewed her thyroid cancer history: ?03/2019: Presented to UC for shoulder pain >> neck fullness on palpation ? ?03/24/2019: Thyroid ultrasound: 3.4 x 1.5 x 2.1 cm solid left mid hypoechoic thyroid nodule with microcalcifications. ? ?04/14/2019: FNA of the nodule: Suspicious for PTC ? ?07/22/2019: Total thyroidectomy with central (zone 6) lymph node dissection:  ?A. THYROID GLAND, TOTAL, THYROIDECTOMY:  ?- Papillary thyroid carcinoma, multifocal, 2.4 cm in greatest dimension,  ?with angioinvasion, extrathyroidal extension and margin involvement.  ?- See oncology table.  ?B. LYMPH NODES, CENTRAL COMPARTMENT/ZONE VI, REGIONAL RESECTION:  ?- Metastatic carcinoma in (2) of (2) lymph nodes with extranodal  ?extension.  ?- Thymic tissue with focal involvement of peri-thymic connective tissue  ?by carcinoma.  ? ?ONCOLOGY TABLE:  ?THYROID  GLAND:  ?Procedure: Total thyroidectomy  ?Tumor Focality: Multifocal  ?Tumor Site: Left lobe, right lobe  ?Tumor Size: Greatest dimension: 2.4 cm  ?Histologic Type: Papillary carcinoma  ?Margins: Positive for carcinoma in area of extrathyroidal extension (left lobe)  ?Angioinvasion: Present  ?Lymphatic Invasion: Present  ?Extrathyroidal Extension: Present  ?Regional Lymph Nodes:  ?     Number of Lymph Nodes Involved: 2  ?     Nodal Levels Involved: Central compartment/Zone VI  ?     Size of Largest Metastatic Deposit: 1 cm  ?     Extranodal Extension (ENE): Present  ?     Number of Lymph Nodes Examined: 2  ?     Nodal Levels Examined: Central compartment/Zone VI  ?Pathologic Stage Classification (pTNM, AJCC 8th Edition): mpT3, pN1a  ?Representative Tumor Block: A2  ?Comment: Case discussed with Dr. Armandina Gemma on 07/28/2019.  Dr. Saralyn Pilar reviewed select slides.  ? ?07/27/2019: CT neck obtained for swelling and pain: edema at thyroid site, no abscess. ? ?10/19/2019: RAI treatment - 124.1 mCi I-131 sodium iodide ? ?10/28/2019: Posttreatment whole-body scan:  ?1. Expected tracer uptake identified within the thyroid bed ?compatible with residual functioning thyroid tissue. No signs of ?I-131 avid nodal metastasis or distant metastatic disease. ? ?03/01/2021: Neck U/S: ?Rounded soft tissue mass within the left neck (0 genius), with no typical or lymph node architecture, measuring 6 x 8 x 5 mm ? ?03/22/2021: CT neck: ?The left-sided neck mass appears to be likely related to mildly enlarged lymph nodes in the left jugular chain-symmetric and hypervascular, suspicioous for metastatic lymph  nodes; not change since the 2021 CT scan ? ?Lab Results  ?Component Value Date  ? THYROGLB 4.6 12/26/2020  ? THYROGLB 9.2 07/04/2020  ? THYROGLB 10.0 12/28/2019  ? ?Lab Results  ?Component Value Date  ? THGAB <1 12/26/2020  ? THGAB <1 07/04/2020  ? THGAB <1 12/28/2019  ? ?Pt denies: ?- feeling nodules in neck ?- hoarseness ?- dysphagia ?-  choking ?- SOB with lying down ? ?Calcium was normal after surgery: ?Lab Results  ?Component Value Date  ? CALCIUM 9.6 11/07/2020  ? CALCIUM 9.5 12/07/2019  ? CALCIUM 9.1 07/27/2019  ? CALCIUM 9.2 07/23/2019  ? CALCIUM 9.2 06/17/2019  ? CALCIUM 9.0 10/12/2018  ? CALCIUM 9.2 07/19/2018  ? CALCIUM 8.9 11/13/2017  ? CALCIUM 8.8 (L) 01/24/2017  ? CALCIUM 9.2 06/13/2016  ?She was on calcium supplements postop, now off. ? ?Postsurgical hypothyroidism ? ?Pt is on levothyroxine 137 mcg daily (increased 12/2020): ?- in am ?- fasting ?- at least 30 min from b'fast ?- no calcium ?- no iron ?- + MVI later in the day (at night) ?- no PPIs ?- not on Biotin ? ?Reviewed her TFTs: ?Lab Results  ?Component Value Date  ? TSH 1.19 12/26/2020  ? TSH 0.88 10/17/2020  ? TSH 2.58 07/04/2020  ? TSH 0.83 05/03/2020  ? TSH 0.23 (L) 02/28/2020  ? TSH 0.05 (L) 12/28/2019  ? TSH 1.94 11/03/2019  ? TSH 1.24 09/27/2019  ? TSH 0.86 06/17/2019  ? TSH 0.45 05/23/2015  ? FREET4 0.93 12/26/2020  ? FREET4 0.77 10/17/2020  ? FREET4 0.98 07/04/2020  ? FREET4 1.09 05/03/2020  ? FREET4 1.10 02/28/2020  ? FREET4 1.12 12/28/2019  ? FREET4 0.70 11/03/2019  ? FREET4 0.93 09/27/2019  ? FREET4 0.72 06/17/2019  ?04/12/2019: TSH 3.194 ?01/04/2019: TSH 1.11 ? ?She has + FH of thyroid disorders in: MGM - thyroid nodule.  No family history of thyroid cancer.  No history of radiation to head or neck other than RAI treatment. ? ?No herbal supplements. No Biotin use. No recent steroids use.  ? ?She also has a history of HTN, depression/anxiety. ? ?ROS: ?+ see HPI + chronic HAs ? ?I reviewed pt's medications, allergies, PMH, social hx, family hx, and changes were documented in the history of present illness. Otherwise, unchanged from my initial visit note. ? ?Past Medical History:  ?Diagnosis Date  ? Anxiety   ? Arthritis   ? Right knee  ? Depression   ? GERD (gastroesophageal reflux disease)   ? Headache   ? Hypertension   ? Miscarriage   ? x2  ? Papillary thyroid  carcinoma ([place])   ? ?Past Surgical History:  ?Procedure Laterality Date  ? CESAREAN SECTION    ? LAPAROSCOPIC APPENDECTOMY N/A 12/30/2013  ? Procedure: APPENDECTOMY LAPAROSCOPIC;  Surgeon: Jackolyn Confer, MD;  Location: [facility];  Service: General;  Laterality: N/A;  ? THYROIDECTOMY N/A 07/22/2019  ? Procedure: TOTAL THYROIDECTOMY WITH CENTRAL COMPARTMENT LYMPH NODE DISSECTION ZONE VI;  Surgeon: Armandina Gemma, MD;  Location: [facility];  Service: General;  Laterality: N/A;  ? ?Social History  ? ?Socioeconomic History  ? Marital status: Single  ?  Spouse name: Not on file  ? Number of children: 1  ? Years of education: Not on file  ? Highest education level: Not on file  ?Occupational History  ? 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 Smoking status: Every Day  ?  Packs/day: 0.10  ?  Types: Cigarettes  ? Smokeless tobacco: Never  ?  Vaping Use  ? Vaping Use: Never used  ?Substance and Sexual Activity  ? Alcohol use: No  ?  Alcohol/week: 0.0 standard drinks  ? Drug use: No  ? Sexual activity: Yes  ?  Birth control/protection: None  ?Other Topics Concern  ? Not on file  ?Social History Narrative  ? Not on file  ? ?Social Determinants of Health  ? ?Financial Resource Strain: Not on file  ?Food Insecurity: Not on file  ?Transportation Needs: Not on file  ?Physical Activity: Not on file  ?Stress: Not on file  ?Social Connections: Not on file  ?Intimate Partner Violence: Not on file  ? ?Current Outpatient Medications on File Prior to Visit  ?Medication Sig Dispense Refill  ? ALPRAZolam (XANAX) 0.5 MG tablet Take 1 tablet (0.5 mg total) by mouth daily as needed for anxiety. 20 tablet 0  ? levothyroxine (SYNTHROID) 137 MCG tablet Take 1 tablet (137 mcg total) by mouth daily before breakfast. 45 tablet 5  ? sertraline (ZOLOFT) 25 MG tablet Take 1 tablet (25 mg total) by mouth daily. 30 tablet 0  ? traZODone (DESYREL) 50 MG tablet Take 1 tablet (50 mg total) by mouth at bedtime as needed for sleep. 30 tablet 0  ? Vitamin D,  Ergocalciferol, (DRISDOL) 1.25 MG (50000 UNIT) CAPS capsule TAKE 1 CAPSULE BY MOUTH EVERY 7 DAYS 12 capsule 0  ? ?No current facility-administered medications on file prior to visit.  ? ?Allergies  ?Allergen Reactions

## 2021-04-13 ENCOUNTER — Encounter: Payer: Self-pay | Admitting: Internal Medicine

## 2021-04-18 ENCOUNTER — Telehealth: Payer: Self-pay

## 2021-04-18 NOTE — Telephone Encounter (Signed)
Inbound call from [facility] imaging to advise thyroid biopsy is actually a neck mass biopsy and will need to be sent to the hospital and is not something that can be at their office. ?

## 2021-04-24 ENCOUNTER — Ambulatory Visit: Payer: 59 | Admitting: Family Medicine

## 2021-04-24 ENCOUNTER — Other Ambulatory Visit (HOSPITAL_COMMUNITY)
Admission: RE | Admit: 2021-04-24 | Discharge: 2021-04-24 | Disposition: A | Discharge status: Home / Self Care | Payer: 59 | Source: Ambulatory Visit | Attending: Family Medicine | Admitting: Family Medicine

## 2021-04-24 ENCOUNTER — Other Ambulatory Visit: Payer: Self-pay | Admitting: Internal Medicine

## 2021-04-24 ENCOUNTER — Encounter: Payer: Self-pay | Admitting: Internal Medicine

## 2021-04-24 ENCOUNTER — Encounter: Payer: Self-pay | Admitting: Family Medicine

## 2021-04-24 VITALS — BP 112/76 | HR 82 | Ht 60.0 in | Wt 154.0 lb

## 2021-04-24 DIAGNOSIS — Z30015 Encounter for initial prescription of vaginal ring hormonal contraceptive: Secondary | ICD-10-CM | POA: Diagnosis not present

## 2021-04-24 DIAGNOSIS — R87810 Cervical high risk human papillomavirus (HPV) DNA test positive: Secondary | ICD-10-CM

## 2021-04-24 DIAGNOSIS — C73 Malignant neoplasm of thyroid gland: Secondary | ICD-10-CM

## 2021-04-24 DIAGNOSIS — Z309 Encounter for contraceptive management, unspecified: Secondary | ICD-10-CM | POA: Diagnosis not present

## 2021-04-24 LAB — POCT URINE PREGNANCY: Preg Test, Ur: NEGATIVE

## 2021-04-24 MED ORDER — ANNOVERA 0.013-0.15 MG/24HR VA RING: 1.0000 [IU] | VAGINAL_RING | VAGINAL | 1 refills | Status: DC

## 2021-04-24 NOTE — Progress Notes (Signed)
RGYN patient presents for consult visit today to discuss contraception.  ? ?Pt interested in [place]  ? ?Last had unprotected intercourse x 1 wk ago  ? ?UPT today in office Negative. ? ?LMP: 04/06/21 cycles last 7 days with mod - heavy flow at start of period then light.  ? ?Last pap: 12/26/2019 +HPV  ? ?Pt would like to do self swab.  ?Requested STD screening / BV and yeast . ? ? ?

## 2021-04-25 LAB — CERVICOVAGINAL ANCILLARY ONLY
Bacterial Vaginitis (gardnerella): NEGATIVE
Candida Glabrata: NEGATIVE
Candida Vaginitis: NEGATIVE
Chlamydia: NEGATIVE
Comment: NEGATIVE
Comment: NEGATIVE
Comment: NEGATIVE
Comment: NEGATIVE
Comment: NEGATIVE
Comment: NORMAL
Neisseria Gonorrhea: NEGATIVE
Trichomonas: NEGATIVE

## 2021-04-26 NOTE — Progress Notes (Signed)
? ?   Contraception/Family Planning VISIT ENCOUNTER NOTE ? ?Subjective:  ? Jessica Mcpherson is a 36 y.o. G21P1021 female here for reproductive life counseling.  Desires something she can control and less cycles from [facility].  Reports she does not want a pregnancy in the next year. Denies abnormal vaginal bleeding, discharge, pelvic pain, problems with intercourse or other gynecologic concerns.  ?  ?She has had no unprotected sex in the past 2 weeks.  ? ?Gynecologic History ?Patient's last menstrual period was 04/06/2021 (exact date). ?Contraception: none ? ?Health Maintenance Due  ?Topic Date Due  ? TETANUS/TDAP  Never done  ? COVID-19 Vaccine (3 - Pfizer risk series) 12/01/2019  ? ? ? ?The following portions of the patient's history were reviewed and updated as appropriate: allergies, current medications, past family history, past medical history, past social history, past surgical history and problem list. ? ?Review of Systems ?Pertinent items are noted in HPI. ?  ?Objective:  ?BP 112/76   Pulse 82   Ht 5' (1.524 m)   Wt 154 lb (69.9 kg)   LMP 04/06/2021 (Exact Date)   BMI 30.08 kg/m?  ?Gen: well appearing, NAD ?HEENT: no scleral icterus ?CV: RR ?Lung: Normal WOB ?Ext: warm well perfused ? ?PELVIC: Normal appearing external genitalia; normal appearing vaginal mucosa and cervix.  No abnormal discharge noted.  Pap smear obtained.  Normal uterine size, no other palpable masses, no uterine or adnexal tenderness. ? ? ?Assessment and Plan:  ? ?Contraception counseling: Reviewed all forms of birth control options available including abstinence; over the counter/barrier methods; hormonal contraceptive medication including pill, patch, ring, injection,contraceptive implant; hormonal and nonhormonal IUDs; permanent sterilization options including vasectomy and the various tubal sterilization modalities. Risks and benefits reviewed.  Questions were answered.  Written information was also given to the patient to review.  Patient  desires Annovera, this was prescribed for patient. She will follow up in  34yrfor surveillance.  She was told to call with any further questions, or with any concerns about this method of contraception.  Emphasized use of condoms 100% of the time for STI prevention. ? ?1. Encounter for contraceptive management, unspecified type ?- Cervicovaginal ancillary only( [place]) ?- POCT urine pregnancy ?- Segesterone-Ethinyl Estradiol (ANNOVERA) 0.15-0.013 MG/24HR RING; Place 1 Units vaginally continuous. Use for 3 months and then have 1 week without ring when you have your cycle. We recommend 3-4 cycles per year.  Dispense: 1 each; Refill: 1 ? ?2. Encounter for initial prescription of vaginal ring hormonal contraceptive ?- Segesterone-Ethinyl Estradiol (ANNOVERA) 0.15-0.013 MG/24HR RING; Place 1 Units vaginally continuous. Use for 3 months and then have 1 week without ring when you have your cycle. We recommend 3-4 cycles per year.  Dispense: 1 each; Refill: 1 ? ?3. Papanicolaou smear of cervix with positive high risk human papilloma virus (HPV) test ?- Cytology - PAP( [place]) ?  ? ?Please refer to After Visit Summary for other counseling recommendations.  ? ?No follow-ups on file. ? ?KCaren Macadam MD, MPH, ABFM ?Attending Physician ?[facility]? ?

## 2021-04-29 LAB — CYTOLOGY - PAP
Comment: NEGATIVE
Diagnosis: NEGATIVE
High risk HPV: NEGATIVE

## 2021-05-02 MED ORDER — TRAZODONE HCL 100 MG PO TABS: 100.0000 mg | ORAL_TABLET | Freq: Every evening | ORAL | 1 refills | Status: DC | PRN

## 2021-05-14 ENCOUNTER — Encounter: Payer: Self-pay | Admitting: Internal Medicine

## 2021-05-21 ENCOUNTER — Encounter: Payer: Self-pay | Admitting: *Deleted

## 2021-05-21 NOTE — Progress Notes (Unsigned)
Jessica Daft, MD  [facility] M[facility] ?[place] for US guided left cervical lymph node biopsy.  Scan with [place]  and biopsy most suspicious.  ? ?[place]   ?  ?  ? ?

## 2021-05-24 ENCOUNTER — Ambulatory Visit (INDEPENDENT_AMBULATORY_CARE_PROVIDER_SITE_OTHER): Payer: 59 | Admitting: Psychology

## 2021-05-24 DIAGNOSIS — F320 Major depressive disorder, single episode, mild: Secondary | ICD-10-CM | POA: Diagnosis not present
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
## 2021-05-31 ENCOUNTER — Encounter: Payer: Self-pay | Admitting: Family Medicine

## 2021-05-31 ENCOUNTER — Ambulatory Visit (INDEPENDENT_AMBULATORY_CARE_PROVIDER_SITE_OTHER): Payer: 59 | Admitting: Psychology

## 2021-05-31 DIAGNOSIS — Z309 Encounter for contraceptive management, unspecified: Secondary | ICD-10-CM

## 2021-05-31 DIAGNOSIS — F320 Major depressive disorder, single episode, mild: Secondary | ICD-10-CM

## 2021-05-31 MED ORDER — ETONOGESTREL-ETHINYL ESTRADIOL 0.12-0.015 MG/24HR VA RING: VAGINAL_RING | VAGINAL | 4 refills | Status: DC

## 2021-05-31 NOTE — Progress Notes (Addendum)
[place] Counselor/Therapist Progress Note  Patient ID: Jessica Mcpherson, MRN: [id number],    Date: 05/31/2021  Time Spent: 30 mins  Treatment Type: Individual Therapy  Reported Symptoms: Pt presents for today's session, via webex video.  She states that she is in her apt with no one else present.  I shared with her that I am in my office at home with no one else here either.    Mental Status Exam: Appearance:  Casual     Behavior: Appropriate  Motor: Normal  Speech/Language:  Clear and Coherent  Affect: Appropriate  Mood: normal  Thought process: normal  Thought content:   WNL  Sensory/Perceptual disturbances:   WNL  Orientation: oriented to person, place, and time/date  Attention: Good  Concentration: Good  Memory: WNL  Fund of knowledge:  Good  Insight:   Good  Judgment:  Good  Impulse Control: Good   Risk Assessment: Danger to Self:  No Self-injurious Behavior: No Danger to Others: No Duty to Warn:no Physical Aggression / Violence:No  Access to Firearms a concern: No  Gang Involvement:No   Subjective: Pt shares that she "has been up and down in the past week.  I worked out every day except for today and that helped with my energy."  Pt shares she was able to work 3 days this week.  She is scheduled to work tomorrow as well.  She was not disciplined last week at work and is hopeful that  she will not be.  She is scheduled to work from 830am to 5pm, five days per week.  She is off every Sunday and her other day off rotates.  Pt shares that she has struggled with her mind worrying about things she can't control.  Pt shares she has re-started her Zoloft and has been taking it daily for the past week; shared with pt about the need to be consistent with it as it takes 4 wks to build up in her system.  Pt shares that she has been focusing getting back to work so she has not focused on self care.  She is planning to take her daughter bowling this weekend.  Her  daughter will be 23 yo next month; she will be graduating middle school this June and will be going to [employer] next year.  Pt shares that she continues to have difficulty sleeping but she is no longer napping.  Encouraged pt to continue avoiding the naps and to be as consistent with her going to bed routine at night.  She shares that she is using the deep breathing and that is helping her relax as well.  Pt shares that she has continued to talk with the guy she has been seeing; he is supportive of her and she appreciates that.  Encouraged pt to continue with her efforts at getting to work every day she is scheduled this next week and also continue practicing the deep breathing.  We will meet in 2 wks for a follow up session   Interventions: Cognitive Behavioral Therapy  Diagnosis:Current mild episode of major depressive disorder without prior episode [facility])  Plan: Treatment Plan Strengths/Abilities:  Intelligent, Intuitive, Willing to participate in therapy Treatment Preferences:  Outpatient Individual Therapy Statement of Needs:  Patient is to use CBT, mindfulness and coping skills to help manage and/or decrease symptoms associated with their diagnosis. Symptoms:  Depressed/Irritable mood, worry, social withdrawal Problems Addressed:  Depressive thoughts, Sadness, Sleep issues, etc. Long Term Goals:  Pt to reduce overall  level, frequency, and intensity of the feelings of depression as evidenced by decreased irritability, negative self talk, and helpless feelings from 6 to 7 days/week to 0 to 1 days/week, per client report, for at least 3 consecutive months.  Progress: 10% Short Term Goals:  Pt to verbally express understanding of the relationship between feelings of depression and their impact on thinking patterns and behaviors.  Pt to verbalize an understanding of the role that distorted thinking plays in creating fears, excessive worry, and ruminations.  Progress: 10% Target Date:   05/25/2022 Frequency:  Bi-weekly Modality:  Cognitive Behavioral Therapy Interventions by Therapist:  Therapist will use CBT, Mindfulness exercises, Coping skills and Referrals, as needed by client. Client has verbally approved this treatment plan.  Ivan Anchors, [facility]

## 2021-06-04 ENCOUNTER — Other Ambulatory Visit: Payer: Self-pay | Admitting: Radiology

## 2021-06-05 ENCOUNTER — Ambulatory Visit (HOSPITAL_COMMUNITY)
Admission: RE | Admit: 2021-06-05 | Discharge: 2021-06-05 | Disposition: A | Discharge status: Home / Self Care | Payer: 59 | Source: Ambulatory Visit | Attending: Internal Medicine | Admitting: Internal Medicine

## 2021-06-11 ENCOUNTER — Ambulatory Visit: Payer: 59 | Admitting: Family Medicine

## 2021-06-12 ENCOUNTER — Encounter: Payer: Self-pay | Admitting: Internal Medicine

## 2021-06-12 ENCOUNTER — Ambulatory Visit: Payer: 59 | Admitting: Internal Medicine

## 2021-06-12 VITALS — BP 124/70 | HR 79 | Resp 18 | Ht 60.0 in | Wt 154.8 lb

## 2021-06-12 DIAGNOSIS — C73 Malignant neoplasm of thyroid gland: Secondary | ICD-10-CM

## 2021-06-12 DIAGNOSIS — R55 Syncope and collapse: Secondary | ICD-10-CM | POA: Diagnosis not present

## 2021-06-12 LAB — COMPREHENSIVE METABOLIC PANEL
ALT: 14 U/L (ref 0–35)
AST: 16 U/L (ref 0–37)
Albumin: 4.1 g/dL (ref 3.5–5.2)
Alkaline Phosphatase: 47 U/L (ref 39–117)
BUN: 15 mg/dL (ref 6–23)
CO2: 21 mEq/L (ref 19–32)
Calcium: 9.4 mg/dL (ref 8.4–10.5)
Chloride: 108 mEq/L (ref 96–112)
Creatinine, Ser: 0.81 mg/dL (ref 0.40–1.20)
GFR: 94.45 mL/min (ref >60.00)
Glucose, Bld: 108 mg/dL — ABNORMAL HIGH (ref 70–99)
Potassium: 4.2 mEq/L (ref 3.5–5.1)
Sodium: 138 mEq/L (ref 135–145)
Total Bilirubin: 0.2 mg/dL (ref 0.2–1.2)
Total Protein: 7.4 g/dL (ref 6.0–8.3)

## 2021-06-12 LAB — CBC
HCT: 39.3 % (ref 36.0–46.0)
Hemoglobin: 13.1 g/dL (ref 12.0–15.0)
MCHC: 33.4 g/dL (ref 30.0–36.0)
MCV: 89.3 fl (ref 78.0–100.0)
Platelets: 264 10*3/uL (ref 150.0–400.0)
RBC: 4.4 Mil/uL (ref 3.87–5.11)
RDW: 13.7 % (ref 11.5–15.5)
WBC: 9.3 10*3/uL (ref 4.0–10.5)

## 2021-06-12 LAB — FERRITIN: Ferritin: 13.6 ng/mL (ref 10.0–291.0)

## 2021-06-12 LAB — VITAMIN D 25 HYDROXY (VIT D DEFICIENCY, FRACTURES): VITD: 14.97 ng/mL — ABNORMAL LOW (ref 30.00–100.00)

## 2021-06-12 LAB — VITAMIN B12: Vitamin B-12: 536 pg/mL (ref 211–911)

## 2021-06-12 LAB — T4, FREE: Free T4: 0.87 ng/dL (ref 0.60–1.60)

## 2021-06-12 LAB — TSH: TSH: 3.93 u[IU]/mL (ref 0.35–5.50)

## 2021-06-12 NOTE — Progress Notes (Unsigned)
   Subjective:   Patient ID: Jessica Mcpherson, female    DOB: 1985-01-26, 36 y.o.   MRN: [id number]  HPI The patient is a 36 YO female coming in for syncope. Getting biopsy on neck area soon due to prior thyroid cancer and change in US findings. Over the weekend dizziness and lightheadedness no clear trigger. No sick no URI or GI symptoms.   Review of Systems  Constitutional: Negative.   HENT: Negative.    Eyes: Negative.   Respiratory:  Negative for cough, chest tightness and shortness of breath.   Cardiovascular:  Negative for chest pain, palpitations and leg swelling.  Gastrointestinal:  Negative for abdominal distention, abdominal pain, constipation, diarrhea, nausea and vomiting.  Musculoskeletal: Negative.   Skin: Negative.   Neurological:  Positive for syncope and headaches.  Psychiatric/Behavioral:  Positive for sleep disturbance. The patient is nervous/anxious.    Objective:  Physical Exam Constitutional:      Appearance: She is well-developed.  HENT:     Head: Normocephalic and atraumatic.  Cardiovascular:     Rate and Rhythm: Normal rate and regular rhythm.  Pulmonary:     Effort: Pulmonary effort is normal. No respiratory distress.     Breath sounds: Normal breath sounds. No wheezing or rales.  Abdominal:     General: Bowel sounds are normal. There is no distension.     Palpations: Abdomen is soft.     Tenderness: There is no abdominal tenderness. There is no rebound.  Musculoskeletal:     Cervical back: Normal range of motion.  Skin:    General: Skin is warm and dry.  Neurological:     Mental Status: She is alert and oriented to person, place, and time.     Coordination: Coordination normal.    Vitals:   06/12/21 0859  BP: 124/70  Pulse: 79  Resp: 18  SpO2: 99%  Weight: 154 lb 12.8 oz (70.2 kg)  Height: 5' (1.524 m)   EKG: Rate 70, axis normal, interval normal, sinus, no st or t wave changes, no significant change compared to prior 2022   Assessment &  Plan:

## 2021-06-13 ENCOUNTER — Other Ambulatory Visit: Payer: Self-pay | Admitting: Internal Medicine

## 2021-06-13 DIAGNOSIS — R55 Syncope and collapse: Secondary | ICD-10-CM | POA: Insufficient documentation

## 2021-06-13 DIAGNOSIS — E890 Postprocedural hypothyroidism: Secondary | ICD-10-CM

## 2021-06-13 MED ORDER — VITAMIN D (ERGOCALCIFEROL) 1.25 MG (50000 UNIT) PO CAPS: 50000.0000 [IU] | ORAL_CAPSULE | ORAL | 3 refills | Status: AC

## 2021-06-13 NOTE — Assessment & Plan Note (Addendum)
She has had 2 prior syncope prior to this one. This one was without trigger not feeling lightheaded at the time. She does have LAD in the neck near carotid so could be related to pressure on the carotid sinus. EKG done today which is normal. Checking CBC, CMP, B12, vitamin D, TSH , free T4, ferritin. If no findings will consult with her endocrinologist if they think the LAD could be related. Advised to stay hydrated and stand slowly. No orthostatic symptoms in office. She did also change birth control brands prior to this event which could be related.

## 2021-06-13 NOTE — Assessment & Plan Note (Signed)
Checking TSH and free T4 to rule out thyroid abnormality.

## 2021-06-14 ENCOUNTER — Encounter: Payer: 59 | Admitting: Psychology

## 2021-06-14 NOTE — Progress Notes (Signed)
                Jessica Mcpherson Jessica Mcpherson, LCMHC This encounter was created in error - please disregard. 

## 2021-06-17 ENCOUNTER — Encounter: Payer: Self-pay | Admitting: Internal Medicine

## 2021-06-19 ENCOUNTER — Other Ambulatory Visit (HOSPITAL_COMMUNITY): Payer: Self-pay | Admitting: Physician Assistant

## 2021-06-20 ENCOUNTER — Other Ambulatory Visit: Payer: Self-pay | Admitting: Radiology

## 2021-06-21 ENCOUNTER — Ambulatory Visit (HOSPITAL_COMMUNITY)
Admission: RE | Admit: 2021-06-21 | Discharge: 2021-06-21 | Disposition: A | Discharge status: Home / Self Care | Payer: 59 | Source: Ambulatory Visit | Attending: Internal Medicine | Admitting: Internal Medicine

## 2021-06-21 ENCOUNTER — Ambulatory Visit: Payer: 59 | Admitting: Psychology

## 2021-07-09 ENCOUNTER — Ambulatory Visit: Payer: 59 | Admitting: Internal Medicine

## 2021-07-09 ENCOUNTER — Encounter: Payer: Self-pay | Admitting: Internal Medicine

## 2021-07-14 ENCOUNTER — Other Ambulatory Visit: Payer: Self-pay | Admitting: Internal Medicine

## 2021-07-14 DIAGNOSIS — E890 Postprocedural hypothyroidism: Secondary | ICD-10-CM

## 2021-07-18 ENCOUNTER — Ambulatory Visit: Payer: 59 | Admitting: Internal Medicine

## 2021-07-18 ENCOUNTER — Encounter: Payer: Self-pay | Admitting: Internal Medicine

## 2021-07-19 ENCOUNTER — Other Ambulatory Visit: Payer: Self-pay | Admitting: Radiology

## 2021-07-21 NOTE — H&P (Incomplete)
Chief Complaint: Patient was seen in consultation today for left neck mass at the request of [place]  Referring Physician(s): Gutierrez  Supervising Physician: Jacqulynn Cadet  Patient Status: [facility] - Out-pt  History of Present Illness: Jessica Mcpherson is a 36 y.o. female with past medical history of GERD, HA, HTN and papillary thyroid carcinoma.  Patient presented to urgent care March 2021 complaining of neck fullness with shoulder pain.  Patient underwent thyroid ultrasound 03/24/2019 that showed solid left mid hypoechoic thyroid nodule suspicious for PTC.  Patient underwent total thyroidectomy with central lymph node dissection on 07/22/2019 after being diagnosed with papillary thyroid carcinoma.  Patient had 1 year follow-up thyroid ultrasound 03/01/2021 that showed soft tissue within the left neck near surgical thyroid bed suspicious for residual/recurrent tissue.  CT scan performed 03/21/2021 showed enlarged lymph nodes in the left jugular chain suspicious for metastatic nodes.  Patient was referred to IR by Dr. Benjiman Core for left neck mass biopsy.  Dr. Anselm Pancoast approved left cervical lymph node biopsy.  Past Medical History:  Diagnosis Date   Anxiety    Arthritis    Right knee   Depression    GERD (gastroesophageal reflux disease)    Headache    Hypertension    Miscarriage    x2   Papillary thyroid carcinoma ([place])     Past Surgical History:  Procedure Laterality Date   CESAREAN SECTION     LAPAROSCOPIC APPENDECTOMY N/A 12/30/2013   Procedure: APPENDECTOMY LAPAROSCOPIC;  Surgeon: Jackolyn Confer, MD;  Location: [facility] ORS;  Service: General;  Laterality: N/A;   THYROIDECTOMY N/A 07/22/2019   Procedure: TOTAL THYROIDECTOMY WITH CENTRAL COMPARTMENT LYMPH NODE DISSECTION ZONE VI;  Surgeon: Armandina Gemma, MD;  Location: [facility] ORS;  Service: General;  Laterality: N/A;    Allergies: Penicillins and Pepperoni [pickled meat]  Medications: Prior to Admission medications    Medication Sig Start Date End Date Taking? Authorizing Provider  acetaminophen (TYLENOL) 500 MG tablet Take 1,000 mg by mouth every 6 (six) hours as needed for moderate pain.    [provider]  etonogestrel-ethinyl estradiol (NUVARING) 0.12-0.015 MG/24HR vaginal ring Insert vaginally and leave in place for 3 consecutive weeks, then remove for 1 week. 05/31/21   Caren Macadam, MD  levothyroxine (SYNTHROID) 137 MCG tablet TAKE 1 TABLET BY MOUTH ONCE DAILY BEFORE BREAKFAST 07/15/21   Philemon Kingdom, MD  Multiple Vitamin (MULTIVITAMIN WITH MINERALS) TABS tablet Take 1 tablet by mouth daily.    [provider]  sertraline (ZOLOFT) 25 MG tablet Take 1 tablet (25 mg total) by mouth daily. Patient not taking: Reported on 06/18/2021 02/04/21   Revonda Humphrey, NP  traZODone (DESYREL) 100 MG tablet Take 1-2 tablets (100-200 mg total) by mouth at bedtime as needed for sleep. Patient not taking: Reported on 06/18/2021 05/02/21   Hoyt Koch, MD  traZODone (DESYREL) 50 MG tablet Take 100 mg by mouth at bedtime as needed for sleep.    [provider]  Vitamin D, Ergocalciferol, (DRISDOL) 1.25 MG (50000 UNIT) CAPS capsule Take 1 capsule (50,000 Units total) by mouth every 7 (seven) days. 06/13/21   Hoyt Koch, MD     Family History  Problem Relation Age of Onset   Diabetes Mother    Hypertension Mother    Asthma Mother    Diabetes Maternal Grandmother    Hypertension Maternal Grandmother    Arthritis Maternal Grandmother    Heart disease Maternal Grandmother    Diabetes Maternal Grandfather  Arthritis Maternal Grandfather    Heart disease Maternal Grandfather    Hypertension Father     Social History   Socioeconomic History   Marital status: Single    Spouse name: Not on file   Number of children: 1   Years of education: Not on file   Highest education level: Not on file  Occupational History   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  Tobacco Use    Smoking status: Every Day    Packs/day: 0.10    Types: Cigarettes   Smokeless tobacco: Never  Vaping Use   Vaping Use: Never used  Substance and Sexual Activity   Alcohol use: No    Alcohol/week: 0.0 standard drinks of alcohol   Drug use: No   Sexual activity: Yes    Partners: Male    Birth control/protection: None  Other Topics Concern   Not on file  Social History Narrative   Not on file   Social Determinants of Health   Financial Resource Strain: Not on file  Food Insecurity: Not on file  Transportation Needs: Not on file  Physical Activity: Not on file  Stress: Not on file  Social Connections: Not on file    ECOG Status: {CHL ONC ECOG GB:[id number]}  Review of Systems: A 12 point ROS discussed and pertinent positives are indicated in the HPI above.  All other systems are negative.  Review of Systems  Vital Signs: There were no vitals taken for this visit.    Physical Exam  Imaging: No results found.  Labs:  CBC: Recent Labs    11/07/20 1003 06/12/21 1001  WBC 14.1* 9.3  HGB 13.8 13.1  HCT 42.1 39.3  PLT 265.0 264.0    COAGS: No results for input(s): "INR", "APTT" in the last 8760 hours.  BMP: Recent Labs    11/07/20 1003 06/12/21 1001  NA 136 138  K 4.4 4.2  CL 104 108  CO2 25 21  GLUCOSE 101* 108*  BUN 10 15  CALCIUM 9.6 9.4  CREATININE 0.80 0.81    LIVER FUNCTION TESTS: Recent Labs    11/07/20 1003 06/12/21 1001  BILITOT 0.4 0.2  AST 28 16  ALT 31 14  ALKPHOS 83 47  PROT 8.1 7.4  ALBUMIN 4.4 4.1    TUMOR MARKERS: No results for input(s): "AFPTM", "CEA", "CA199", "CHROMGRNA" in the last 8760 hours.  Assessment and Plan:  Risks and benefits of biopsy of left neck mass with moderate sedation was discussed with the patient and/or patient's family including, but not limited to bleeding, infection, damage to adjacent structures or low yield requiring additional tests.  All of the questions were answered and there is  agreement to proceed.  Consent signed and in chart.   Thank you for this interesting consult.  I greatly enjoyed meeting Jessica Mcpherson and look forward to participating in their care.  A copy of this report was sent to the requesting provider on this date.  Electronically Signed: Tyson Alias, NP 07/21/2021, 6:24 PM   I spent a total of {New XTGG:[id number]} {New Out-Pt:[id number]}  {Established Out-Pt:[id number]} in face to face in clinical consultation, greater than 50% of which was counseling/coordinating care for ***

## 2021-07-22 ENCOUNTER — Ambulatory Visit (HOSPITAL_COMMUNITY): Payer: 59

## 2021-07-22 ENCOUNTER — Ambulatory Visit (HOSPITAL_COMMUNITY): Admission: RE | Admit: 2021-07-22 | Payer: 59 | Source: Ambulatory Visit

## 2021-07-24 ENCOUNTER — Other Ambulatory Visit (INDEPENDENT_AMBULATORY_CARE_PROVIDER_SITE_OTHER): Payer: 59

## 2021-07-24 ENCOUNTER — Encounter: Payer: Self-pay | Admitting: Internal Medicine

## 2021-07-24 DIAGNOSIS — E890 Postprocedural hypothyroidism: Secondary | ICD-10-CM | POA: Diagnosis not present

## 2021-07-24 LAB — TSH: TSH: 1.79 u[IU]/mL (ref 0.35–5.50)

## 2021-07-24 LAB — T4, FREE: Free T4: 1.1 ng/dL (ref 0.60–1.60)

## 2021-07-26 ENCOUNTER — Ambulatory Visit (INDEPENDENT_AMBULATORY_CARE_PROVIDER_SITE_OTHER): Payer: 59

## 2021-07-26 ENCOUNTER — Ambulatory Visit
Admission: EM | Admit: 2021-07-26 | Discharge: 2021-07-26 | Disposition: A | Discharge status: Home / Self Care | Payer: 59 | Attending: Student | Admitting: Student

## 2021-07-26 DIAGNOSIS — S83412A Sprain of medial collateral ligament of left knee, initial encounter: Secondary | ICD-10-CM | POA: Diagnosis not present

## 2021-07-26 DIAGNOSIS — M25562 Pain in left knee: Secondary | ICD-10-CM | POA: Diagnosis not present

## 2021-07-26 NOTE — ED Provider Notes (Signed)
[facility]    CSN: [id number] Arrival date & time: 07/26/21  0908      History   Chief Complaint Chief Complaint  Patient presents with   Knee Pain    HPI Jessica Mcpherson is a 36 y.o. female presenting with L knee pain x1 week following new workout/hearing a pop. History R knee arthritis. States heard a pop and soon felt pain over the medial aspect, worse with movement. Symptoms improved following ice and elevation. Denies radiation of pain or movement of pain. OCP contraception but no other risk factors for DVT. Denies recent travel, prolonged immobilization, recent surgery, recent trauma, history of clots, history of DVT, history of PE, smoking.     HPI  Past Medical History:  Diagnosis Date   Anxiety    Arthritis    Right knee   Depression    GERD (gastroesophageal reflux disease)    Headache    Hypertension    Miscarriage    x2   Papillary thyroid carcinoma [facility])     Patient Active Problem List   Diagnosis Date Noted   Syncope 06/13/2021   Insomnia 11/07/2020   Anxiety 05/03/2020   Morbid obesity ([place]) 05/03/2020   Ear itching 12/27/2019   Gonorrhea 12/26/2019   Papanicolaou smear of cervix with positive high risk human papilloma virus (HPV) test 12/26/2019   Other fatigue 06/18/2019   Papillary thyroid carcinoma ([place]) 05/03/2019   Essential hypertension 05/03/2019   Tobacco abuse 05/03/2019   Depression 05/03/2019    Past Surgical History:  Procedure Laterality Date   CESAREAN SECTION     LAPAROSCOPIC APPENDECTOMY N/A 12/30/2013   Procedure: APPENDECTOMY LAPAROSCOPIC;  Surgeon: Jackolyn Confer, MD;  Location: [facility];  Service: General;  Laterality: N/A;   THYROIDECTOMY N/A 07/22/2019   Procedure: TOTAL THYROIDECTOMY WITH CENTRAL COMPARTMENT LYMPH NODE DISSECTION ZONE VI;  Surgeon: Armandina Gemma, MD;  Location: [facility];  Service: General;  Laterality: N/A;    OB History     Gravida  5   Para  1   Term  1   Preterm      AB  2    Living  1      SAB  1   IAB      Ectopic      Multiple      Live Births  1            Home Medications    Prior to Admission medications   Medication Sig Start Date End Date Taking? Authorizing Provider  acetaminophen (TYLENOL) 500 MG tablet Take 1,000 mg by mouth every 6 (six) hours as needed for moderate pain.    [provider]  etonogestrel-ethinyl estradiol (NUVARING) 0.12-0.015 MG/24HR vaginal ring Insert vaginally and leave in place for 3 consecutive weeks, then remove for 1 week. 05/31/21   Caren Macadam, MD  levothyroxine (SYNTHROID) 137 MCG tablet TAKE 1 TABLET BY MOUTH ONCE DAILY BEFORE BREAKFAST 07/15/21   Philemon Kingdom, MD  Multiple Vitamin (MULTIVITAMIN WITH MINERALS) TABS tablet Take 1 tablet by mouth daily.    [provider]  traZODone (DESYREL) 50 MG tablet Take 100 mg by mouth at bedtime as needed for sleep.    [provider]  Vitamin D, Ergocalciferol, (DRISDOL) 1.25 MG (50000 UNIT) CAPS capsule Take 1 capsule (50,000 Units total) by mouth every 7 (seven) days. 06/13/21   Hoyt Koch, MD    Family History Family History  Problem Relation Age of Onset  Diabetes Mother    Hypertension Mother    Asthma Mother    Diabetes Maternal Grandmother    Hypertension Maternal Grandmother    Arthritis Maternal Grandmother    Heart disease Maternal Grandmother    Diabetes Maternal Grandfather    Arthritis Maternal Grandfather    Heart disease Maternal Grandfather    Hypertension Father     Social History Social History   Tobacco Use   Smoking status: Every Day    Packs/day: 0.10    Types: Cigarettes   Smokeless tobacco: Never  Vaping Use   Vaping Use: Never used  Substance Use Topics   Alcohol use: No    Alcohol/week: 0.0 standard drinks of alcohol   Drug use: No     Allergies   Penicillins and Pepperoni [pickled meat]   Review of Systems Review of Systems  Musculoskeletal:        L knee pain    All other systems reviewed and are negative.    Physical Exam Triage Vital Signs ED Triage Vitals  Enc Vitals Group     BP 07/26/21 1003 135/89     Pulse Rate 07/26/21 1003 75     Resp 07/26/21 1003 18     Temp 07/26/21 1003 98.3 F (36.8 C)     Temp Source 07/26/21 1003 Oral     SpO2 07/26/21 1003 97 %     Weight --      Height --      Head Circumference --      Peak Flow --      Pain Score 07/26/21 1002 8     Pain Loc --      Pain Edu? --      Excl. in [place]? --    No data found.  Updated Vital Signs BP 135/89 (BP Location: Left Arm)   Pulse 75   Temp 98.3 F (36.8 C) (Oral)   Resp 18   SpO2 97%   Visual Acuity Right Eye Distance:   Left Eye Distance:   Bilateral Distance:    Right Eye Near:   Left Eye Near:    Bilateral Near:     Physical Exam Vitals reviewed.  Constitutional:      General: She is not in acute distress.    Appearance: Normal appearance. She is not ill-appearing.  HENT:     Head: Normocephalic and atraumatic.  Pulmonary:     Effort: Pulmonary effort is normal.  Musculoskeletal:     Comments: L knee - no skin changes or swelling. TTP MCL. Pain elicited with flexion and extension, but no crepitus or laxity. Knees are 36cm and symmetric. No calf or thigh distension. DP 2+.  Neurological:     General: No focal deficit present.     Mental Status: She is alert and oriented to person, place, and time.  Psychiatric:        Mood and Affect: Mood normal.        Behavior: Behavior normal.        Thought Content: Thought content normal.        Judgment: Judgment normal.      UC Treatments / Results  Labs (all labs ordered are listed, but only abnormal results are displayed) Labs Reviewed - No data to display  EKG   Radiology DG Knee Complete 4 Views Left  Result Date: 07/26/2021 CLINICAL DATA:  Medial LEFT knee pain for 1 week after hearing a pop EXAM: LEFT KNEE - COMPLETE 4+ VIEW COMPARISON:  None FINDINGS: Osseous mineralization  normal. Joint spaces preserved. No acute fracture, dislocation, or bone destruction. No joint effusion. IMPRESSION: No acute abnormalities. Electronically Signed   By: Lavonia Dana M.D.   On: 07/26/2021 10:55    Procedures Procedures (including critical care time)  Medications Ordered in UC Medications - No data to display  Initial Impression / Assessment and Plan / UC Course  I have reviewed the triage vital signs and the nursing notes.  Pertinent labs & imaging results that were available during my care of the patient were reviewed by me and considered in my medical decision making (see chart for details).     This patient is a very pleasant 36 y.o. year old female presenting with L knee pain following working/ hearing a "pop". There is some tenderness medial L knee. Neurovascularly intact.   OCP contraception, otherwise low risk for DVT. Symptoms began after acute injury. Knees are equal and symmetric and there is no unilateral leg/calf swelling or venous distension. Wells score DVT -2.   Xray L knee - No acute abnormalities.  RICE. F/u with [facility] if symptoms persist   Work note provided. ED return precautions discussed. Patient verbalizes understanding and agreement.    Final Clinical Impressions(s) / UC Diagnoses   Final diagnoses:  Sprain of medial collateral ligament of left knee, initial encounter     Discharge Instructions      -Your xray looks normal today, which means that you most likely sprained the knee.  -Rest, ice, tylenol/ibuprofen, elevation  -You can take Tylenol up to 1000 mg 3 times daily, and ibuprofen up to 600 mg 3 times daily with food.  You can take these together, or alternate every 3-4 hours. -If symptoms persist in 5 days, follow-up with an orthopedist. I recommend [facility] at [address] [place], [place] [address]. You can schedule an appointment by calling [phone]) or online ([email or web address]), but they also have a walk-in  clinic M-F 8a-8p and Sat 10a-3p.      ED Prescriptions   None    PDMP not reviewed this encounter.   Thu, [place], PA-C 07/26/21 1132

## 2021-07-26 NOTE — Discharge Instructions (Addendum)
-  Your xray looks normal today, which means that you most likely sprained the knee.  -Rest, ice, tylenol/ibuprofen, elevation  -You can take Tylenol up to 1000 mg 3 times daily, and ibuprofen up to 600 mg 3 times daily with food.  You can take these together, or alternate every 3-4 hours. -If symptoms persist in 5 days, follow-up with an orthopedist. I recommend [facility] at [address] [place], [place] [address]. You can schedule an appointment by calling [phone]) or online ([email or web address]), but they also have a walk-in clinic M-F 8a-8p and Sat 10a-3p.

## 2021-07-26 NOTE — ED Triage Notes (Signed)
Pt present left knee pain, symptoms started a week ago. Pt states she been working out and possible pulled a muscle.

## 2021-07-31 ENCOUNTER — Encounter: Payer: Self-pay | Admitting: Internal Medicine

## 2021-08-06 NOTE — Patient Instructions (Addendum)
Pt not seen.

## 2021-08-12 ENCOUNTER — Other Ambulatory Visit: Payer: Self-pay | Admitting: Radiology

## 2021-08-12 DIAGNOSIS — C73 Malignant neoplasm of thyroid gland: Secondary | ICD-10-CM

## 2021-08-13 ENCOUNTER — Ambulatory Visit (HOSPITAL_COMMUNITY): Payer: 59 | Attending: Internal Medicine

## 2021-08-13 ENCOUNTER — Ambulatory Visit (HOSPITAL_COMMUNITY): Admission: RE | Admit: 2021-08-13 | Payer: 59 | Source: Ambulatory Visit

## 2021-09-01 ENCOUNTER — Other Ambulatory Visit: Payer: Self-pay | Admitting: Internal Medicine

## 2021-09-10 ENCOUNTER — Emergency Department (HOSPITAL_COMMUNITY)
Admission: EM | Admit: 2021-09-10 | Discharge: 2021-09-10 | Disposition: A | Discharge status: Home / Self Care | Payer: 59 | Attending: Student | Admitting: Student

## 2021-09-10 ENCOUNTER — Encounter (HOSPITAL_COMMUNITY): Payer: Self-pay

## 2021-09-10 ENCOUNTER — Other Ambulatory Visit: Payer: Self-pay

## 2021-09-10 ENCOUNTER — Emergency Department (HOSPITAL_COMMUNITY): Payer: 59

## 2021-09-10 DIAGNOSIS — W231XXA Caught, crushed, jammed, or pinched between stationary objects, initial encounter: Secondary | ICD-10-CM | POA: Diagnosis not present

## 2021-09-10 DIAGNOSIS — M7989 Other specified soft tissue disorders: Secondary | ICD-10-CM | POA: Diagnosis not present

## 2021-09-10 DIAGNOSIS — I10 Essential (primary) hypertension: Secondary | ICD-10-CM | POA: Diagnosis not present

## 2021-09-10 DIAGNOSIS — Y990 Civilian activity done for income or pay: Secondary | ICD-10-CM | POA: Insufficient documentation

## 2021-09-10 DIAGNOSIS — S6991XA Unspecified injury of right wrist, hand and finger(s), initial encounter: Secondary | ICD-10-CM | POA: Diagnosis present

## 2021-09-10 MED ORDER — IBUPROFEN 800 MG PO TABS
800.0000 mg | ORAL_TABLET | Freq: Once | ORAL | Status: AC
Start: 2021-09-10 — End: 2021-09-10
  Administered 2021-09-10: 800 mg via ORAL
  Filled 2021-09-10: qty 1

## 2021-09-10 NOTE — Discharge Instructions (Signed)
Return to the ED with any new or worsening signs or symptoms Follow-up with your PCP for ongoing needs Continue icing her finger.  You may also utilize heat pads. Please utilize the RICE method.  This is rest, ice, compress, elevate.  This will help to alleviate any discomfort. Please continue taking ibuprofen/Tylenol

## 2021-09-10 NOTE — ED Triage Notes (Signed)
Pt smashed her R middle finger in a mailbox while working on Saturday. Pt endorses finger pain and swelling.

## 2021-09-10 NOTE — ED Provider Notes (Signed)
[facility] DEPT Provider Note   CSN: [id number] Arrival date & time: 09/10/21  4128     History  Chief Complaint  Patient presents with   Finger Injury    Jessica Mcpherson is a 36 y.o. female with medical history of anxiety, arthritis, depression, headaches, hypertension.  Patient presents to ED for evaluation of finger injury.  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  Patient states that since this time she has been having progressively worsening pain.  The patient states has been taking ibuprofen with slight relief of pain.  Patient denies any numbness or tingling, loss of function of the finger.  Patient does endorse generalized swelling.  HPI     Home Medications Prior to Admission medications   Medication Sig Start Date End Date Taking? Authorizing Provider  acetaminophen (TYLENOL) 500 MG tablet Take 1,000 mg by mouth every 6 (six) hours as needed for moderate pain.    [provider]  etonogestrel-ethinyl estradiol (NUVARING) 0.12-0.015 MG/24HR vaginal ring Insert vaginally and leave in place for 3 consecutive weeks, then remove for 1 week. 05/31/21   Caren Macadam, MD  levothyroxine (SYNTHROID) 137 MCG tablet TAKE 1 TABLET BY MOUTH ONCE DAILY BEFORE BREAKFAST 07/15/21   Philemon Kingdom, MD  Multiple Vitamin (MULTIVITAMIN WITH MINERALS) TABS tablet Take 1 tablet by mouth daily.    [provider]  traZODone (DESYREL) 50 MG tablet Take 100 mg by mouth at bedtime as needed for sleep.    [provider]  Vitamin D, Ergocalciferol, (DRISDOL) 1.25 MG (50000 UNIT) CAPS capsule Take 1 capsule (50,000 Units total) by mouth every 7 (seven) days. 06/13/21   Hoyt Koch, MD      Allergies    Penicillins and Pepperoni [pickled meat]    Review of Systems   Review of Systems  Musculoskeletal:  Positive for  arthralgias and myalgias.  All other systems reviewed and are negative.   Physical Exam Updated Vital Signs BP (!) 122/93 (BP Location: Left Arm)   Pulse 79   Temp 98.7 F (37.1 C) (Oral)   Resp 16   SpO2 96%  Physical Exam Vitals and nursing note reviewed.  Constitutional:      General: She is not in acute distress.    Appearance: Normal appearance. She is not ill-appearing, toxic-appearing or diaphoretic.  HENT:     Head: Normocephalic and atraumatic.     Nose: Nose normal. No congestion.     Mouth/Throat:     Mouth: Mucous membranes are moist.     Pharynx: Oropharynx is clear.  Eyes:     Extraocular Movements: Extraocular movements intact.     Conjunctiva/sclera: Conjunctivae normal.     Pupils: Pupils are equal, round, and reactive to light.  Cardiovascular:     Rate and Rhythm: Normal rate and regular rhythm.  Pulmonary:     Effort: Pulmonary effort is normal.     Breath sounds: Normal breath sounds. No wheezing.  Abdominal:     General: Abdomen is flat. Bowel sounds are normal.     Palpations: Abdomen is soft.     Tenderness: There is no abdominal tenderness.  Musculoskeletal:     Cervical back: Normal range of motion and neck supple. No tenderness.     Comments: Patient with soft tissue swelling about the middle finger, no deformity noted, no erythema  or overlying skin change.  Patient tells ability to flex, extend the finger.  Patient able to actively resist, doubt mallet finger   Skin:    General: Skin is warm and dry.     Capillary Refill: Capillary refill takes less than 2 seconds.  Neurological:     Mental Status: She is alert and oriented to person, place, and time.     ED Results / Procedures / Treatments   Labs (all labs ordered are listed, but only abnormal results are displayed) Labs Reviewed - No data to display  EKG None  Radiology DG Finger Middle Right  Result Date: 09/10/2021 CLINICAL DATA:  Closed right middle finger and mailbox. Pain.  Right middle finger PIP joint pain. EXAM: RIGHT MIDDLE FINGER 2+V COMPARISON:  None Available. FINDINGS: Normal bone mineralization. Joint spaces are preserved. No acute fracture or dislocation. The cortices are intact. IMPRESSION: Normal right middle finger radiographs. Electronically Signed   By: Yvonne Kendall M.D.   On: 09/10/2021 10:00    Procedures Procedures   Medications Ordered in ED Medications  ibuprofen (ADVIL) tablet 800 mg (has no administration in time range)    ED Course/ Medical Decision Making/ A&P                           Medical Decision Making Amount and/or Complexity of Data Reviewed Radiology: ordered.  Risk Prescription drug management.   36 year old female presents to ED for evaluation.  Please see HPI for further details.  On examination the patient right middle finger does have soft tissue swelling without overlying skin change.  There is no deformity.  The patient has the ability to flex and extend the finger actively, she can also resist against me trying to pull apart her fingers which leads me to believe that she does not have any ligamentous damage.  Patient denies any numbness or tingling of the finger, she is neurovascularly intact.  Plain film imaging of patient right middle finger does not show any fractures, dislocation.  The patient will have her finger buddy taped and then be advised to continue icing her finger as well as utilizing ibuprofen/Tylenol.    The patient was counseled on RICE method and she voiced understanding.  The patient was advised to follow-up with her PCP for any ongoing needs.  Patient was given return precautions and she voiced understanding.  The patient had all of her questions answered to her satisfaction.  The patient is stable at this time for discharge home.  Final Clinical Impression(s) / ED Diagnoses Final diagnoses:  Injury of finger of right hand, initial encounter    Rx / DC Orders ED Discharge Orders     None          Lawana Chambers 09/10/21 1256    Kommor, Debe Coder, MD 09/11/21 1019

## 2021-09-13 ENCOUNTER — Ambulatory Visit: Payer: 59 | Admitting: Internal Medicine

## 2021-09-23 ENCOUNTER — Ambulatory Visit: Payer: 59 | Admitting: Internal Medicine

## 2021-10-28 ENCOUNTER — Ambulatory Visit: Payer: 59 | Admitting: Internal Medicine

## 2021-10-28 VITALS — BP 120/78 | HR 73 | Temp 98.3°F | Ht 60.0 in | Wt 160.2 lb

## 2021-10-28 DIAGNOSIS — Z720 Tobacco use: Secondary | ICD-10-CM

## 2021-10-28 DIAGNOSIS — I10 Essential (primary) hypertension: Secondary | ICD-10-CM | POA: Diagnosis not present

## 2021-10-28 DIAGNOSIS — R1030 Lower abdominal pain, unspecified: Secondary | ICD-10-CM | POA: Diagnosis not present

## 2021-10-28 DIAGNOSIS — R350 Frequency of micturition: Secondary | ICD-10-CM

## 2021-10-28 DIAGNOSIS — R109 Unspecified abdominal pain: Secondary | ICD-10-CM | POA: Insufficient documentation

## 2021-10-28 LAB — CBC WITH DIFFERENTIAL/PLATELET
Basophils Absolute: 0.1 10*3/uL (ref 0.0–0.1)
Basophils Relative: 1.1 % (ref 0.0–3.0)
Eosinophils Absolute: 0.2 10*3/uL (ref 0.0–0.7)
Eosinophils Relative: 1.7 % (ref 0.0–5.0)
HCT: 38.7 % (ref 36.0–46.0)
Hemoglobin: 12.9 g/dL (ref 12.0–15.0)
Lymphocytes Relative: 32.4 % (ref 12.0–46.0)
Lymphs Abs: 3.1 10*3/uL (ref 0.7–4.0)
MCHC: 33.5 g/dL (ref 30.0–36.0)
MCV: 89.1 fl (ref 78.0–100.0)
Monocytes Absolute: 0.6 10*3/uL (ref 0.1–1.0)
Monocytes Relative: 5.9 % (ref 3.0–12.0)
Neutro Abs: 5.6 10*3/uL (ref 1.4–7.7)
Neutrophils Relative %: 58.9 % (ref 43.0–77.0)
Platelets: 262 10*3/uL (ref 150.0–400.0)
RBC: 4.34 Mil/uL (ref 3.87–5.11)
RDW: 13.4 % (ref 11.5–15.5)
WBC: 9.6 10*3/uL (ref 4.0–10.5)

## 2021-10-28 LAB — BASIC METABOLIC PANEL
BUN: 14 mg/dL (ref 6–23)
CO2: 23 mEq/L (ref 19–32)
Calcium: 9.1 mg/dL (ref 8.4–10.5)
Chloride: 105 mEq/L (ref 96–112)
Creatinine, Ser: 0.7 mg/dL (ref 0.40–1.20)
GFR: 112.23 mL/min (ref >60.00)
Glucose, Bld: 73 mg/dL (ref 70–99)
Potassium: 4.2 mEq/L (ref 3.5–5.1)
Sodium: 136 mEq/L (ref 135–145)

## 2021-10-28 LAB — HEPATIC FUNCTION PANEL
ALT: 13 U/L (ref 0–35)
AST: 13 U/L (ref 0–37)
Albumin: 4.1 g/dL (ref 3.5–5.2)
Alkaline Phosphatase: 45 U/L (ref 39–117)
Bilirubin, Direct: 0.1 mg/dL (ref 0.0–0.3)
Total Bilirubin: 0.2 mg/dL (ref 0.2–1.2)
Total Protein: 7.4 g/dL (ref 6.0–8.3)

## 2021-10-28 LAB — LIPASE: Lipase: 13 U/L (ref 11.0–59.0)

## 2021-10-28 NOTE — Assessment & Plan Note (Signed)
BP Readings from Last 3 Encounters:  10/28/21 120/78  09/10/21 (!) 135/104  07/26/21 135/89   Stable, pt to continue medical treatment  - wt control, diet

## 2021-10-28 NOTE — Assessment & Plan Note (Addendum)
Etiology unclear, cant r/o uti - for urine studies and labs as ordered including cbc, lfts,,  to f/u any worsening symptoms or concerns

## 2021-10-28 NOTE — Progress Notes (Signed)
Patient ID: Jessica Mcpherson, female   DOB: 06-Apr-1985, 36 y.o.   MRN: [id number]        Chief Complaint: follow up HTN, urinary frequency, yellow eyes       HPI:  Jessica Mcpherson is a 36 y.o. female here with c/o urinary frequency x 3 days but Denies urinary symptoms such as dysuria,, urgency, flank pain, hematuria or n/v, fever, chills. Does have bilateral lower abd and side pain and soreness to touch without skin change or rash.  Denies worsening reflux, dysphagia, bowel change or blood.  Also concerned about possible yellow tint to the conjunctiva, asks for liver testing.  Ibuprofen helps the pain.  Still smoking, not ready to quit.         Wt Readings from Last 3 Encounters:  10/28/21 160 lb 4 oz (72.7 kg)  06/12/21 154 lb 12.8 oz (70.2 kg)  04/24/21 154 lb (69.9 kg)   BP Readings from Last 3 Encounters:  10/28/21 120/78  09/10/21 (!) 135/104  07/26/21 135/89         Past Medical History:  Diagnosis Date   Anxiety    Arthritis    Right knee   Depression    GERD (gastroesophageal reflux disease)    Headache    Hypertension    Miscarriage    x2   Papillary thyroid carcinoma ([place])    Past Surgical History:  Procedure Laterality Date   CESAREAN SECTION     LAPAROSCOPIC APPENDECTOMY N/A 12/30/2013   Procedure: APPENDECTOMY LAPAROSCOPIC;  Surgeon: Jackolyn Confer, MD;  Location: [facility];  Service: General;  Laterality: N/A;   THYROIDECTOMY N/A 07/22/2019   Procedure: TOTAL THYROIDECTOMY WITH CENTRAL COMPARTMENT LYMPH NODE DISSECTION ZONE VI;  Surgeon: Armandina Gemma, MD;  Location: [facility];  Service: General;  Laterality: N/A;    reports that she has been smoking cigarettes. She has been smoking an average of .1 packs per day. She has never used smokeless tobacco. She reports that she does not drink alcohol and does not use drugs. family history includes Arthritis in her maternal grandfather and maternal grandmother; Asthma in her mother; Diabetes in her maternal grandfather, maternal  grandmother, and mother; Heart disease in her maternal grandfather and maternal grandmother; Hypertension in her father, maternal grandmother, and mother. Allergies  Allergen Reactions   Penicillins Hives and Other (See Comments)    CHILDHOOD ALLERGY Has patient had a PCN reaction causing immediate rash, facial/tongue/throat swelling, SOB or lightheadedness with hypotension: YES Has patient had a PCN reaction causing severe rash involving mucus membranes or skin necrosis: UNKNOWN Has patient had a PCN reaction that required hospitalization: YES Has patient had a PCN reaction occurring within the last 10 years: No If all of the above answers are "NO", then may proceed with Cephalosporin use. Received Ceftriaxone 1g IV 12/30/13   Pepperoni [Pickled Meat] Hives   Current Outpatient Medications on File Prior to Visit  Medication Sig Dispense Refill   etonogestrel-ethinyl estradiol (NUVARING) 0.12-0.015 MG/24HR vaginal ring Insert vaginally and leave in place for 3 consecutive weeks, then remove for 1 week. 3 each 4   levothyroxine (SYNTHROID) 137 MCG tablet TAKE 1 TABLET BY MOUTH ONCE DAILY BEFORE BREAKFAST 45 tablet 1   Vitamin D, Ergocalciferol, (DRISDOL) 1.25 MG (50000 UNIT) CAPS capsule Take 1 capsule (50,000 Units total) by mouth every 7 (seven) days. 12 capsule 3   acetaminophen (TYLENOL) 500 MG tablet Take 1,000 mg by mouth every 6 (six) hours as needed for moderate pain.  Multiple Vitamin (MULTIVITAMIN WITH MINERALS) TABS tablet Take 1 tablet by mouth daily.     traZODone (DESYREL) 50 MG tablet Take 100 mg by mouth at bedtime as needed for sleep.     No current facility-administered medications on file prior to visit.        ROS:  All others reviewed and negative.  Objective        PE:  BP 120/78 (BP Location: Right Arm, Patient Position: Sitting, Cuff Size: Normal)   Pulse 73   Temp 98.3 F (36.8 C) (Oral)   Ht 5' (1.524 m)   Wt 160 lb 4 oz (72.7 kg)   SpO2 98%   BMI 31.30  kg/m                 Constitutional: Pt appears in NAD               HENT: Head: NCAT.                Right Ear: External ear normal.                 Left Ear: External ear normal.                Eyes: . Pupils are equal, round, and reactive to light. Conjunctivae and EOM are normal               Nose: without d/c or deformity               Neck: Neck supple. Gross normal ROM               Cardiovascular: Normal rate and regular rhythm.                 Pulmonary/Chest: Effort normal and breath sounds without rales or wheezing.                Abd:  Soft, but has lower abd tender mostly above the ischiums, ND, + BS, no organomegaly               Neurological: Pt is alert. At baseline orientation, motor grossly intact               Skin: Skin is warm. No rashes, no other new lesions, LE edema - none               Psychiatric: Pt behavior is normal without agitation   Micro: none  Cardiac tracings I have personally interpreted today:  none  Pertinent Radiological findings (summarize): none   Lab Results  Component Value Date   WBC 9.3 06/12/2021   HGB 13.1 06/12/2021   HCT 39.3 06/12/2021   PLT 264.0 06/12/2021   GLUCOSE 108 (H) 06/12/2021   ALT 14 06/12/2021   AST 16 06/12/2021   NA 138 06/12/2021   K 4.2 06/12/2021   CL 108 06/12/2021   CREATININE 0.81 06/12/2021   BUN 15 06/12/2021   CO2 21 06/12/2021   TSH 1.79 07/24/2021   HGBA1C 5.7 12/07/2019   Assessment/Plan:  Jessica Mcpherson is a 36 y.o. Black or African American [2] female with  has a past medical history of Anxiety, Arthritis, Depression, GERD (gastroesophageal reflux disease), Headache, Hypertension, Miscarriage, and Papillary thyroid carcinoma ([place]).  Urinary frequency Etiology unclear, cant r/o uti - for urine studies and labs as ordered including cbc, lfts,,  to f/u any worsening symptoms or concerns   Essential hypertension BP Readings from Last 3 Encounters:  10/28/21 120/78  09/10/21 (!) 135/104   07/26/21 135/89   Stable, pt to continue medical treatment  - wt control, diet  Tobacco abuse Pt counsled to quit, pt not ready  Abdominal pain ? msk - for labs as ordered including cbc, lfts, bmp  Followup: Return if symptoms worsen or fail to improve.  Cathlean Cower, MD 10/28/2021 7:52 PM [facility]

## 2021-10-28 NOTE — Assessment & Plan Note (Signed)
Pt counsled to quit, pt not ready °

## 2021-10-28 NOTE — Assessment & Plan Note (Signed)
?   msk - for labs as ordered including cbc, lfts, bmp

## 2021-10-28 NOTE — Patient Instructions (Signed)
You are given the work note today  Please continue all other medications as before, and refills have been done if requested.  Please have the pharmacy call with any other refills you may need.  Please continue your efforts at being more active, low cholesterol diet, and weight control.  Please keep your appointments with your specialists as you may have planned  Please go to the LAB at the blood drawing area for the tests to be done  You will be contacted by phone if any changes need to be made immediately.  Otherwise, you will receive a letter about your results with an explanation, but please check with MyChart first.  Please remember to sign up for MyChart if you have not done so, as this will be important to you in the future with finding out test results, communicating by private email, and scheduling acute appointments online when needed.

## 2021-10-29 LAB — URINALYSIS, ROUTINE W REFLEX MICROSCOPIC
Bilirubin Urine: NEGATIVE
Hgb urine dipstick: NEGATIVE
Ketones, ur: NEGATIVE
Leukocytes,Ua: NEGATIVE
Nitrite: NEGATIVE
RBC / HPF: NONE SEEN (ref 0–?)
Specific Gravity, Urine: >=1.03 — AB (ref 1.000–1.030)
Total Protein, Urine: NEGATIVE
Urine Glucose: NEGATIVE
Urobilinogen, UA: 0.2 (ref 0.0–1.0)
pH: 6 (ref 5.0–8.0)

## 2021-10-29 LAB — URINE CULTURE

## 2021-12-17 ENCOUNTER — Encounter: Payer: Self-pay | Admitting: Internal Medicine

## 2021-12-20 ENCOUNTER — Emergency Department: Payer: 59

## 2021-12-20 ENCOUNTER — Other Ambulatory Visit: Payer: Self-pay

## 2021-12-20 ENCOUNTER — Emergency Department
Admission: EM | Admit: 2021-12-20 | Discharge: 2021-12-20 | Disposition: A | Discharge status: Home / Self Care | Payer: 59 | Attending: Emergency Medicine | Admitting: Emergency Medicine

## 2021-12-20 ENCOUNTER — Encounter: Payer: Self-pay | Admitting: Emergency Medicine

## 2021-12-20 DIAGNOSIS — R0602 Shortness of breath: Secondary | ICD-10-CM | POA: Diagnosis present

## 2021-12-20 DIAGNOSIS — R0600 Dyspnea, unspecified: Secondary | ICD-10-CM | POA: Insufficient documentation

## 2021-12-20 LAB — CBC
HCT: 41 % (ref 36.0–46.0)
Hemoglobin: 13.4 g/dL (ref 12.0–15.0)
MCH: 29.3 pg (ref 26.0–34.0)
MCHC: 32.7 g/dL (ref 30.0–36.0)
MCV: 89.5 fL (ref 80.0–100.0)
Platelets: 329 10*3/uL (ref 150–400)
RBC: 4.58 MIL/uL (ref 3.87–5.11)
RDW: 13.1 % (ref 11.5–15.5)
WBC: 9.5 10*3/uL (ref 4.0–10.5)
nRBC: 0 % (ref 0.0–0.2)

## 2021-12-20 LAB — BASIC METABOLIC PANEL
Anion gap: 6 (ref 5–15)
BUN: 12 mg/dL (ref 6–20)
CO2: 20 mmol/L — ABNORMAL LOW (ref 22–32)
Calcium: 9.1 mg/dL (ref 8.9–10.3)
Chloride: 112 mmol/L — ABNORMAL HIGH (ref 98–111)
Creatinine, Ser: 0.73 mg/dL (ref 0.44–1.00)
GFR, Estimated: >60 mL/min (ref >60)
Glucose, Bld: 113 mg/dL — ABNORMAL HIGH (ref 70–99)
Potassium: 4.4 mmol/L (ref 3.5–5.1)
Sodium: 138 mmol/L (ref 135–145)

## 2021-12-20 LAB — D-DIMER, QUANTITATIVE: D-Dimer, Quant: 0.28 ug/mL-FEU (ref 0.00–0.50)

## 2021-12-20 LAB — TROPONIN I (HIGH SENSITIVITY): Troponin I (High Sensitivity): 2 ng/L (ref <18)

## 2021-12-20 NOTE — ED Provider Notes (Signed)
[facility] Provider Note    Event Date/Time   First MD Initiated Contact with Patient 12/20/21 [phone]     (approximate)  History   Chief Complaint: Shortness of Breath  HPI  Jessica Mcpherson is a 36 y.o. female with a past medical history anxiety, hypertension, thyroid cancer postresection presents to the emergency department for shortness of breath.  According to the patient for the past 2 to 3 days she has felt somewhat short of breath feeling like she cannot get a full breath of air and at times.  States very mild discomfort in her upper back with deep inspiration.  No history of PE or DVT previously.  Does state some right knee pain but states that is chronic recently had a corticosteroid injection.  Denies any cough congestion or fever.  Physical Exam   Triage Vital Signs: ED Triage Vitals  Enc Vitals Group     BP 12/20/21 0814 124/82     Pulse Rate 12/20/21 0814 68     Resp 12/20/21 0814 18     Temp 12/20/21 0814 98.6 F (37 C)     Temp Source 12/20/21 0814 Oral     SpO2 12/20/21 0814 94 %     Weight 12/20/21 0814 160 lb (72.6 kg)     Height 12/20/21 0814 5' (1.524 m)     Head Circumference --      Peak Flow --      Pain Score 12/20/21 0816 8     Pain Loc --      Pain Edu? --      Excl. in [place]? --     Most recent vital signs: Vitals:   12/20/21 0814  BP: 124/82  Pulse: 68  Resp: 18  Temp: 98.6 F (37 C)  SpO2: 94%    General: Awake, no distress.  CV:  Good peripheral perfusion.  Regular rate and rhythm  Resp:  Normal effort.  Equal breath sounds bilaterally.  Abd:  No distention.  Soft, nontender.  No rebound or guarding. Other:  No chest wall tenderness to palpation.  No lower extremity/calf tenderness to palpation.   ED Results / Procedures / Treatments   EKG  EKG viewed and interpreted by myself shows a normal sinus rhythm at 74 bpm with a narrow QRS, normal axis, normal intervals, no concerning ST changes.  RADIOLOGY  I  have reviewed and interpreted the chest x-ray images.  No sign of consolidation on my evaluation. Radiology has read the chest x-ray as negative.   MEDICATIONS ORDERED IN ED: Medications - No data to display   IMPRESSION / MDM / [place] / ED COURSE  I reviewed the triage vital signs and the nursing notes.  Patient's presentation is most consistent with acute presentation with potential threat to life or bodily function.  Patient presents to the emergency department for shortness of breath over the past 2 to 3 days.  No cough congestion fever.  Reassuring vitals.  No history of PE or DVT previously.  Chest x-ray is reassuring, lab work is reassuring.  As a precaution given the patient's mild pleuritic pain I have added on a D-dimer as well as a troponin.  If the patient's D-dimer is negative anticipate the patient could be discharged home with PCP follow-up.  Patient is D-dimer and troponin are negative.  Given the patient's reassuring workup I believe the patient is safe for discharge home with PCP follow-up.  Patient agreeable to plan of  care.  FINAL CLINICAL IMPRESSION(S) / ED DIAGNOSES   Dyspnea  Note:  This document was prepared using Dragon voice recognition software and may include unintentional dictation errors.   Harvest Dark, MD 12/20/21 1044

## 2021-12-20 NOTE — ED Triage Notes (Signed)
Pt reports that she has been having sob x 2 days, pt states that she feels sob with talking or any movement. Pt denies cough, denies fever. States pain to back with breathing.

## 2021-12-24 ENCOUNTER — Telehealth: Payer: Self-pay

## 2021-12-24 NOTE — Telephone Encounter (Signed)
Transition Care Management Unsuccessful Follow-up Telephone Call  Date of discharge and from where:  12/20/2021  Attempts:  1st Attempt  Reason for unsuccessful TCM follow-up call:  Unable to leave message

## 2021-12-25 NOTE — Progress Notes (Signed)
Patient ID: Jessica Mcpherson, female   DOB: 1985-05-13, 36 y.o.   MRN: [id number]    HPI  Jessica Mcpherson is a 36 y.o.-year-old female, referred by her PCP, Dr. Sharlet Salina, for management of thyroid cancer and postsurgical hypothyroidism.  Last visit 1 year ago.  Interim hx: She feels well, without complaints, at today's visit. She no showed twice for a lymph node biopsy.  We discussed about this before today's visit and she is scheduled for another appointment on 01/09/2022.  She is off from work that day and plans to be there.  Reviewed history: Pt. has been dx with thyroid cancer in 03/2019.   Reviewed her thyroid cancer history: 03/2019: Presented to UC for shoulder pain >> neck fullness on palpation  03/24/2019: Thyroid ultrasound: 3.4 x 1.5 x 2.1 cm solid left mid hypoechoic thyroid nodule with microcalcifications.  04/14/2019: FNA of the nodule: Suspicious for PTC  07/22/2019: Total thyroidectomy with central (zone 6) lymph node dissection:  A. THYROID GLAND, TOTAL, THYROIDECTOMY:  - Papillary thyroid carcinoma, multifocal, 2.4 cm in greatest dimension,  with angioinvasion, extrathyroidal extension and margin involvement.  - See oncology table.  B. LYMPH NODES, CENTRAL COMPARTMENT/ZONE VI, REGIONAL RESECTION:  - Metastatic carcinoma in (2) of (2) lymph nodes with extranodal  extension.  - Thymic tissue with focal involvement of peri-thymic connective tissue  by carcinoma.   ONCOLOGY TABLE:  THYROID GLAND:  Procedure: Total thyroidectomy  Tumor Focality: Multifocal  Tumor Site: Left lobe, right lobe  Tumor Size: Greatest dimension: 2.4 cm  Histologic Type: Papillary carcinoma  Margins: Positive for carcinoma in area of extrathyroidal extension (left lobe)  Angioinvasion: Present  Lymphatic Invasion: Present  Extrathyroidal Extension: Present  Regional Lymph Nodes:       Number of Lymph Nodes Involved: 2       Nodal Levels Involved: Central compartment/Zone VI       Size of  Largest Metastatic Deposit: 1 cm       Extranodal Extension (ENE): Present       Number of Lymph Nodes Examined: 2       Nodal Levels Examined: Central compartment/Zone VI  Pathologic Stage Classification (pTNM, AJCC 8th Edition): mpT3, pN1a  Representative Tumor Block: A2  Comment: Case discussed with Dr. Armandina Gemma on 07/28/2019.  Dr. Saralyn Pilar reviewed select slides.   07/27/2019: CT neck obtained for swelling and pain: edema at thyroid site, no abscess.  10/19/2019: RAI treatment - 124.1 mCi I-131 sodium iodide  10/28/2019: Posttreatment whole-body scan:  1. Expected tracer uptake identified within the thyroid bed compatible with residual functioning thyroid tissue. No signs of I-131 avid nodal metastasis or distant metastatic disease.  Neck U/S (03/01/2021): Soft tissue mass in the left neck near the surgical bed -potential residual/recurrent tissue  CT neck (03/22/2021): Mildly increased lymph node in the left jugular chain, suspicious for lymph node metastasis, but unchanged since 2021  I recommended a core biopsy of the enlarged lymph node but she no showed 2x for the appointment.  Reviewed previous thyroglobulin and ATA antibody levels: Lab Results  Component Value Date   THYROGLB 4.6 12/26/2020   THYROGLB 9.2 07/04/2020   THYROGLB 10.0 12/28/2019   Lab Results  Component Value Date   THGAB <1 12/26/2020   THGAB <1 07/04/2020   THGAB <1 12/28/2019   Pt denies: - feeling nodules in neck - hoarseness - dysphagia - choking  Calcium was normal after surgery: Lab Results  Component Value Date   CALCIUM 9.1 12/20/2021  CALCIUM 9.1 10/28/2021   CALCIUM 9.4 06/12/2021   CALCIUM 9.6 11/07/2020   CALCIUM 9.5 12/07/2019   CALCIUM 9.1 07/27/2019   CALCIUM 9.2 07/23/2019   CALCIUM 9.2 06/17/2019   CALCIUM 9.0 10/12/2018   CALCIUM 9.2 07/19/2018  She was on calcium supplements postop, now off.  Postsurgical hypothyroidism  Pt is on levothyroxine 137 mcg daily  (increased at last visit): - in am - fasting - at least 30 min from b'fast - no calcium - no iron - + MVI later in the day (at night) - no PPIs - not on Biotin  Reviewed her TFTs: Lab Results  Component Value Date   TSH 1.79 07/24/2021   TSH 3.93 06/12/2021   TSH 1.19 12/26/2020   TSH 0.88 10/17/2020   TSH 2.58 07/04/2020   TSH 0.83 05/03/2020   TSH 0.23 (L) 02/28/2020   TSH 0.05 (L) 12/28/2019   TSH 1.94 11/03/2019   TSH 1.24 09/27/2019   FREET4 1.10 07/24/2021   FREET4 0.87 06/12/2021   FREET4 0.93 12/26/2020   FREET4 0.77 10/17/2020   FREET4 0.98 07/04/2020   FREET4 1.09 05/03/2020   FREET4 1.10 02/28/2020   FREET4 1.12 12/28/2019   FREET4 0.70 11/03/2019   FREET4 0.93 09/27/2019  04/12/2019: TSH 3.194 01/04/2019: TSH 1.11  She has + FH of thyroid disorders in: MGM - thyroid nodule.  No family history of thyroid cancer.  No history of radiation to head or neck other than RAI treatment. No herbal supplements. No Biotin use. She had a knee steroid inj >2 weeks ago.  She also has a history of HTN, depression/anxiety. She contacted me that she was pregnant in 10/2020.  However, she had a miscarriage in 11/2020.    ROS: + see HPI   I reviewed pt's medications, allergies, PMH, social hx, family hx, and changes were documented in the history of present illness. Otherwise, unchanged from my initial visit note.  Past Medical History:  Diagnosis Date   Anxiety    Arthritis    Right knee   Depression    GERD (gastroesophageal reflux disease)    Headache    Hypertension    Miscarriage    x2   Papillary thyroid carcinoma ([place])    Past Surgical History:  Procedure Laterality Date   CESAREAN SECTION     LAPAROSCOPIC APPENDECTOMY N/A 12/30/2013   Procedure: APPENDECTOMY LAPAROSCOPIC;  Surgeon: Jackolyn Confer, MD;  Location: [facility];  Service: General;  Laterality: N/A;   THYROIDECTOMY N/A 07/22/2019   Procedure: TOTAL THYROIDECTOMY WITH CENTRAL COMPARTMENT LYMPH NODE  DISSECTION ZONE VI;  Surgeon: Armandina Gemma, MD;  Location: [facility];  Service: General;  Laterality: N/A;   Social History   Socioeconomic History   Marital status: Single    Spouse name: Not on file   Number of children: 1   Years of education: Not on file   Highest education level: Not on file  Occupational History   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  Tobacco Use   Smoking status: Every Day    Packs/day: 0.10    Types: Cigarettes   Smokeless tobacco: Never  Vaping Use   Vaping Use: Never used  Substance and Sexual Activity   Alcohol use: No    Alcohol/week: 0.0 standard drinks of alcohol   Drug use: No   Sexual activity: Yes    Partners: Male    Birth control/protection: None  Other Topics Concern   Not on file  Social History Narrative   Not on file  Social Determinants of Health   Financial Resource Strain: Not on file  Food Insecurity: Not on file  Transportation Needs: Not on file  Physical Activity: Not on file  Stress: Not on file  Social Connections: Not on file  Intimate Partner Violence: Not on file   Current Outpatient Medications on File Prior to Visit  Medication Sig Dispense Refill   acetaminophen (TYLENOL) 500 MG tablet Take 1,000 mg by mouth every 6 (six) hours as needed for moderate pain.     etonogestrel-ethinyl estradiol (NUVARING) 0.12-0.015 MG/24HR vaginal ring Insert vaginally and leave in place for 3 consecutive weeks, then remove for 1 week. 3 each 4   levothyroxine (SYNTHROID) 137 MCG tablet TAKE 1 TABLET BY MOUTH ONCE DAILY BEFORE BREAKFAST 45 tablet 1   Multiple Vitamin (MULTIVITAMIN WITH MINERALS) TABS tablet Take 1 tablet by mouth daily.     traZODone (DESYREL) 50 MG tablet Take 100 mg by mouth at bedtime as needed for sleep.     Vitamin D, Ergocalciferol, (DRISDOL) 1.25 MG (50000 UNIT) CAPS capsule Take 1 capsule (50,000 Units total) by mouth every 7 (seven) days. 12 capsule 3   No current facility-administered medications on file prior to  visit.   Allergies  Allergen Reactions   Penicillins Hives and Other (See Comments)    CHILDHOOD ALLERGY Has patient had a PCN reaction causing immediate rash, facial/tongue/throat swelling, SOB or lightheadedness with hypotension: YES Has patient had a PCN reaction causing severe rash involving mucus membranes or skin necrosis: UNKNOWN Has patient had a PCN reaction that required hospitalization: YES Has patient had a PCN reaction occurring within the last 10 years: No If all of the above answers are "NO", then may proceed with Cephalosporin use. Received Ceftriaxone 1g IV 12/30/13   Pepperoni [Pickled Meat] Hives   Family History  Problem Relation Age of Onset   Diabetes Mother    Hypertension Mother    Asthma Mother    Diabetes Maternal Grandmother    Hypertension Maternal Grandmother    Arthritis Maternal Grandmother    Heart disease Maternal Grandmother    Diabetes Maternal Grandfather    Arthritis Maternal Grandfather    Heart disease Maternal Grandfather    Hypertension Father   + See HPI  PE: BP 120/82 (BP Location: Left Arm, Patient Position: Sitting, Cuff Size: Normal)   Pulse 81   Ht 5' (1.524 m)   Wt 165 lb 3.2 oz (74.9 kg)   LMP 12/13/2021 (Approximate)   SpO2 99%   BMI 32.26 kg/m  Wt Readings from Last 3 Encounters:  12/26/21 165 lb 3.2 oz (74.9 kg)  12/20/21 160 lb (72.6 kg)  10/28/21 160 lb 4 oz (72.7 kg)   Constitutional: overweight, in NAD Eyes: PERRLA, EOMI, no exophthalmos ENT: moist mucous membranes, no neck masses palpated, thyroidectomy scar healed with minimal keloid, no cervical lymphadenopathy Cardiovascular: RRR, No MRG Respiratory: CTA B Musculoskeletal: no deformities, strength intact in all 4 Skin: moist, warm, no rashes Neurological: no tremor with outstretched hands, DTR normal in all 4  ASSESSMENT: 1. Thyroid cancer - see HPI  2. Postsurgical Hypothyroidism  PLAN:  1.  Papillary thyroid cancer -Patient with clinically stage I  TNM due to age (since no distant metastases confirmed so far), but pathologically she is pT3N1a -We discussed that overall, papillary thyroid cancer has a good prognosis -Her tumor was larger than 1.5 cm, multifocal, and had lymphovascular invasion and extension outside the thyroid, so RAI treatment was indicated.  She had this  on 10/19/2019.  Posttreatment whole-body scan showed no metastases or abnormal uptake sites -At last visit, we checked a neck ultrasound and this showed a soft tissue mass in the left neck, near the surgical bed.  A CT neck showed enlarged lymph node in the left jugular chain, suspicious for metastases.  However, this was stable from 2021. -At that time, I recommended a core biopsy.  However, she did not show up for the procedure x2. -we discussed at this visit that it is very important to find out if she has metastasis in the left jugular chain.  -She agreed to have this done -I gave her the telephone number to call and reschedule >> she is scheduled again for 01/09/2022  -Also, at today's visit, we will repeat her thyroglobulin and ATA antibodies -we discussed that if her thyroglobulin is undetectable, we may just need to follow this without intervention.  However, if thyroglobulin is elevated, she may need to have a whole-body scan to see if this region is iodine avid.  If so, she may need RAI treatment.  If not, she may need surgery. -I we will see her back in a year  2.  Patient with history of thyroidectomy for thyroid cancer, now with iatrogenic hypothyroidism, on levothyroxine therapy - latest thyroid labs reviewed with pt. >> normal: Lab Results  Component Value Date   TSH 1.79 07/24/2021  - she continues on LT4 137 mcg daily, increased at last visit - pt feels good on this dose. - we discussed about taking the thyroid hormone every day, with water, >30 minutes before breakfast, separated by >4 hours from acid reflux medications, calcium, iron, multivitamins. Pt. is  taking it correctly. - will check thyroid tests today: TSH and fT4 -our target is at the lower limit of the normal range or slightly lower - If labs are abnormal, she will need to return for repeat TFTs in 1.5 months  Component     Latest Ref Rng 12/26/2021  TSH     0.35 - 5.50 uIU/mL 10.81 (H)   T4,Free(Direct)     0.60 - 1.60 ng/dL 0.81   Thyroglobulin Ab     < or = 1 IU/mL 1   Thyroglobulin     ng/mL 5.9     Thyroglobulin level is slightly higher than before, likely due to the increase in TSH.   We are planning to recheck her TSH in 1.5 months after trying to take the levothyroxine every day, consistently. No other changes are necessary for now.  Lymph node biopsy is pending.  Philemon Kingdom, MD PhD [facility] Endocrinology

## 2021-12-26 ENCOUNTER — Ambulatory Visit: Payer: 59 | Admitting: Internal Medicine

## 2021-12-26 ENCOUNTER — Encounter: Payer: Self-pay | Admitting: Internal Medicine

## 2021-12-26 ENCOUNTER — Encounter: Payer: 59 | Admitting: Internal Medicine

## 2021-12-26 VITALS — BP 120/82 | HR 81 | Ht 60.0 in | Wt 165.2 lb

## 2021-12-26 DIAGNOSIS — C73 Malignant neoplasm of thyroid gland: Secondary | ICD-10-CM | POA: Diagnosis not present

## 2021-12-26 DIAGNOSIS — E890 Postprocedural hypothyroidism: Secondary | ICD-10-CM

## 2021-12-26 LAB — T4, FREE: Free T4: 0.81 ng/dL (ref 0.60–1.60)

## 2021-12-26 LAB — TSH: TSH: 10.81 u[IU]/mL — ABNORMAL HIGH (ref 0.35–5.50)

## 2021-12-26 NOTE — Patient Instructions (Addendum)
Please stop at the lab.  Please continue levothyroxine 137 mcg daily.  Take the thyroid hormone every day, with water, at least 30 minutes before breakfast, separated by at least 4 hours from: - acid reflux medications - calcium - iron - multivitamins  Please come back for a follow-up appointment in 1 year.

## 2021-12-30 LAB — THYROGLOBULIN LEVEL: Thyroglobulin: 5.9 ng/mL

## 2021-12-30 LAB — THYROGLOBULIN ANTIBODY: Thyroglobulin Ab: 1 IU/mL (ref <=1)

## 2022-01-01 ENCOUNTER — Encounter: Payer: Self-pay | Admitting: Internal Medicine

## 2022-01-01 ENCOUNTER — Ambulatory Visit (INDEPENDENT_AMBULATORY_CARE_PROVIDER_SITE_OTHER): Payer: 59 | Admitting: Internal Medicine

## 2022-01-01 VITALS — BP 118/82 | HR 86 | Temp 98.4°F | Ht 60.0 in | Wt 165.2 lb

## 2022-01-01 DIAGNOSIS — I10 Essential (primary) hypertension: Secondary | ICD-10-CM

## 2022-01-01 DIAGNOSIS — Z0001 Encounter for general adult medical examination with abnormal findings: Secondary | ICD-10-CM | POA: Diagnosis not present

## 2022-01-01 DIAGNOSIS — F411 Generalized anxiety disorder: Secondary | ICD-10-CM

## 2022-01-01 DIAGNOSIS — F331 Major depressive disorder, recurrent, moderate: Secondary | ICD-10-CM

## 2022-01-01 DIAGNOSIS — R55 Syncope and collapse: Secondary | ICD-10-CM

## 2022-01-01 DIAGNOSIS — E559 Vitamin D deficiency, unspecified: Secondary | ICD-10-CM

## 2022-01-01 DIAGNOSIS — Z720 Tobacco use: Secondary | ICD-10-CM

## 2022-01-01 LAB — LIPID PANEL
Cholesterol: 233 mg/dL — ABNORMAL HIGH (ref 0–200)
HDL: 71.4 mg/dL (ref >39.00)
LDL Cholesterol: 144 mg/dL — ABNORMAL HIGH (ref 0–99)
NonHDL: 161.18
Total CHOL/HDL Ratio: 3
Triglycerides: 88 mg/dL (ref 0.0–149.0)
VLDL: 17.6 mg/dL (ref 0.0–40.0)

## 2022-01-01 LAB — VITAMIN D 25 HYDROXY (VIT D DEFICIENCY, FRACTURES): VITD: 45.11 ng/mL (ref 30.00–100.00)

## 2022-01-01 MED ORDER — BUPROPION HCL ER (XL) 150 MG PO TB24: 150.0000 mg | ORAL_TABLET | Freq: Every day | ORAL | 3 refills | Status: DC

## 2022-01-01 MED ORDER — ALPRAZOLAM 0.25 MG PO TABS: 0.2500 mg | ORAL_TABLET | Freq: Two times a day (BID) | ORAL | 0 refills | Status: DC | PRN
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
## 2022-01-01 NOTE — Patient Instructions (Addendum)
We are sending in wellbutrin to take 1 pill daily. Make sure to do the counseling after the holidays. We have also sent in alprazolam to use as needed for panic attacks.   Try adding some fiber and let us know if this does not help the stomach we can get a scan of the stomach.

## 2022-01-02 DIAGNOSIS — Z0001 Encounter for general adult medical examination with abnormal findings: Secondary | ICD-10-CM | POA: Insufficient documentation

## 2022-01-02 NOTE — Assessment & Plan Note (Signed)
Counseled to quit 
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
## 2022-01-02 NOTE — Assessment & Plan Note (Signed)
With flare due to PTSD and starting wellbutrin 150 mg daily.

## 2022-01-02 NOTE — Assessment & Plan Note (Signed)
Flu shot declines. Tetanus due declines. Pap smear up to date. Counseled about sun safety and mole surveillance. Counseled about the dangers of distracted driving. Given 10 year screening recommendations.

## 2022-01-02 NOTE — Assessment & Plan Note (Signed)
Recurrence recently due to PTSD and will monitor. No additional testing done today will treat PTSD.

## 2022-01-02 NOTE — Assessment & Plan Note (Signed)
BP at goal off meds currently. Will monitor closely.

## 2022-01-03 ENCOUNTER — Telehealth: Payer: 59

## 2022-01-03 NOTE — Telephone Encounter (Signed)
Called to advise prior auth needed for biopsy ordered. Requested a call back with approval information.

## 2022-01-08 ENCOUNTER — Encounter: Payer: Self-pay | Admitting: Internal Medicine

## 2022-01-08 ENCOUNTER — Other Ambulatory Visit: Payer: Self-pay | Admitting: Radiology

## 2022-01-09 ENCOUNTER — Ambulatory Visit (HOSPITAL_COMMUNITY): Admission: RE | Admit: 2022-01-09 | Payer: 59 | Source: Ambulatory Visit

## 2022-01-15 ENCOUNTER — Other Ambulatory Visit: Payer: Self-pay | Admitting: Internal Medicine

## 2022-01-15 DIAGNOSIS — E890 Postprocedural hypothyroidism: Secondary | ICD-10-CM

## 2022-01-16 ENCOUNTER — Other Ambulatory Visit: Payer: Self-pay | Admitting: Internal Medicine

## 2022-01-16 DIAGNOSIS — E890 Postprocedural hypothyroidism: Secondary | ICD-10-CM

## 2022-01-16 MED ORDER — LEVOTHYROXINE SODIUM 137 MCG PO TABS: 137.0000 ug | ORAL_TABLET | Freq: Every day | ORAL | 1 refills | Status: DC

## 2022-01-30 ENCOUNTER — Encounter: Payer: Self-pay | Admitting: Emergency Medicine

## 2022-01-30 ENCOUNTER — Other Ambulatory Visit: Payer: Self-pay

## 2022-01-30 ENCOUNTER — Emergency Department
Admission: EM | Admit: 2022-01-30 | Discharge: 2022-01-30 | Disposition: A | Discharge status: Home / Self Care | Payer: 59 | Attending: Emergency Medicine | Admitting: Emergency Medicine

## 2022-01-30 DIAGNOSIS — J029 Acute pharyngitis, unspecified: Secondary | ICD-10-CM | POA: Diagnosis present

## 2022-01-30 DIAGNOSIS — Z20822 Contact with and (suspected) exposure to covid-19: Secondary | ICD-10-CM | POA: Insufficient documentation

## 2022-01-30 DIAGNOSIS — J111 Influenza due to unidentified influenza virus with other respiratory manifestations: Secondary | ICD-10-CM

## 2022-01-30 DIAGNOSIS — J069 Acute upper respiratory infection, unspecified: Secondary | ICD-10-CM | POA: Diagnosis not present

## 2022-01-30 LAB — RESP PANEL BY RT-PCR (RSV, FLU A&B, COVID)  RVPGX2
Influenza A by PCR: NEGATIVE
Influenza B by PCR: NEGATIVE
Resp Syncytial Virus by PCR: NEGATIVE
SARS Coronavirus 2 by RT PCR: NEGATIVE

## 2022-01-30 LAB — GROUP A STREP BY PCR: Group A Strep by PCR: NOT DETECTED

## 2022-01-30 MED ORDER — ACETAMINOPHEN 325 MG PO TABS
650.0000 mg | ORAL_TABLET | Freq: Once | ORAL | Status: AC
  Administered 2022-01-30: 650 mg via ORAL
  Filled 2022-01-30: qty 2

## 2022-01-30 NOTE — Discharge Instructions (Signed)
Your swabs were negative for flu, COVID, RSV, and strep.  It is likely that you have another viral infection.  You may continue to take Tylenol/ibuprofen per package instructions to help with your symptoms.  Please return for any new, worsening, or change in symptoms or other concerns.  It was a pleasure caring for you today.

## 2022-01-30 NOTE — ED Provider Notes (Signed)
[facility] Provider Note    Event Date/Time   First MD Initiated Contact with Patient 01/30/22 0805     (approximate)   History   Fever   HPI  Jessica Mcpherson is a 37 y.o. female who presents today for evaluation of sore throat, body aches, headache, nasal congestion, cough, fever since yesterday.  Patient reports that she last took ibuprofen 7 hours prior to arrival.  She reports that she lives with her niece who was recently diagnosed with the flu.  Patient reports that she did not get a flu shot this year.  She denies chest pain, shortness of breath, abdominal pain, nausea, vomiting.  She reports that she has had a couple of episodes of nonbloody diarrhea.  Patient Active Problem List   Diagnosis Date Noted   Encounter for general adult medical examination with abnormal findings 01/02/2022   Abdominal pain 10/28/2021   Urinary frequency 10/28/2021   Syncope 06/13/2021   Insomnia 11/07/2020   Gonorrhea 12/26/2019   Papanicolaou smear of cervix with positive high risk human papilloma virus (HPV) test 12/26/2019   Papillary thyroid carcinoma ([place]) 05/03/2019   Essential hypertension 05/03/2019   Tobacco abuse 05/03/2019   Depression 05/03/2019   Right rotator cuff tendonitis 01/19/2019   Synovitis of right shoulder 01/19/2019   GAD (generalized anxiety disorder) 08/27/2017          Physical Exam   Triage Vital Signs: ED Triage Vitals  Enc Vitals Group     BP 01/30/22 0802 137/87     Pulse Rate 01/30/22 0802 81     Resp 01/30/22 0802 18     Temp 01/30/22 0802 98.7 F (37.1 C)     Temp src --      SpO2 01/30/22 0802 100 %     Weight --      Height --      Head Circumference --      Peak Flow --      Pain Score 01/30/22 0801 8     Pain Loc --      Pain Edu? --      Excl. in [place]? --     Most recent vital signs: Vitals:   01/30/22 0802  BP: 137/87  Pulse: 81  Resp: 18  Temp: 98.7 F (37.1 C)  SpO2: 100%    Physical  Exam Vitals and nursing note reviewed.  Constitutional:      General: Awake and alert. No acute distress.    Appearance: Normal appearance. The patient is normal weight.  HENT:     Head: Normocephalic and atraumatic.     Mouth: Mucous membranes are moist. Uvula midline.  No tonsillar exudate.  No soft palate fluctuance.  No trismus.  No voice change.  No sublingual swelling.  No tender cervical lymphadenopathy.  No nuchal rigidity  Eyes:     General: PERRL. Normal EOMs        Right eye: No discharge.        Left eye: No discharge.     Conjunctiva/sclera: Conjunctivae normal.  Cardiovascular:     Rate and Rhythm: Normal rate and regular rhythm.     Pulses: Normal pulses.  Pulmonary:     Effort: Pulmonary effort is normal. No respiratory distress.     Breath sounds: Normal breath sounds.  Able to speak easily in complete sentences Abdominal:     Abdomen is soft. There is no abdominal tenderness. No rebound or guarding. No distention. Musculoskeletal:  General: No swelling. Normal range of motion.     Cervical back: Normal range of motion and neck supple.  Skin:    General: Skin is warm and dry.     Capillary Refill: Capillary refill takes less than 2 seconds.     Findings: No rash.  Neurological:     Mental Status: The patient is awake and alert.      ED Results / Procedures / Treatments   Labs (all labs ordered are listed, but only abnormal results are displayed) Labs Reviewed  RESP PANEL BY RT-PCR (RSV, FLU A&B, COVID)  [id number]  GROUP A STREP BY PCR     EKG     RADIOLOGY     PROCEDURES:  Critical Care performed:   Procedures   MEDICATIONS ORDERED IN ED: Medications  acetaminophen (TYLENOL) tablet 650 mg (650 mg Oral Given 01/30/22 0824)     IMPRESSION / MDM / [place] / ED COURSE  I reviewed the triage vital signs and the nursing notes.   Differential diagnosis includes, but is not limited to COVID, influenza, RSV, strep  pharyngitis, viral pharyngitis, bronchitis, pneumonia, other URI.  Patient is hypertensive on arrival, though no headache, chest pain, visual changes, and she did not take her blood pressure medication this morning.  She is afebrile.  She is able to speak easily in complete sentences and demonstrates no increased work of breathing and has a normal oxygen saturation on room air.  COVID/flu/RSV and strep swabs obtained.  Patient was treated symptomatically with Tylenol.  Swabs are negative.  Patient is reassured by these findings.  She declined work note.  We discussed symptomatic management and return precautions.  Patient understands and agrees with plan.  Discharged in stable condition.    Patient's presentation is most consistent with acute complicated illness / injury requiring diagnostic workup.      FINAL CLINICAL IMPRESSION(S) / ED DIAGNOSES   Final diagnoses:  Influenza-like illness  Upper respiratory tract infection, unspecified type     Rx / DC Orders   ED Discharge Orders     None        Note:  This document was prepared using Dragon voice recognition software and may include unintentional dictation errors.   Emeline Gins 01/30/22 1215    Rada Hay, MD 01/30/22 [phone]

## 2022-01-30 NOTE — ED Triage Notes (Signed)
Pt comes with c/o sore throat, fever body aches headache. Pt stats this all started yesterday. Pt states nasal congestion as well.

## 2022-02-12 ENCOUNTER — Encounter: Payer: Self-pay | Admitting: Internal Medicine

## 2022-02-12 ENCOUNTER — Other Ambulatory Visit (INDEPENDENT_AMBULATORY_CARE_PROVIDER_SITE_OTHER): Payer: 59

## 2022-02-12 ENCOUNTER — Ambulatory Visit: Payer: 59 | Admitting: Internal Medicine

## 2022-02-12 VITALS — BP 120/100 | HR 75 | Temp 98.1°F | Ht 60.0 in | Wt 169.0 lb

## 2022-02-12 DIAGNOSIS — I10 Essential (primary) hypertension: Secondary | ICD-10-CM | POA: Diagnosis not present

## 2022-02-12 DIAGNOSIS — E890 Postprocedural hypothyroidism: Secondary | ICD-10-CM | POA: Diagnosis not present

## 2022-02-12 DIAGNOSIS — Z7184 Encounter for health counseling related to travel: Secondary | ICD-10-CM | POA: Insufficient documentation

## 2022-02-12 LAB — T4, FREE: Free T4: 0.93 ng/dL (ref 0.60–1.60)

## 2022-02-12 LAB — TSH: TSH: 1.6 u[IU]/mL (ref 0.35–5.50)

## 2022-02-12 MED ORDER — SCOPOLAMINE 1 MG/3DAYS TD PT72: 1.0000 | MEDICATED_PATCH | TRANSDERMAL | 0 refills | Status: AC

## 2022-02-12 NOTE — Patient Instructions (Signed)
The patch last 3 days and you place behind the ear. Switch ears with each patch. The main side effect is dry mouth which generally is mild. It can be bad enough that people remove the patch. Make sure to wash hands well after applying or removing patch as the medicine can be harmful if it accidentally gets into the eye.

## 2022-02-12 NOTE — Assessment & Plan Note (Signed)
BP elevated today which is unusual for her. She denies dietary changes. We will monitor closely as she has not had elevated readings in several years. Not on medication.

## 2022-02-12 NOTE — Assessment & Plan Note (Signed)
Going on 7 day cruise upcoming. Counseled about various options and she elects transderm. Rx done 3 patches and counseled about need for strict hand washing after touching medication. Place morning of cruise change every 3 days.

## 2022-02-12 NOTE — Progress Notes (Signed)
   Subjective:   Patient ID: Jessica Mcpherson, female    DOB: 1985/02/17, 37 y.o.   MRN: [id number]  HPI The patient is a 37 YO female going on cruise wants to discuss options for seasickness.  Review of Systems  Constitutional: Negative.   HENT: Negative.    Eyes: Negative.   Respiratory:  Negative for cough, chest tightness and shortness of breath.   Cardiovascular:  Negative for chest pain, palpitations and leg swelling.  Gastrointestinal:  Negative for abdominal distention, abdominal pain, constipation, diarrhea, nausea and vomiting.  Musculoskeletal: Negative.   Skin: Negative.   Neurological: Negative.   Psychiatric/Behavioral: Negative.      Objective:  Physical Exam Constitutional:      Appearance: She is well-developed.  HENT:     Head: Normocephalic and atraumatic.  Cardiovascular:     Rate and Rhythm: Normal rate and regular rhythm.  Pulmonary:     Effort: Pulmonary effort is normal. No respiratory distress.     Breath sounds: Normal breath sounds. No wheezing or rales.  Abdominal:     General: Bowel sounds are normal. There is no distension.     Palpations: Abdomen is soft.     Tenderness: There is no abdominal tenderness. There is no rebound.  Musculoskeletal:     Cervical back: Normal range of motion.  Skin:    General: Skin is warm and dry.  Neurological:     Mental Status: She is alert and oriented to person, place, and time.     Coordination: Coordination normal.     Vitals:   02/12/22 0933 02/12/22 0938  BP: (!) 120/100 (!) 120/100  Pulse: 75   Temp: 98.1 F (36.7 C)   TempSrc: Oral   SpO2: 99%   Weight: 169 lb (76.7 kg)   Height: 5' (1.524 m)     Assessment & Plan:  Visit time 15 minutes in face to face communication with patient and coordination of care, additional 5 minutes spent in record review, coordination or care, ordering tests, communicating/referring to other healthcare professionals, documenting in medical records all on the same day  of the visit for total time 20 minutes spent on the visit.

## 2022-02-15 ENCOUNTER — Other Ambulatory Visit: Payer: Self-pay | Admitting: Internal Medicine

## 2022-02-15 ENCOUNTER — Encounter: Payer: Self-pay | Admitting: Internal Medicine

## 2022-02-15 DIAGNOSIS — E890 Postprocedural hypothyroidism: Secondary | ICD-10-CM

## 2022-02-17 MED ORDER — LEVOTHYROXINE SODIUM 137 MCG PO TABS: 137.0000 ug | ORAL_TABLET | Freq: Every day | ORAL | 1 refills | Status: DC

## 2022-03-02 ENCOUNTER — Encounter (HOSPITAL_COMMUNITY): Payer: Self-pay

## 2022-03-02 NOTE — H&P (Incomplete)
Chief Complaint: Left neck mass. Request is for thyroid biopsy for further evaluation of thryoid cancer metastasis .   Referring Physician(s): Gherghe,Cristina  Supervising Physician: {Supervising Physician:[id number]}  Patient Status: [facility] - Out-pt  History of Present Illness: Jessica Mcpherson is a 37 y.o. female female outpatient. History of GERD, HTN, metastatic thyroid cancer. S.p RAI treatment. Found to have a soft tissue mass on the left side of her neck. CT soft tissue neck from 3.9.23 reads  Prior sonographic findings likely relate to mildly enlarged lymph nodes in the left jugular chain. Given their asymmetry and sonographic hypervascularity these are suspicious for metastatic nodes. The nodes have not clearly changed since a 2021 CT comparison.Team is requesting a biopsy for further evaluation of thyroid cancer metastasis.    Currently without any significant complaints. Patient alert and laying in bed,calm. Denies any fevers, headache, chest pain, SOB, cough, abdominal pain, nausea, vomiting or bleeding. Return precautions and treatment recommendations and follow-up discussed with the patient *** who is agreeable with the plan.     Past Medical History:  Diagnosis Date   Anxiety    Arthritis    Right knee   Depression    GERD (gastroesophageal reflux disease)    Headache    Hypertension    Miscarriage    x2   Papillary thyroid carcinoma ([place])     Past Surgical History:  Procedure Laterality Date   CESAREAN SECTION     LAPAROSCOPIC APPENDECTOMY N/A 12/30/2013   Procedure: APPENDECTOMY LAPAROSCOPIC;  Surgeon: Jackolyn Confer, MD;  Location: [facility] ORS;  Service: General;  Laterality: N/A;   THYROIDECTOMY N/A 07/22/2019   Procedure: TOTAL THYROIDECTOMY WITH CENTRAL COMPARTMENT LYMPH NODE DISSECTION ZONE VI;  Surgeon: Armandina Gemma, MD;  Location: [facility] ORS;  Service: General;  Laterality: N/A;    Allergies: Penicillins and Pepperoni [pickled meat]  Medications: Prior to  Admission medications   Medication Sig Start Date End Date Taking? Authorizing Provider  acetaminophen (TYLENOL) 500 MG tablet Take 1,000 mg by mouth every 6 (six) hours as needed for moderate pain.    [provider]  ALPRAZolam Duanne Moron) 0.25 MG tablet Take 1 tablet (0.25 mg total) by mouth 2 (two) times daily as needed (panic attacks). 01/01/22   Hoyt Koch, MD  buPROPion (WELLBUTRIN XL) 150 MG 24 hr tablet Take 1 tablet (150 mg total) by mouth daily. 01/01/22   Hoyt Koch, MD  etonogestrel-ethinyl estradiol (NUVARING) 0.12-0.015 MG/24HR vaginal ring Insert vaginally and leave in place for 3 consecutive weeks, then remove for 1 week. 05/31/21   Caren Macadam, MD  levothyroxine (SYNTHROID) 137 MCG tablet Take 1 tablet (137 mcg total) by mouth daily before breakfast. 02/17/22   Philemon Kingdom, MD  Multiple Vitamin (MULTIVITAMIN WITH MINERALS) TABS tablet Take 1 tablet by mouth daily.    [provider]  scopolamine (TRANSDERM SCOP, 1.5 MG,) 1 MG/3DAYS Place 1 patch (1.5 mg total) onto the skin every 3 (three) days. 02/12/22   Hoyt Koch, MD  traZODone (DESYREL) 50 MG tablet Take 100 mg by mouth at bedtime as needed for sleep.    [provider]  Vitamin D, Ergocalciferol, (DRISDOL) 1.25 MG (50000 UNIT) CAPS capsule Take 1 capsule (50,000 Units total) by mouth every 7 (seven) days. 06/13/21   Hoyt Koch, MD     Family History  Problem Relation Age of Onset   Diabetes Mother    Hypertension Mother    Asthma Mother    Diabetes Maternal Grandmother  Hypertension Maternal Grandmother    Arthritis Maternal Grandmother    Heart disease Maternal Grandmother    Diabetes Maternal Grandfather    Arthritis Maternal Grandfather    Heart disease Maternal Grandfather    Hypertension Father     Social History   Socioeconomic History   Marital status: Single    Spouse name: Not on file   Number of children: 1   Years of  education: Not on file   Highest education level: Not on file  Occupational History   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  Tobacco Use   Smoking status: Every Day    Packs/day: 0.10    Types: Cigarettes   Smokeless tobacco: Never  Vaping Use   Vaping Use: Never used  Substance and Sexual Activity   Alcohol use: No    Alcohol/week: 0.0 standard drinks of alcohol   Drug use: No   Sexual activity: Yes    Partners: Male    Birth control/protection: None  Other Topics Concern   Not on file  Social History Narrative   Not on file   Social Determinants of Health   Financial Resource Strain: Not on file  Food Insecurity: Not on file  Transportation Needs: Not on file  Physical Activity: Not on file  Stress: Not on file  Social Connections: Not on file    ECOG Status: {CHL ONC ECOG [id number]  Review of Systems: A 12 point ROS discussed and pertinent positives are indicated in the HPI above.  All other systems are negative.  Review of Systems  Vital Signs: LMP 02/04/2022 (Approximate)     Physical Exam  Imaging: No results found.  Labs:  CBC: Recent Labs    06/12/21 1001 10/28/21 1602 12/20/21 0820  WBC 9.3 9.6 9.5  HGB 13.1 12.9 13.4  HCT 39.3 38.7 41.0  PLT 264.0 262.0 329    COAGS: No results for input(s): "INR", "APTT" in the last 8760 hours.  BMP: Recent Labs    06/12/21 1001 10/28/21 1602 12/20/21 0820  NA 138 136 138  K 4.2 4.2 4.4  CL 108 105 112*  CO2 21 23 20*  GLUCOSE 108* 73 113*  BUN 15 14 12  $ CALCIUM 9.4 9.1 9.1  CREATININE 0.81 0.70 0.73  GFRNONAA  --   --  >60    LIVER FUNCTION TESTS: Recent Labs    06/12/21 1001 10/28/21 1602  BILITOT 0.2 0.2  AST 16 13  ALT 14 13  ALKPHOS 47 45  PROT 7.4 7.4  ALBUMIN 4.1 4.1    Assessment and Plan:  37 y.o. female outpatient. History of GERD, HTN, metastatic thyroid cancer. S.p RAI treatment. Found to have a soft tissue mass on the left side of her neck. CT soft tissue neck from  3.9.23 reads  Prior sonographic findings likely relate to mildly enlarged lymph nodes in the left jugular chain. Given their asymmetry and sonographic hypervascularity these are suspicious for metastatic nodes. The nodes have not clearly changed since a 2021 CT comparison.Team is requesting a biopsy for further evaluation of thyroid cancer metastasis.   *** All other labs and medications are within acceptable parameters. Allergies include PCN. Patient has been NPO since midnight.  Risks and benefits of left neck mss biopsy was discussed with the patient and/or patient's family including, but not limited to bleeding, infection, damage to adjacent structures or low yield requiring additional tests.  All of the questions were answered and there is agreement to proceed.  Consent signed and in chart.  Thank you for this interesting consult.  I greatly enjoyed meeting Jessica Mcpherson and look forward to participating in their care.  A copy of this report was sent to the requesting provider on this date.  Electronically Signed: Jacqualine Mau, NP 03/02/2022, 8:14 PM   I spent a total of {New ZM:[id number] {New Out-Pt:[id number]}  {Established Out-Pt:[id number]} in face to face in clinical consultation, greater than 50% of which was counseling/coordinating care for ***

## 2022-03-05 ENCOUNTER — Ambulatory Visit (INDEPENDENT_AMBULATORY_CARE_PROVIDER_SITE_OTHER): Payer: 59

## 2022-03-05 ENCOUNTER — Encounter: Payer: Self-pay | Admitting: Emergency Medicine

## 2022-03-05 ENCOUNTER — Ambulatory Visit
Admission: EM | Admit: 2022-03-05 | Discharge: 2022-03-05 | Disposition: A | Discharge status: Home / Self Care | Payer: 59 | Attending: Urgent Care | Admitting: Urgent Care

## 2022-03-05 DIAGNOSIS — R6889 Other general symptoms and signs: Secondary | ICD-10-CM | POA: Diagnosis not present

## 2022-03-05 DIAGNOSIS — F17210 Nicotine dependence, cigarettes, uncomplicated: Secondary | ICD-10-CM | POA: Diagnosis not present

## 2022-03-05 DIAGNOSIS — R21 Rash and other nonspecific skin eruption: Secondary | ICD-10-CM | POA: Diagnosis not present

## 2022-03-05 DIAGNOSIS — N912 Amenorrhea, unspecified: Secondary | ICD-10-CM | POA: Diagnosis not present

## 2022-03-05 DIAGNOSIS — Z1152 Encounter for screening for COVID-19: Secondary | ICD-10-CM | POA: Diagnosis not present

## 2022-03-05 DIAGNOSIS — R058 Other specified cough: Secondary | ICD-10-CM | POA: Insufficient documentation

## 2022-03-05 LAB — POCT URINE PREGNANCY: Preg Test, Ur: POSITIVE — AB

## 2022-03-05 MED ORDER — BENZONATATE 100 MG PO CAPS: ORAL_CAPSULE | ORAL | 0 refills | Status: DC

## 2022-03-05 MED ORDER — HYDROCOD POLI-CHLORPHE POLI ER 10-8 MG/5ML PO SUER: 5.0000 mL | Freq: Two times a day (BID) | ORAL | 0 refills | Status: AC | PRN

## 2022-03-05 NOTE — Discharge Instructions (Addendum)
You have been diagnosed with a viral upper respiratory infection based on your symptoms and exam. Viral illnesses cannot be treated with antibiotics - they are self limiting - and you should find your symptoms resolving within a few days. Get plenty of rest and non-caffeinated fluids. Watch for signs of dehydration including reduced urine output and dark colored urine.  We have performed a respiratory swab testing for COVID.  Someone will contact you after results of your swab are available with instructions to continue or stop this medication. If the results of this testing are positive, someone will call you if you are eligible for any antiviral treatment.    We recommend you use over-the-counter medications for symptom control including acetaminophen (Tylenol), ibuprofen (Advil/Motrin) or naproxen (Aleve) for throat pain, fever, chills or body aches. You may combine use of acetaminophen and ibuprofen/naproxen if needed.  Some patients find an pain-relieving throat spray such as Chloraseptic to be effective.  Also recommend cold/cough medication containing a cough suppressant such as dextromethorphan, as needed. Cough medication has been prescribed.   Saline mist spray is helpful for removing excess mucus from your nose.  Room humidifiers are helpful to ease breathing at night. I recommend guaifenesin (Mucinex) with plenty of water throughout the day to help thin and loosen mucus secretions in your respiratory passages.   If appropriate based upon your other medical problems, you might also find relief of nasal/sinus congestion symptoms by using a nasal decongestant such as fluticasone (Flonase ) or pseudoephedrine (Sudafed sinus).  You will need to obtain Sudafed from behind the pharmacist counter.  Speak to the pharmacist to verify that you are not duplicating medications with other over-the-counter formulations that you may be using.

## 2022-03-05 NOTE — ED Provider Notes (Signed)
Jessica Mcpherson    CSN: [id number] Arrival date & time: 03/05/22  0957      History   Chief Complaint Chief Complaint  Patient presents with   Cough    HPI Jessica Mcpherson is a 37 y.o. female.    Cough   Patient presents to urgent care after returning from a cruise 3 days ago with developing upper respiratory symptoms.  She endorses nasal congestion, cough, sore throat, headache, chills starting Monday.  She also reports scattered papular itchy rash.  States that the rash began on her waist/trunk and then to extremities.  No rash on her palms.  She denies fever, body aches.  Denies nausea, vomiting, diarrhea.  Using Mucinex at home for symptoms.  Past Medical History:  Diagnosis Date   Anxiety    Arthritis    Right knee   Depression    GERD (gastroesophageal reflux disease)    Headache    Hypertension    Miscarriage    x2   Papillary thyroid carcinoma [facility])     Patient Active Problem List   Diagnosis Date Noted   Travel advice encounter 02/12/2022   Encounter for general adult medical examination with abnormal findings 01/02/2022   Abdominal pain 10/28/2021   Urinary frequency 10/28/2021   Syncope 06/13/2021   Insomnia 11/07/2020   Gonorrhea 12/26/2019   Papanicolaou smear of cervix with positive high risk human papilloma virus (HPV) test 12/26/2019   Papillary thyroid carcinoma ([place]) 05/03/2019   Essential hypertension 05/03/2019   Tobacco abuse 05/03/2019   Depression 05/03/2019   Right rotator cuff tendonitis 01/19/2019   Synovitis of right shoulder 01/19/2019   GAD (generalized anxiety disorder) 08/27/2017    Past Surgical History:  Procedure Laterality Date   CESAREAN SECTION     LAPAROSCOPIC APPENDECTOMY N/A 12/30/2013   Procedure: APPENDECTOMY LAPAROSCOPIC;  Surgeon: Jackolyn Confer, MD;  Location: [facility];  Service: General;  Laterality: N/A;   THYROIDECTOMY N/A 07/22/2019   Procedure: TOTAL THYROIDECTOMY WITH CENTRAL COMPARTMENT LYMPH  NODE DISSECTION ZONE VI;  Surgeon: Armandina Gemma, MD;  Location: [facility];  Service: General;  Laterality: N/A;    OB History     Gravida  5   Para  1   Term  1   Preterm      AB  2   Living  1      SAB  1   IAB      Ectopic      Multiple      Live Births  1            Home Medications    Prior to Admission medications   Medication Sig Start Date End Date Taking? Authorizing Provider  acetaminophen (TYLENOL) 500 MG tablet Take 1,000 mg by mouth every 6 (six) hours as needed for moderate pain.    [provider]  ALPRAZolam Duanne Moron) 0.25 MG tablet Take 1 tablet (0.25 mg total) by mouth 2 (two) times daily as needed (panic attacks). 01/01/22   Hoyt Koch, MD  buPROPion (WELLBUTRIN XL) 150 MG 24 hr tablet Take 1 tablet (150 mg total) by mouth daily. 01/01/22   Hoyt Koch, MD  etonogestrel-ethinyl estradiol (NUVARING) 0.12-0.015 MG/24HR vaginal ring Insert vaginally and leave in place for 3 consecutive weeks, then remove for 1 week. 05/31/21   Caren Macadam, MD  levothyroxine (SYNTHROID) 137 MCG tablet Take 1 tablet (137 mcg total) by mouth daily before breakfast. 02/17/22   Philemon Kingdom, MD  Multiple Vitamin (MULTIVITAMIN WITH MINERALS) TABS tablet Take 1 tablet by mouth daily.    [provider]  scopolamine (TRANSDERM SCOP, 1.5 MG,) 1 MG/3DAYS Place 1 patch (1.5 mg total) onto the skin every 3 (three) days. 02/12/22   Hoyt Koch, MD  traZODone (DESYREL) 50 MG tablet Take 100 mg by mouth at bedtime as needed for sleep.    [provider]  Vitamin D, Ergocalciferol, (DRISDOL) 1.25 MG (50000 UNIT) CAPS capsule Take 1 capsule (50,000 Units total) by mouth every 7 (seven) days. 06/13/21   Hoyt Koch, MD    Family History Family History  Problem Relation Age of Onset   Diabetes Mother    Hypertension Mother    Asthma Mother    Diabetes Maternal Grandmother    Hypertension Maternal Grandmother     Arthritis Maternal Grandmother    Heart disease Maternal Grandmother    Diabetes Maternal Grandfather    Arthritis Maternal Grandfather    Heart disease Maternal Grandfather    Hypertension Father     Social History Social History   Tobacco Use   Smoking status: Every Day    Packs/day: 0.10    Types: Cigarettes   Smokeless tobacco: Never  Vaping Use   Vaping Use: Never used  Substance Use Topics   Alcohol use: No    Alcohol/week: 0.0 standard drinks of alcohol   Drug use: No     Allergies   Penicillins and Pepperoni [pickled meat]   Review of Systems Review of Systems  Respiratory:  Positive for cough.      Physical Exam Triage Vital Signs ED Triage Vitals [03/05/22 1009]  Enc Vitals Group     BP 119/80     Pulse Rate 95     Resp 16     Temp 98.7 F (37.1 C)     Temp Source Oral     SpO2 98 %     Weight      Height      Head Circumference      Peak Flow      Pain Score 0     Pain Loc      Pain Edu?      Excl. in [place]?    No data found.  Updated Vital Signs BP 119/80 (BP Location: Left Arm)   Pulse 95   Temp 98.7 F (37.1 C) (Oral)   Resp 16   LMP 02/04/2022 (Approximate)   SpO2 98%   Visual Acuity Right Eye Distance:   Left Eye Distance:   Bilateral Distance:    Right Eye Near:   Left Eye Near:    Bilateral Near:     Physical Exam Vitals reviewed.  Constitutional:      Appearance: Normal appearance.  HENT:     Right Ear: Tympanic membrane normal.     Mouth/Throat:     Mouth: Mucous membranes are moist.     Pharynx: No oropharyngeal exudate or posterior oropharyngeal erythema.  Cardiovascular:     Rate and Rhythm: Normal rate and regular rhythm.     Pulses: Normal pulses.     Heart sounds: Normal heart sounds.  Pulmonary:     Effort: Pulmonary effort is normal.     Breath sounds: Normal breath sounds.  Skin:    General: Skin is warm and dry.     Findings: Rash present. Rash is papular.  Neurological:     General: No focal  deficit present.     Mental Status:  She is alert.  Psychiatric:        Mood and Affect: Mood normal.        Behavior: Behavior normal.      UC Treatments / Results  Labs (all labs ordered are listed, but only abnormal results are displayed) Labs Reviewed - No data to display  EKG   Radiology No results found.  Procedures Procedures (including critical care time)  Medications Ordered in UC Medications - No data to display  Initial Impression / Assessment and Plan / UC Course  I have reviewed the triage vital signs and the nursing notes.  Pertinent labs & imaging results that were available during my care of the patient were reviewed by me and considered in my medical decision making (see chart for details).   Patient is afebrile here without recent antipyretics. Satting well on room air. Overall is well appearing, well hydrated, without respiratory distress. Pulmonary exam is unremarkable.  Lungs CTAB without wheezing, rhonchi, rales.  TMs are WNL bilaterally.  Sparse papular rash is present on her torso and upper extremities.  Patient's symptoms are consistent with an acute viral process with exanthem, apparently no fever though she has had chills.  Recommending continued use of OTC medication for symptom control.  She states that her cough has not been adequately controlled with OTC medication and so will prescribe benzonatate as well as Tussionex for nighttime use.  Final Clinical Impressions(s) / UC Diagnoses   Final diagnoses:  None   Discharge Instructions   None    ED Prescriptions   None    PDMP not reviewed this encounter.   Rose Phi, [place] 03/05/22 1042

## 2022-03-05 NOTE — ED Triage Notes (Signed)
Recently returned from a cruise this past weekend. Monday developed nasal congestion, cough, sore throat, headache, chills, and scattered papular itching rash. Rash began on waist/trunk and extended out to extremities with palmar sparing. Denies generalized body aches, N/V/D. Taking mucinex at home. Reports no sick contacts.

## 2022-03-05 NOTE — Progress Notes (Signed)
Jessica Mcpherson here for a UPT. LMP is 1.18.24.     UPT in office Positive.    Reviewed medications and informed to start a PNV, if not already. Pt to follow up in  follow up with RN for OB interview  .     [name], CMA

## 2022-03-06 ENCOUNTER — Other Ambulatory Visit: Payer: Self-pay | Admitting: Radiology

## 2022-03-06 LAB — SARS CORONAVIRUS 2 (TAT 6-24 HRS): SARS Coronavirus 2: NEGATIVE

## 2022-03-06 NOTE — Progress Notes (Signed)
Patient seen and assessed by nursing staff.  Agree with documentation and plan.  

## 2022-03-07 ENCOUNTER — Encounter: Payer: Self-pay | Admitting: Internal Medicine

## 2022-03-10 ENCOUNTER — Ambulatory Visit (HOSPITAL_COMMUNITY)
Admission: RE | Admit: 2022-03-10 | Discharge: 2022-03-10 | Disposition: A | Discharge status: Home / Self Care | Payer: 59 | Source: Ambulatory Visit | Attending: Internal Medicine | Admitting: Internal Medicine

## 2022-03-10 ENCOUNTER — Encounter (HOSPITAL_COMMUNITY): Payer: Self-pay

## 2022-03-10 ENCOUNTER — Other Ambulatory Visit: Payer: Self-pay | Admitting: Internal Medicine

## 2022-03-10 DIAGNOSIS — E890 Postprocedural hypothyroidism: Secondary | ICD-10-CM

## 2022-03-11 ENCOUNTER — Other Ambulatory Visit: Payer: Self-pay | Admitting: *Deleted

## 2022-03-11 ENCOUNTER — Emergency Department
Admission: EM | Admit: 2022-03-11 | Discharge: 2022-03-11 | Disposition: A | Discharge status: Home / Self Care | Payer: 59 | Attending: Emergency Medicine | Admitting: Emergency Medicine

## 2022-03-11 ENCOUNTER — Other Ambulatory Visit: Payer: Self-pay

## 2022-03-11 ENCOUNTER — Emergency Department: Payer: 59

## 2022-03-11 ENCOUNTER — Encounter: Payer: Self-pay | Admitting: Emergency Medicine

## 2022-03-11 DIAGNOSIS — Z3A01 Less than 8 weeks gestation of pregnancy: Secondary | ICD-10-CM | POA: Insufficient documentation

## 2022-03-11 DIAGNOSIS — O26891 Other specified pregnancy related conditions, first trimester: Secondary | ICD-10-CM | POA: Diagnosis present

## 2022-03-11 DIAGNOSIS — O0001 Abdominal pregnancy with intrauterine pregnancy: Secondary | ICD-10-CM | POA: Diagnosis not present

## 2022-03-11 DIAGNOSIS — Z3490 Encounter for supervision of normal pregnancy, unspecified, unspecified trimester: Secondary | ICD-10-CM

## 2022-03-11 LAB — URINALYSIS, ROUTINE W REFLEX MICROSCOPIC
Bilirubin Urine: NEGATIVE
Glucose, UA: NEGATIVE mg/dL
Hgb urine dipstick: NEGATIVE
Ketones, ur: NEGATIVE mg/dL
Leukocytes,Ua: NEGATIVE
Nitrite: NEGATIVE
Protein, ur: NEGATIVE mg/dL
Specific Gravity, Urine: 1.02 (ref 1.005–1.030)
pH: 5 (ref 5.0–8.0)

## 2022-03-11 LAB — HCG, QUANTITATIVE, PREGNANCY: hCG, Beta Chain, Quant, S: 612 m[IU]/mL — ABNORMAL HIGH

## 2022-03-11 MED ORDER — PROGESTERONE 200 MG PO CAPS: ORAL_CAPSULE | ORAL | 3 refills | Status: AC

## 2022-03-11 NOTE — ED Triage Notes (Signed)
Pt reports that she found out last week that she is [redacted] weeks pregnant, states last pm started having and cramping, denies any vaginal bleeding, reports hx of miscarriages in the past and also states that she started breaking out in a rash after a cruise returning the 17th

## 2022-03-11 NOTE — ED Provider Notes (Signed)
[facility] Provider Note    Event Date/Time   First MD Initiated Contact with Patient 03/11/22 [phone]     (approximate)   History   Abdominal Pain and Pelvic Pain   HPI  KHRISTIN SLATE is a 37 y.o. female who presents with complaints of pelvic cramping.  Patient reports she is about [redacted] weeks pregnant, she only found out that she was pregnant 4 days ago.  Denies vaginal bleeding.  No vomiting.  Reports a history of miscarriages in the past     Physical Exam   Triage Vital Signs: ED Triage Vitals  Enc Vitals Group     BP 03/11/22 0853 125/82     Pulse Rate 03/11/22 0853 85     Resp 03/11/22 0853 16     Temp 03/11/22 0853 98.4 F (36.9 C)     Temp Source 03/11/22 0853 Oral     SpO2 03/11/22 0853 98 %     Weight 03/11/22 0854 77.1 kg (170 lb)     Height 03/11/22 0854 1.524 m (5')     Head Circumference --      Peak Flow --      Pain Score 03/11/22 0854 6     Pain Loc --      Pain Edu? --      Excl. in [place]? --     Most recent vital signs: Vitals:   03/11/22 0853 03/11/22 1321  BP: 125/82 120/78  Pulse: 85 80  Resp: 16 16  Temp: 98.4 F (36.9 C) 98 F (36.7 C)  SpO2: 98% 98%     General: Awake, no distress.  CV:  Good peripheral perfusion.  Resp:  Normal effort.  Abd:  No distention.  Soft, nontender Other:     ED Results / Procedures / Treatments   Labs (all labs ordered are listed, but only abnormal results are displayed) Labs Reviewed  URINALYSIS, ROUTINE W REFLEX MICROSCOPIC - Abnormal; Notable for the following components:      Result Value   Color, Urine YELLOW (*)    APPearance CLEAR (*)    All other components within normal limits  HCG, QUANTITATIVE, PREGNANCY - Abnormal; Notable for the following components:   hCG, Beta Chain, Quant, S 612 (*)    All other components within normal limits     EKG         PROCEDURES:  Critical Care performed:   Procedures   MEDICATIONS ORDERED IN ED: Medications - No  data to display   IMPRESSION / MDM / [place] / ED COURSE  I reviewed the triage vital signs and the nursing notes. Patient's presentation is most consistent with acute presentation with potential threat to life or bodily function.  Patient presents with pelvic pain in the setting of early pregnancy, differential includes threatened miscarriage, normal cramping of pregnancy, ectopic pregnancy  Will check beta-hCG, urinalysis obtain ultrasound and reevaluate.  Ultrasound is consistent with 5-week 1 day IUP, no fetal heart rate detected yet, likely because this is an early pregnancy, discussed with patient, outpatient follow-up recommended, appropriate for discharge at this time      FINAL CLINICAL IMPRESSION(S) / ED DIAGNOSES   Final diagnoses:  Intrauterine pregnancy     Rx / DC Orders   ED Discharge Orders     None        Note:  This document was prepared using Dragon voice recognition software and may include unintentional dictation errors.   Kayleen Alig,  Herbie [place], MD 03/11/22 1335

## 2022-03-12 ENCOUNTER — Telehealth: Payer: Self-pay | Admitting: *Deleted

## 2022-03-12 NOTE — Telephone Encounter (Signed)
Called pt back, and per ED notes, pt need follow up HCG, have pt coming in tomorrow morning for that and then will follow up after her results.

## 2022-03-12 NOTE — Telephone Encounter (Signed)
-----   Message from Seward Grater sent at 03/12/2022  9:59 AM EST ----- Regarding: Possible Miscarriage Pt called in due to cramps and went to the emergency room yesterday. Today she is actively bleeding. She would like to speak to nurse because she knows the process of having a miscarriage. She is not sure if she needs to come in or not.   Pt can reached at [phone]

## 2022-03-13 ENCOUNTER — Other Ambulatory Visit: Payer: 59

## 2022-03-13 DIAGNOSIS — O209 Hemorrhage in early pregnancy, unspecified: Secondary | ICD-10-CM

## 2022-03-14 LAB — BETA HCG QUANT (REF LAB): hCG Quant: 95 m[IU]/mL

## 2022-03-15 ENCOUNTER — Encounter (HOSPITAL_BASED_OUTPATIENT_CLINIC_OR_DEPARTMENT_OTHER): Payer: Self-pay

## 2022-03-15 NOTE — Progress Notes (Signed)
Philemon Kingdom, MD  Jessica Mcpherson, I talked to the patient and she canceled her biopsy appointment due to just finding out she is pregnant.  We will hold off on the biopsy for now, but I am thinking about trying to do so in the second trimester. Sincerely, Philemon Kingdom MD

## 2022-03-19 ENCOUNTER — Other Ambulatory Visit: Payer: 59

## 2022-03-19 DIAGNOSIS — O039 Complete or unspecified spontaneous abortion without complication: Secondary | ICD-10-CM

## 2022-03-20 ENCOUNTER — Other Ambulatory Visit: Payer: 59

## 2022-03-20 LAB — BETA HCG QUANT (REF LAB): hCG Quant: 3 m[IU]/mL

## 2022-04-03 ENCOUNTER — Ambulatory Visit (INDEPENDENT_AMBULATORY_CARE_PROVIDER_SITE_OTHER): Payer: 59 | Admitting: Advanced Practice Midwife

## 2022-04-03 ENCOUNTER — Other Ambulatory Visit (HOSPITAL_COMMUNITY)
Admission: RE | Admit: 2022-04-03 | Discharge: 2022-04-03 | Disposition: A | Discharge status: Home / Self Care | Payer: 59 | Source: Ambulatory Visit | Attending: Advanced Practice Midwife | Admitting: Advanced Practice Midwife

## 2022-04-03 ENCOUNTER — Encounter: Payer: 59 | Admitting: Advanced Practice Midwife

## 2022-04-03 VITALS — BP 131/81 | HR 93 | Wt 179.0 lb

## 2022-04-03 DIAGNOSIS — Z30430 Encounter for insertion of intrauterine contraceptive device: Secondary | ICD-10-CM

## 2022-04-03 DIAGNOSIS — N898 Other specified noninflammatory disorders of vagina: Secondary | ICD-10-CM | POA: Diagnosis not present

## 2022-04-03 MED ORDER — DIPHENHYDRAMINE-APAP (SLEEP) 25-500 MG PO TABS: 1.0000 | ORAL_TABLET | Freq: Every evening | ORAL | 0 refills | Status: DC | PRN

## 2022-04-03 MED ORDER — PARAGARD INTRAUTERINE COPPER IU IUD
1.0000 | INTRAUTERINE_SYSTEM | Freq: Once | INTRAUTERINE | Status: AC
  Administered 2022-04-03: 1 via INTRAUTERINE

## 2022-04-03 NOTE — Progress Notes (Signed)
    GYNECOLOGY OFFICE PROCEDURE NOTE  Jessica Mcpherson is a 37 y.o. [id number] here for Paragard IUD insertion. No GYN concerns.  Last pap smear was on 04/24/2021 and was normal.  Patient is s/p miscarriage. Quant hCG was 3 on 03/19/2022. She denies unprotected sex since that time. She states she misunderstood our initial questions regarding unprotected sex and endorses condom use.   IUD Insertion Procedure Note Patient identified, informed consent performed, consent signed.   Discussed risks of irregular bleeding, cramping, infection, malpositioning or misplacement of the IUD outside the uterus which may require further procedure such as laparoscopy. Also discussed >99% contraception efficacy, increased risk of ectopic pregnancy with failure of method.   Emphasized that this did not protect against STIs, condoms recommended during all sexual encounters. Time out was performed.  Urine pregnancy test negative.  Speculum placed in the vagina.  Cervix visualized.  Cleaned with Betadine x 2.  Grasped anteriorly with a single tooth tenaculum.  Uterus sounded to 3 cm.  Paragard IUD placed per manufacturer's recommendations.  Strings trimmed to 3 cm. Tenaculum was removed, good hemostasis noted.  Patient tolerated procedure well.   Patient was given post-procedure instructions.  She was advised to have backup contraception for one week.  Patient was also asked to check IUD strings periodically and follow up in 4 weeks for IUD check.   Mallie Snooks, [place], MSN, CNM Certified Nurse Midwife, Product/process development scientist for [employer], [place]

## 2022-04-03 NOTE — Progress Notes (Signed)
Patient presents for F/U SAB.  U/S on 03/11/22  Notes unprotected intercourse 1 wk ago.  Pt had questions/concerns with IUD Paragard w/ Hx of Thyroid Cancer.

## 2022-04-04 LAB — CERVICOVAGINAL ANCILLARY ONLY
Bacterial Vaginitis (gardnerella): NEGATIVE
Candida Glabrata: NEGATIVE
Candida Vaginitis: NEGATIVE
Comment: NEGATIVE
Comment: NEGATIVE
Comment: NEGATIVE

## 2022-04-07 ENCOUNTER — Emergency Department
Admission: EM | Admit: 2022-04-07 | Discharge: 2022-04-07 | Disposition: A | Discharge status: Home / Self Care | Payer: 59 | Attending: Emergency Medicine | Admitting: Emergency Medicine

## 2022-04-07 DIAGNOSIS — Z30432 Encounter for removal of intrauterine contraceptive device: Secondary | ICD-10-CM | POA: Diagnosis not present

## 2022-04-07 DIAGNOSIS — N938 Other specified abnormal uterine and vaginal bleeding: Secondary | ICD-10-CM | POA: Insufficient documentation

## 2022-04-07 DIAGNOSIS — N939 Abnormal uterine and vaginal bleeding, unspecified: Secondary | ICD-10-CM

## 2022-04-07 LAB — CBC
HCT: 44.1 % (ref 36.0–46.0)
Hemoglobin: 14.3 g/dL (ref 12.0–15.0)
MCH: 29.5 pg (ref 26.0–34.0)
MCHC: 32.4 g/dL (ref 30.0–36.0)
MCV: 91.1 fL (ref 80.0–100.0)
Platelets: 299 10*3/uL (ref 150–400)
RBC: 4.84 MIL/uL (ref 3.87–5.11)
RDW: 13.2 % (ref 11.5–15.5)
WBC: 8.3 10*3/uL (ref 4.0–10.5)
nRBC: 0 % (ref 0.0–0.2)

## 2022-04-07 LAB — POC URINE PREG, ED: Preg Test, Ur: NEGATIVE

## 2022-04-07 MED ORDER — KETOROLAC TROMETHAMINE 30 MG/ML IJ SOLN
30.0000 mg | Freq: Once | INTRAMUSCULAR | Status: AC
  Administered 2022-04-07: 30 mg via INTRAMUSCULAR
  Filled 2022-04-07: qty 1

## 2022-04-07 MED ORDER — ACETAMINOPHEN 500 MG PO TABS
1000.0000 mg | ORAL_TABLET | ORAL | Status: AC
  Administered 2022-04-07: 1000 mg via ORAL
  Filled 2022-04-07: qty 2

## 2022-04-07 NOTE — ED Triage Notes (Signed)
Pt sts that she has been having vaginal bleeding post IUD placement on 04/04/22. Pt sts that she attempted to call the OBGYN that placed device and they would not call her back on the phone. Pt sts that she went to [facility] center for woman in [place].

## 2022-04-07 NOTE — ED Provider Notes (Signed)
[facility] Provider Note    Event Date/Time   First MD Initiated Contact with Patient 04/07/22 1209     (approximate)   History   Vaginal Bleeding   HPI  Jessica Mcpherson is a 37 y.o. female G5  Patient reports she had a miscarriage earlier in the month.  She had an IUD placed on Thursday.  At the time of insertion she had some cramping and bleeding, but reports the cramping and bleeding about 1 pad per hour for the last 2 days to 3 days has persisted.  At this point she called the [facility] team but has not received a call back from the office which he presents to the ER.  No fevers or chills.  Denies abdominal pain except for crampy discomfort feels like a menstrual cycle.  She has not had resumption of her normal menses yet after pregnancy.  Patient would like to consider options to have the IUD removed and would like to consider going back to her ring contraceptive.  No weakness or lightheadedness.  No chest pain  No fevers or chills.  Reports periods slightly heavier like menstrual bleeding since IUD insertion     Physical Exam   Triage Vital Signs: ED Triage Vitals  Enc Vitals Group     BP 04/07/22 1205 (!) 140/96     Pulse Rate 04/07/22 1205 76     Resp 04/07/22 1205 17     Temp 04/07/22 1205 98.2 F (36.8 C)     Temp Source 04/07/22 1205 Oral     SpO2 04/07/22 1205 98 %     Weight 04/07/22 1206 170 lb (77.1 kg)     Height --      Head Circumference --      Peak Flow --      Pain Score 04/07/22 1206 9     Pain Loc --      Pain Edu? --      Excl. in [place]? --     Most recent vital signs: Vitals:   04/07/22 1205  BP: (!) 140/96  Pulse: 76  Resp: 17  Temp: 98.2 F (36.8 C)  SpO2: 98%     General: Awake, no distress.  Very pleasant. CV:  Good peripheral perfusion.  Resp:  Normal effort.  Abd:  No distention.  Abdomen soft nontender nondistended through all quadrants without peritonitis with exception to mild discomfort over the  suprapubic region.  She reports it feels crampy in nature Other:  Deferred to GYN team will be evaluation for pelvic   ED Results / Procedures / Treatments   Labs (all labs ordered are listed, but only abnormal results are displayed) Labs Reviewed  CBC  POC URINE PREG, ED     EKG     RADIOLOGY     PROCEDURES:  Critical Care performed: No  Procedures   MEDICATIONS ORDERED IN ED: Medications  ketorolac (TORADOL) 30 MG/ML injection 30 mg (has no administration in time range)  acetaminophen (TYLENOL) tablet 1,000 mg (has no administration in time range)     IMPRESSION / MDM / ASSESSMENT AND PLAN / ED COURSE  I reviewed the triage vital signs and the nursing notes.                              Differential diagnosis includes, but is not limited to, cramping, resumption of menses, injury bleeding or other concern post IUD insertion.  No peritonitis no severe abdominal pain or component of abdominal pain that would be suggestive of acute perforation.  She does report bleeding, and crampy discomfort.  Hemodynamics are normal.  She has no clinical symptoms of acute anemia.  CBC is normal.  No infectious symptoms.  Consult placed with OB/GYN Dr. Amalia Hailey and Maggie Font. Discussed with Tomi Bamberger and she will see, eval and provide consult from [facility] team (1245p)   Patient's presentation is most consistent with acute complicated illness / injury requiring diagnostic workup.   ----------------------------------------- 1:30 PM on 04/07/2022 ----------------------------------------- Patient reports feeling improved.  Still some ongoing cramping, no heavy bleeding at this point and reports the cramping already seems to be subsiding.  Still amenable to receiving endorses Toradol and Tylenol.  She was seen and had her IUD removed by her OB/GYN service, they advised no need for imaging and recommend outpatient follow-up with bleeding precautions which I have advised the patient.  Return  precautions and treatment recommendations and follow-up discussed with the patient who is agreeable with the plan.  Patient aware she currently does not have a contraceptive, she will follow-up with [facility] regarding this as well      FINAL CLINICAL IMPRESSION(S) / ED DIAGNOSES   Final diagnoses:  Encounter for IUD removal  Abnormal vaginal bleeding     Rx / DC Orders   ED Discharge Orders     None        Note:  This document was prepared using Dragon voice recognition software and may include unintentional dictation errors.   Delman Kitten, MD 04/07/22 1331

## 2022-04-07 NOTE — Consult Note (Signed)
OB/Triage Note  Patient ID: Jessica Mcpherson MRN: [id number] DOB/AGE: [birth year] 37 y.o.  Subjective  History of Present Illness: The patient is a 37 y.o. female who presents for IUD removal. Had paraguard IUD placed on 04/03/22 by Mallie Snooks CNM at [facility]. Jessica Mcpherson called the clinic where it was placed and they told her she could not be seen until April, this was too long for Jessica Mcpherson so she chose to come to the ER.  Since its placing she reports bleeding and cramping. Every time she uses the restroom she passes clots that are about a quarter in size, also reports pads are about 65% saturated each time. Has had increased cramping as well. She would like to go back to using the nuvaring which she already has at home.  Jessica Mcpherson unfortunately recently suffered a miscarriage. Her miscarriage symptoms began 3/1, she bled for about one week. Her bhcg levels were back to prepregnancy range before IUD placement.  Past Medical History:  Diagnosis Date   Anxiety    Arthritis    Right knee   Depression    GERD (gastroesophageal reflux disease)    Headache    Hypertension    Miscarriage    x2   Papillary thyroid carcinoma ([place])     Past Surgical History:  Procedure Laterality Date   CESAREAN SECTION     LAPAROSCOPIC APPENDECTOMY N/A 12/30/2013   Procedure: APPENDECTOMY LAPAROSCOPIC;  Surgeon: Jackolyn Confer, MD;  Location: [facility];  Service: General;  Laterality: N/A;   THYROIDECTOMY N/A 07/22/2019   Procedure: TOTAL THYROIDECTOMY WITH CENTRAL COMPARTMENT LYMPH NODE DISSECTION ZONE VI;  Surgeon: Armandina Gemma, MD;  Location: [facility];  Service: General;  Laterality: N/A;    No current facility-administered medications on file prior to encounter.   Current Outpatient Medications on File Prior to Encounter  Medication Sig Dispense Refill   progesterone (PROMETRIUM) 200 MG capsule Place one capsule vaginally at bedtime (Patient not taking: Reported on 04/03/2022) 30 capsule 3    acetaminophen (TYLENOL) 500 MG tablet Take 1,000 mg by mouth every 6 (six) hours as needed for moderate pain. (Patient not taking: Reported on 04/03/2022)     ALPRAZolam (XANAX) 0.25 MG tablet Take 1 tablet (0.25 mg total) by mouth 2 (two) times daily as needed (panic attacks). (Patient not taking: Reported on 04/03/2022) 20 tablet 0   benzonatate (TESSALON) 100 MG capsule Take 1-2 tablets 3 times a day as needed for cough (Patient not taking: Reported on 04/03/2022) 30 capsule 0   buPROPion (WELLBUTRIN XL) 150 MG 24 hr tablet Take 1 tablet (150 mg total) by mouth daily. (Patient not taking: Reported on 04/03/2022) 30 tablet 3   etonogestrel-ethinyl estradiol (NUVARING) 0.12-0.015 MG/24HR vaginal ring Insert vaginally and leave in place for 3 consecutive weeks, then remove for 1 week. (Patient not taking: Reported on 04/03/2022) 3 each 4   levothyroxine (SYNTHROID) 137 MCG tablet Take 1 tablet (137 mcg total) by mouth daily before breakfast. 90 tablet 1   Multiple Vitamin (MULTIVITAMIN WITH MINERALS) TABS tablet Take 1 tablet by mouth daily.     scopolamine (TRANSDERM SCOP, 1.5 MG,) 1 MG/3DAYS Place 1 patch (1.5 mg total) onto the skin every 3 (three) days. (Patient not taking: Reported on 04/03/2022) 3 patch 0   traZODone (DESYREL) 50 MG tablet Take 100 mg by mouth at bedtime as needed for sleep. (Patient not taking: Reported on 04/03/2022)     Vitamin D, Ergocalciferol, (DRISDOL) 1.25 MG (50000 UNIT) CAPS capsule Take  1 capsule (50,000 Units total) by mouth every 7 (seven) days. 12 capsule 3    Allergies  Allergen Reactions   Penicillins Hives and Other (See Comments)    CHILDHOOD ALLERGY Has patient had a PCN reaction causing immediate rash, facial/tongue/throat swelling, SOB or lightheadedness with hypotension: YES Has patient had a PCN reaction causing severe rash involving mucus membranes or skin necrosis: UNKNOWN Has patient had a PCN reaction that required hospitalization: YES Has patient had a  PCN reaction occurring within the last 10 years: No If all of the above answers are "NO", then may proceed with Cephalosporin use. 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  Tobacco Use   Smoking status: Every Day    Packs/day: .1    Types: Cigarettes   Smokeless tobacco: Never  Vaping Use   Vaping Use: Never used  Substance and Sexual Activity   Alcohol use: No    Alcohol/week: 0.0 standard drinks of alcohol   Drug use: No   Sexual activity: Yes    Partners: Male    Birth control/protection: None  Other Topics Concern   Not on file  Social History Narrative   Not on file   Social Determinants of Health   Financial Resource Strain: Not on file  Food Insecurity: Not on file  Transportation Needs: Not on file  Physical Activity: Not on file  Stress: Not on file  Social Connections: Not on file  Intimate Partner Violence: Not on file    Family History  Problem Relation Age of Onset   Diabetes Mother    Hypertension Mother    Asthma Mother    Diabetes Maternal Grandmother    Hypertension Maternal Grandmother    Arthritis Maternal Grandmother    Heart disease Maternal Grandmother    Diabetes Maternal Grandfather    Arthritis Maternal Grandfather    Heart disease Maternal Grandfather    Hypertension Father      ROS    Objective  Physical Exam: BP (!) 140/96 (BP Location: Left Arm)   Pulse 76   Temp 98.2 F (36.8 C) (Oral)   Resp 17   Wt 77.1 kg   SpO2 98%   BMI 33.20 kg/m    Constitutional: NAD, alert, cooperative GYN: external genitalia WNL. Moderate lochia rubra present in vaginal vault, one small clot present, cervical os visualized with white string present, os closed. Cervix  without lesions. IUD easily removed with ringed forceps, patient tolerated procedure.  Hgb 14.3  Hospital Course: Called to ER by Dr. Delman Kitten for consult regarding patient. Per patient preference IUD was removed. Procedure well tolerated. Dr. Jacqualine Code notified of patient condition, anticipate d/c home.   Assessment: 37 y.o. female s/p recent miscarriage Hemodynamically stable IUD removed  Plan: Plan Discharge home Plans nuvaring for contraception Teaching: may have light bleeding for a few days, can take ibuprofen for comfort     Total time spent taking care of this patient: 30 minutes  Signed: Maggie Font CNM, FNP 04/07/2022, 1:28 PM

## 2022-04-07 NOTE — Discharge Instructions (Addendum)
Please follow-up with [facility] health obstetrics and gynecology to discuss plans for contraceptive.  Your IUD was removed today.  Please follow up closely with obstetrics and gynecology or your primary doctor.  Return to the emergency room if your bleeding worsens, you become weak and dizzy or lightheaded, you have an episode of passing out, develop severe bleeding such as more than 1 soaked pad per hour for more than 3 straight hours, develop abdominal or pelvic pain, fevers chills or other new concerns arise.

## 2022-04-14 ENCOUNTER — Ambulatory Visit: Payer: 59

## 2022-04-22 ENCOUNTER — Ambulatory Visit
Admission: EM | Admit: 2022-04-22 | Discharge: 2022-04-22 | Disposition: A | Discharge status: Home / Self Care | Payer: 59 | Attending: Urgent Care | Admitting: Urgent Care

## 2022-04-22 DIAGNOSIS — M25511 Pain in right shoulder: Secondary | ICD-10-CM

## 2022-04-22 MED ORDER — CYCLOBENZAPRINE HCL 10 MG PO TABS: 10.0000 mg | ORAL_TABLET | Freq: Every day | ORAL | 0 refills | Status: DC

## 2022-04-22 MED ORDER — PREDNISONE 20 MG PO TABS: ORAL_TABLET | ORAL | 0 refills | Status: AC

## 2022-04-22 NOTE — ED Provider Notes (Signed)
Renaldo Fiddler    CSN: [id number] Arrival date & time: 04/22/22  2536      History   Chief Complaint Chief Complaint  Patient presents with   Shoulder Pain    HPI Jessica Mcpherson is a 37 y.o. female.    Shoulder Pain   She presents to urgent care with complaint of right shoulder pain x 4 days.  Denies specific injury or known precipitating event.  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  She also states she works out at [employer] and possibly did heavy shoulder work last week.  She states pain is worse when she flexes her muscle.  She reports relieved pain if she lifts her arms above a certain level.  She is using ibuprofen with minimal relief.  Endorses history of anxiety depression and panic.  MAR shows bupropion and Xanax.  Past Medical History:  Diagnosis Date   Anxiety    Arthritis    Right knee   Depression    GERD (gastroesophageal reflux disease)    Headache    Hypertension    Miscarriage    x2   Papillary thyroid carcinoma     Patient Active Problem List   Diagnosis Date Noted   Encounter for IUD insertion 04/03/2022   Travel advice encounter 02/12/2022   Encounter for general adult medical examination with abnormal findings 01/02/2022   Abdominal pain 10/28/2021   Urinary frequency 10/28/2021   Syncope 06/13/2021   Insomnia 11/07/2020   Gonorrhea 12/26/2019   Papanicolaou smear of cervix with positive high risk human papilloma virus (HPV) test 12/26/2019   Papillary thyroid carcinoma 05/03/2019   Essential hypertension 05/03/2019   Tobacco abuse 05/03/2019   Depression 05/03/2019   Right rotator cuff tendonitis 01/19/2019   Synovitis of right shoulder 01/19/2019   GAD (generalized anxiety disorder) 08/27/2017    Past Surgical History:  Procedure Laterality Date   CESAREAN SECTION     LAPAROSCOPIC APPENDECTOMY N/A 12/30/2013   Procedure: APPENDECTOMY LAPAROSCOPIC;  Surgeon: Avel Peace, MD;   Location: [facility];  Service: General;  Laterality: N/A;   THYROIDECTOMY N/A 07/22/2019   Procedure: TOTAL THYROIDECTOMY WITH CENTRAL COMPARTMENT LYMPH NODE DISSECTION ZONE VI;  Surgeon: Darnell Level, MD;  Location: [facility];  Service: General;  Laterality: N/A;    OB History     Gravida  5   Para  1   Term  1   Preterm      AB  2   Living  1      SAB  1   IAB      Ectopic      Multiple      Live Births  1            Home Medications    Prior to Admission medications   Medication Sig Start Date End Date Taking? Authorizing Provider  progesterone (PROMETRIUM) 200 MG capsule Place one capsule vaginally at bedtime Patient not taking: Reported on 04/03/2022 03/11/22   Tereso Newcomer, MD  acetaminophen (TYLENOL) 500 MG tablet Take 1,000 mg by mouth every 6 (six) hours as needed for moderate pain. Patient not taking: Reported on 04/03/2022    [provider]  ALPRAZolam Prudy Feeler) 0.25 MG tablet Take 1 tablet (0.25 mg total) by mouth 2 (two) times daily as needed (panic attacks). Patient not taking: Reported on 04/03/2022 01/01/22   Myrlene Broker, MD  benzonatate (TESSALON) 100 MG capsule Take 1-2  tablets 3 times a day as needed for cough Patient not taking: Reported on 04/03/2022 03/05/22   Lan Mcneill, Jeannett Senior, FNP  buPROPion (WELLBUTRIN XL) 150 MG 24 hr tablet Take 1 tablet (150 mg total) by mouth daily. Patient not taking: Reported on 04/03/2022 01/01/22   Myrlene Broker, MD  etonogestrel-ethinyl estradiol (NUVARING) 0.12-0.015 MG/24HR vaginal ring Insert vaginally and leave in place for 3 consecutive weeks, then remove for 1 week. Patient not taking: Reported on 04/03/2022 05/31/21   Federico Flake, MD  levothyroxine (SYNTHROID) 137 MCG tablet Take 1 tablet (137 mcg total) by mouth daily before breakfast. 02/17/22   Carlus Pavlov, MD  Multiple Vitamin (MULTIVITAMIN WITH MINERALS) TABS tablet Take 1 tablet by mouth daily.    [provider]  scopolamine (TRANSDERM SCOP, 1.5 MG,) 1 MG/3DAYS Place 1 patch (1.5 mg total) onto the skin every 3 (three) days. Patient not taking: Reported on 04/03/2022 02/12/22   Myrlene Broker, MD  traZODone (DESYREL) 50 MG tablet Take 100 mg by mouth at bedtime as needed for sleep. Patient not taking: Reported on 04/03/2022    [provider]  Vitamin D, Ergocalciferol, (DRISDOL) 1.25 MG (50000 UNIT) CAPS capsule Take 1 capsule (50,000 Units total) by mouth every 7 (seven) days. 06/13/21   Myrlene Broker, MD    Family History Family History  Problem Relation Age of Onset   Diabetes Mother    Hypertension Mother    Asthma Mother    Diabetes Maternal Grandmother    Hypertension Maternal Grandmother    Arthritis Maternal Grandmother    Heart disease Maternal Grandmother    Diabetes Maternal Grandfather    Arthritis Maternal Grandfather    Heart disease Maternal Grandfather    Hypertension Father     Social History Social History   Tobacco Use   Smoking status: Every Day    Packs/day: .1    Types: Cigarettes   Smokeless tobacco: Never  Vaping Use   Vaping Use: Never used  Substance Use Topics   Alcohol use: No    Alcohol/week: 0.0 standard drinks of alcohol   Drug use: No     Allergies   Penicillins and Pepperoni [pickled meat]   Review of Systems Review of Systems   Physical Exam Triage Vital Signs ED Triage Vitals  Enc Vitals Group     BP 04/22/22 1003 135/87     Pulse Rate 04/22/22 1003 63     Resp 04/22/22 1003 18     Temp 04/22/22 1003 98.2 F (36.8 C)     Temp Source 04/22/22 1003 Oral     SpO2 04/22/22 1003 98 %     Weight --      Height --      Head Circumference --      Peak Flow --      Pain Score 04/22/22 1000 7     Pain Loc --      Pain Edu? --      Excl. in GC? --    No data found.  Updated Vital Signs BP 135/87 (BP Location: Left Arm)   Pulse 63   Temp 98.2 F (36.8 C) (Oral)   Resp 18   LMP 03/22/2022   SpO2 98%    Visual Acuity Right Eye Distance:   Left Eye Distance:   Bilateral Distance:    Right Eye Near:   Left Eye Near:    Bilateral Near:     Physical Exam Vitals reviewed.  Constitutional:  Appearance: Normal appearance.  Musculoskeletal:     Comments: Positive empty can test.  Tenderness with resisted arm elevation.  Pain with elevation up to 90 to 100 degrees, then relieved pain above 100 degrees.  Skin:    General: Skin is warm and dry.  Neurological:     General: No focal deficit present.     Mental Status: She is alert and oriented to person, place, and time.  Psychiatric:        Mood and Affect: Mood normal.        Behavior: Behavior normal.      UC Treatments / Results  Labs (all labs ordered are listed, but only abnormal results are displayed) Labs Reviewed - No data to display  EKG   Radiology No results found.  Procedures Procedures (including critical care time)  Medications Ordered in UC Medications - No data to display  Initial Impression / Assessment and Plan / UC Course  I have reviewed the triage vital signs and the nursing notes.  Pertinent labs & imaging results that were available during my care of the patient were reviewed by me and considered in my medical decision making (see chart for details).   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  Will prescribe muscle relaxant for patient at her request for nighttime.  We also discussed use of corticosteroid and I will cautiously prescribe this though the patient endorses history of panic treated with Xanax.  She will stop using this medication if she has significant side effects.  She will follow-up with orthopedic provider if her symptoms recur or do not resolve.  Counseled patient on potential for adverse effects with medications prescribed/recommended today, ER and return-to-clinic  precautions discussed, patient verbalized understanding and agreement with care plan.    Final Clinical Impressions(s) / UC Diagnoses   Final diagnoses:  None   Discharge Instructions   None    ED Prescriptions   None    PDMP not reviewed this encounter.   Charma Igommordino, Simran Mannis, Oregon[place]NP 04/22/22 1032

## 2022-04-22 NOTE — ED Triage Notes (Signed)
Pt presents with complaints of right shoulder pain since Friday. 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 Pain is worse when she flexes the muscle and moves her shoulder up. Pt has used ibuprofen and freeze patch with no relief.

## 2022-04-22 NOTE — Discharge Instructions (Addendum)
Stop using prednisone if you have significant symptoms of anxiety or panic while taking.  Take with food and consider using antiacid medication to prevent indigestion.  Follow up with your primary care or orthopedic provider if your symptoms are worsening or not improving.

## 2022-04-29 ENCOUNTER — Ambulatory Visit: Payer: 59 | Admitting: Obstetrics & Gynecology

## 2022-04-30 ENCOUNTER — Encounter: Payer: Self-pay | Admitting: Internal Medicine

## 2022-05-01 ENCOUNTER — Encounter: Payer: Self-pay | Admitting: Internal Medicine

## 2022-05-01 ENCOUNTER — Ambulatory Visit (INDEPENDENT_AMBULATORY_CARE_PROVIDER_SITE_OTHER): Payer: 59 | Admitting: Internal Medicine

## 2022-05-01 ENCOUNTER — Ambulatory Visit: Payer: 59 | Admitting: Internal Medicine

## 2022-05-01 VITALS — BP 128/76 | HR 73 | Ht 60.0 in | Wt 180.6 lb

## 2022-05-01 VITALS — BP 124/62 | HR 77 | Temp 98.5°F | Ht 60.0 in | Wt 181.0 lb

## 2022-05-01 DIAGNOSIS — I10 Essential (primary) hypertension: Secondary | ICD-10-CM

## 2022-05-01 DIAGNOSIS — R0683 Snoring: Secondary | ICD-10-CM | POA: Diagnosis not present

## 2022-05-01 DIAGNOSIS — R5383 Other fatigue: Secondary | ICD-10-CM

## 2022-05-01 DIAGNOSIS — E890 Postprocedural hypothyroidism: Secondary | ICD-10-CM | POA: Diagnosis not present

## 2022-05-01 DIAGNOSIS — R7303 Prediabetes: Secondary | ICD-10-CM

## 2022-05-01 DIAGNOSIS — C73 Malignant neoplasm of thyroid gland: Secondary | ICD-10-CM

## 2022-05-01 LAB — VITAMIN B12: Vitamin B-12: 619 pg/mL (ref 211–911)

## 2022-05-01 LAB — LIPID PANEL
Cholesterol: 183 mg/dL (ref 0–200)
HDL: 54.9 mg/dL (ref >39.00)
LDL Cholesterol: 95 mg/dL (ref 0–99)
NonHDL: 128.19
Total CHOL/HDL Ratio: 3
Triglycerides: 167 mg/dL — ABNORMAL HIGH (ref 0.0–149.0)
VLDL: 33.4 mg/dL (ref 0.0–40.0)

## 2022-05-01 LAB — HEMOGLOBIN A1C: Hgb A1c MFr Bld: 5.9 % (ref 4.6–6.5)

## 2022-05-01 LAB — VITAMIN D 25 HYDROXY (VIT D DEFICIENCY, FRACTURES): VITD: 46.06 ng/mL (ref 30.00–100.00)

## 2022-05-01 LAB — T4, FREE: Free T4: 0.64 ng/dL (ref 0.60–1.60)

## 2022-05-01 LAB — FERRITIN: Ferritin: 13.8 ng/mL (ref 10.0–291.0)

## 2022-05-01 LAB — TSH: TSH: 23.54 u[IU]/mL — ABNORMAL HIGH (ref 0.35–5.50)

## 2022-05-01 NOTE — Patient Instructions (Signed)
Please stop at the lab.  Please continue levothyroxine 137 mcg daily.  Take the thyroid hormone every day, with water, at least 30 minutes before breakfast, separated by at least 4 hours from: - acid reflux medications - calcium - iron - multivitamins  Please come back for a follow-up appointment in 6 months.  

## 2022-05-01 NOTE — Progress Notes (Signed)
   Subjective:   Patient ID: Jessica Mcpherson, female    DOB: 02-Nov-1985, 37 y.o.   MRN: [id number]  HPI The patient is a 37 YO female coming in for snoring and fatigue and daytime sleepiness. Up 20 pounds since last year.   Review of Systems  Constitutional:  Positive for fatigue and unexpected weight change.  HENT: Negative.    Eyes: Negative.   Respiratory:  Negative for cough, chest tightness and shortness of breath.   Cardiovascular:  Negative for chest pain, palpitations and leg swelling.  Gastrointestinal:  Negative for abdominal distention, abdominal pain, constipation, diarrhea, nausea and vomiting.  Musculoskeletal: Negative.   Skin: Negative.   Neurological: Negative.   Psychiatric/Behavioral: Negative.      Objective:  Physical Exam Constitutional:      Appearance: She is well-developed.  HENT:     Head: Normocephalic and atraumatic.  Cardiovascular:     Rate and Rhythm: Normal rate and regular rhythm.  Pulmonary:     Effort: Pulmonary effort is normal. No respiratory distress.     Breath sounds: Normal breath sounds. No wheezing or rales.  Abdominal:     General: Bowel sounds are normal. There is no distension.     Palpations: Abdomen is soft.     Tenderness: There is no abdominal tenderness. There is no rebound.  Musculoskeletal:     Cervical back: Normal range of motion.  Skin:    General: Skin is warm and dry.  Neurological:     Mental Status: She is alert and oriented to person, place, and time.     Coordination: Coordination normal.     Vitals:   05/01/22 1107  BP: 124/62  Pulse: 77  Temp: 98.5 F (36.9 C)  TempSrc: Oral  SpO2: 96%  Weight: 181 lb (82.1 kg)  Height: 5' (1.524 m)    Assessment & Plan:

## 2022-05-01 NOTE — Progress Notes (Signed)
Patient ID: Jessica Mcpherson, female   DOB: May 24, 1985, 37 y.o.   MRN: [id number]    HPI  Jessica Mcpherson is a 37 y.o.-year-old female, referred by her PCP, Dr. Okey Dupre, for management of thyroid cancer and postsurgical hypothyroidism.  Last visit 5 months ago.  Interim hx: She no showed 3x for the lymph node biopsy.  The last no-show was after she was found to be to be pregnant, but she had a miscarriage since.  She mentions she was not taking her OCPs consistently at that time but she is doing so now.  She would not want another pregnancy for now. She has fatigue, weight gain (15 lbs since last OV), hair thinning.  Reviewed history: Pt. has been dx with thyroid cancer in 03/2019.   Reviewed her thyroid cancer history: 03/2019: Presented to UC for shoulder pain >> neck fullness on palpation  03/24/2019: Thyroid ultrasound: 3.4 x 1.5 x 2.1 cm solid left mid hypoechoic thyroid nodule with microcalcifications.  04/14/2019: FNA of the nodule: Suspicious for PTC  07/22/2019: Total thyroidectomy with central (zone 6) lymph node dissection:  A. THYROID GLAND, TOTAL, THYROIDECTOMY:  - Papillary thyroid carcinoma, multifocal, 2.4 cm in greatest dimension,  with angioinvasion, extrathyroidal extension and margin involvement.  - See oncology table.  B. LYMPH NODES, CENTRAL COMPARTMENT/ZONE VI, REGIONAL RESECTION:  - Metastatic carcinoma in (2) of (2) lymph nodes with extranodal  extension.  - Thymic tissue with focal involvement of peri-thymic connective tissue  by carcinoma.   ONCOLOGY TABLE:  THYROID GLAND:  Procedure: Total thyroidectomy  Tumor Focality: Multifocal  Tumor Site: Left lobe, right lobe  Tumor Size: Greatest dimension: 2.4 cm  Histologic Type: Papillary carcinoma  Margins: Positive for carcinoma in area of extrathyroidal extension (left lobe)  Angioinvasion: Present  Lymphatic Invasion: Present  Extrathyroidal Extension: Present  Regional Lymph Nodes:       Number of Lymph  Nodes Involved: 2       Nodal Levels Involved: Central compartment/Zone VI       Size of Largest Metastatic Deposit: 1 cm       Extranodal Extension (ENE): Present       Number of Lymph Nodes Examined: 2       Nodal Levels Examined: Central compartment/Zone VI  Pathologic Stage Classification (pTNM, AJCC 8th Edition): mpT3, pN1a  Representative Tumor Block: A2  Comment: Case discussed with Dr. Darnell Level on 07/28/2019.  Dr. Luisa Hart reviewed select slides.   07/27/2019: CT neck obtained for swelling and pain: edema at thyroid site, no abscess.  10/19/2019: RAI treatment - 124.1 mCi I-131 sodium iodide  10/28/2019: Posttreatment whole-body scan:  1. Expected tracer uptake identified within the thyroid bed compatible with residual functioning thyroid tissue. No signs of I-131 avid nodal metastasis or distant metastatic disease.  Neck U/S (03/01/2021): Soft tissue mass in the left neck near the surgical bed -potential residual/recurrent tissue  CT neck (03/22/2021): Mildly increased lymph node in the left jugular chain, suspicious for lymph node metastasis, but unchanged since 2021  I recommended a core biopsy of the enlarged lymph node but she no showed 2x for the appointment.  Reviewed previous thyroglobulin and ATA antibody levels: Lab Results  Component Value Date   THYROGLB 5.9 12/26/2021   THYROGLB 4.6 12/26/2020   THYROGLB 9.2 07/04/2020   THYROGLB 10.0 12/28/2019   Lab Results  Component Value Date   THGAB 1 12/26/2021   THGAB <1 12/26/2020   THGAB <1 07/04/2020   THGAB <1 12/28/2019  Pt denies: - feeling nodules in neck - hoarseness - dysphagia (only with pills) - choking  Calcium was normal after surgery: Lab Results  Component Value Date   CALCIUM 9.1 12/20/2021   CALCIUM 9.1 10/28/2021   CALCIUM 9.4 06/12/2021   CALCIUM 9.6 11/07/2020   CALCIUM 9.5 12/07/2019   CALCIUM 9.1 07/27/2019   CALCIUM 9.2 07/23/2019   CALCIUM 9.2 06/17/2019   CALCIUM 9.0  10/12/2018   CALCIUM 9.2 07/19/2018  She was on calcium supplements postop, now off.  Postsurgical hypothyroidism  Pt is on levothyroxine 137 mcg daily: - not missing doses - in am - fasting - at least 30 min from b'fast - no calcium - no iron - + MVI later in the day (at night) - not lately  - no PPIs - not on Biotin  Reviewed her TFTs: Lab Results  Component Value Date   TSH 1.60 02/12/2022   TSH 10.81 (H) 12/26/2021   TSH 1.79 07/24/2021   TSH 3.93 06/12/2021   TSH 1.19 12/26/2020   TSH 0.88 10/17/2020   TSH 2.58 07/04/2020   TSH 0.83 05/03/2020   TSH 0.23 (L) 02/28/2020   TSH 0.05 (L) 12/28/2019   FREET4 0.93 02/12/2022   FREET4 0.81 12/26/2021   FREET4 1.10 07/24/2021   FREET4 0.87 06/12/2021   FREET4 0.93 12/26/2020   FREET4 0.77 10/17/2020   FREET4 0.98 07/04/2020   FREET4 1.09 05/03/2020   FREET4 1.10 02/28/2020   FREET4 1.12 12/28/2019  04/12/2019: TSH 3.194 01/04/2019: TSH 1.11  She has + FH of thyroid disorders in: MGM - thyroid nodule.  No family history of thyroid cancer.  No history of radiation to head or neck other than RAI treatment. No herbal supplements. No Biotin use. She had a knee steroid inj >2 weeks ago.  She also has a history of HTN, depression/anxiety. She contacted me that she was pregnant in 10/2020.  However, she had a miscarriage in 11/2020.  She also contacted me that she was pregnant in 02/2022, however, she had a miscarriage very soon afterwards.  ROS: + see HPI   I reviewed pt's medications, allergies, PMH, social hx, family hx, and changes were documented in the history of present illness. Otherwise, unchanged from my initial visit note.  Past Medical History:  Diagnosis Date   Anxiety    Arthritis    Right knee   Depression    GERD (gastroesophageal reflux disease)    Headache    Hypertension    Miscarriage    x2   Papillary thyroid carcinoma    Past Surgical History:  Procedure Laterality Date   CESAREAN  SECTION     LAPAROSCOPIC APPENDECTOMY N/A 12/30/2013   Procedure: APPENDECTOMY LAPAROSCOPIC;  Surgeon: Avel Peace, MD;  Location: [facility];  Service: General;  Laterality: N/A;   THYROIDECTOMY N/A 07/22/2019   Procedure: TOTAL THYROIDECTOMY WITH CENTRAL COMPARTMENT LYMPH NODE DISSECTION ZONE VI;  Surgeon: Darnell Level, MD;  Location: [facility];  Service: General;  Laterality: N/A;   Social History   Socioeconomic History   Marital status: Single    Spouse name: Not on file   Number of children: 1   Years of education: Not on file   Highest education level: Not on file  Occupational History   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  Tobacco Use   Smoking status: Every Day    Packs/day: .1    Types: Cigarettes   Smokeless tobacco: Never  Vaping Use   Vaping Use: Never used  Substance  and Sexual Activity   Alcohol use: No    Alcohol/week: 0.0 standard drinks of alcohol   Drug use: No   Sexual activity: Yes    Partners: Male    Birth control/protection: None  Other Topics Concern   Not on file  Social History Narrative   Not on file   Social Determinants of Health   Financial Resource Strain: Not on file  Food Insecurity: Not on file  Transportation Needs: Not on file  Physical Activity: Not on file  Stress: Not on file  Social Connections: Not on file  Intimate Partner Violence: Not on file   Current Outpatient Medications on File Prior to Visit  Medication Sig Dispense Refill   progesterone (PROMETRIUM) 200 MG capsule Place one capsule vaginally at bedtime (Patient not taking: Reported on 04/03/2022) 30 capsule 3   acetaminophen (TYLENOL) 500 MG tablet Take 1,000 mg by mouth every 6 (six) hours as needed for moderate pain. (Patient not taking: Reported on 04/03/2022)     ALPRAZolam (XANAX) 0.25 MG tablet Take 1 tablet (0.25 mg total) by mouth 2 (two) times daily as needed (panic attacks). (Patient not taking: Reported on 04/03/2022) 20 tablet 0   benzonatate (TESSALON) 100 MG capsule  Take 1-2 tablets 3 times a day as needed for cough (Patient not taking: Reported on 04/03/2022) 30 capsule 0   buPROPion (WELLBUTRIN XL) 150 MG 24 hr tablet Take 1 tablet (150 mg total) by mouth daily. (Patient not taking: Reported on 04/03/2022) 30 tablet 3   cyclobenzaprine (FLEXERIL) 10 MG tablet Take 1 tablet (10 mg total) by mouth at bedtime. 20 tablet 0   etonogestrel-ethinyl estradiol (NUVARING) 0.12-0.015 MG/24HR vaginal ring Insert vaginally and leave in place for 3 consecutive weeks, then remove for 1 week. (Patient not taking: Reported on 04/03/2022) 3 each 4   levothyroxine (SYNTHROID) 137 MCG tablet Take 1 tablet (137 mcg total) by mouth daily before breakfast. 90 tablet 1   Multiple Vitamin (MULTIVITAMIN WITH MINERALS) TABS tablet Take 1 tablet by mouth daily.     scopolamine (TRANSDERM SCOP, 1.5 MG,) 1 MG/3DAYS Place 1 patch (1.5 mg total) onto the skin every 3 (three) days. (Patient not taking: Reported on 04/03/2022) 3 patch 0   traZODone (DESYREL) 50 MG tablet Take 100 mg by mouth at bedtime as needed for sleep. (Patient not taking: Reported on 04/03/2022)     Vitamin D, Ergocalciferol, (DRISDOL) 1.25 MG (50000 UNIT) CAPS capsule Take 1 capsule (50,000 Units total) by mouth every 7 (seven) days. 12 capsule 3   No current facility-administered medications on file prior to visit.   Allergies  Allergen Reactions   Penicillins Hives and Other (See Comments)    CHILDHOOD ALLERGY Has patient had a PCN reaction causing immediate rash, facial/tongue/throat swelling, SOB or lightheadedness with hypotension: YES Has patient had a PCN reaction causing severe rash involving mucus membranes or skin necrosis: UNKNOWN Has patient had a PCN reaction that required hospitalization: YES Has patient had a PCN reaction occurring within the last 10 years: No If all of the above answers are "NO", then may proceed with Cephalosporin use. Received Ceftriaxone 1g IV 12/30/13   Pepperoni [Pickled Meat] Hives    Family History  Problem Relation Age of Onset   Diabetes Mother    Hypertension Mother    Asthma Mother    Diabetes Maternal Grandmother    Hypertension Maternal Grandmother    Arthritis Maternal Grandmother    Heart disease Maternal Grandmother    Diabetes  Maternal Grandfather    Arthritis Maternal Grandfather    Heart disease Maternal Grandfather    Hypertension Father   + See HPI  PE: BP 128/76 (BP Location: Left Arm, Patient Position: Sitting, Cuff Size: Normal)   Pulse 73   Ht 5' (1.524 m)   Wt 180 lb 9.6 oz (81.9 kg)   LMP 03/22/2022   SpO2 98%   BMI 35.27 kg/m  Wt Readings from Last 3 Encounters:  05/01/22 180 lb 9.6 oz (81.9 kg)  04/07/22 170 lb (77.1 kg)  04/03/22 179 lb (81.2 kg)   Constitutional: overweight, in NAD Eyes:  EOMI, no exophthalmos ENT: no neck masses, + thyroidectomy scar healed, with minimal keloid, no cervical lymphadenopathy Cardiovascular: RRR, No MRG Respiratory: CTA B Musculoskeletal: no deformities Skin:no rashes Neurological: no tremor with outstretched hands  ASSESSMENT: 1. Thyroid cancer - see HPI  2. Postsurgical Hypothyroidism  PLAN:  1.  Papillary thyroid cancer -Patient with clinical stage I TNM thyroid cancer due to age (since no distant metastasis confirmed so far), but pathologically pT3N1a -We discussed that overall, papillary thyroid cancer has a good prognosis -Her tumor was larger than 1.5 cm, multifocal, and had lymphovascular invasion and extension outside the thyroid so RAI treatment was indicated.  She had this in 10/2019.  Posttreatment whole-body scan showed no metastasis or abnormal uptake site -However, the latest ultrasound (02/2021) showed a soft tissue mass in the left neck, near the surgical bed.  We checked a CT of the neck and this showed an enlarged lymph node in the left jugular chain, suspicious for metastasis.  However, this was stable from 2021.  I still recommended a biopsy - ordered a core biopsy at  the hospital but she no showed for this procedure 3 times.  The last time, she was pregnant, but since then, unfortunately, she had a scare each -Of note, her thyroglobulin level was 5.9, and ATA antibodies 1 at last check, however, at that time, TSH was also elevated, at 10.8. -Will repeat these today -At today's visit, she agrees to have the core biopsy done.  Since it has been more than a year since the last ultrasound, will check this first.  However, I advised her that if she misses the biopsy appointment, unfortunately, we will not schedule it anymore. -I we will see her back in 6 months  2.  Patient with history of thyroidectomy for thyroid cancer, now with iatrogenic hypothyroidism, on levothyroxine therapy - latest thyroid labs reviewed with pt. >> normal: Lab Results  Component Value Date   TSH 1.60 02/12/2022  - she continues on LT4 137 mcg daily - pt recently gained 15 pounds, she also feels tired and has hair thinning; we discussed that these may be signs of uncontrolled hypothyroidism - we discussed about taking the thyroid hormone every day, with water, >30 minutes before breakfast, separated by >4 hours from acid reflux medications, calcium, iron, multivitamins. Pt. is taking it correctly. - will check thyroid tests today: TSH and fT4 - target: Around the lower limit of the target range. - If labs are abnormal, she will need to return for repeat TFTs in 1.5 months  Carlus Pavlov, MD PhD [facility]
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
## 2022-05-01 NOTE — Assessment & Plan Note (Signed)
Checking lipid panel as not checked in some years.

## 2022-05-01 NOTE — Assessment & Plan Note (Signed)
Checking vitamin D and B12 and ferritin and HgA1c.

## 2022-05-02 ENCOUNTER — Other Ambulatory Visit: Payer: Self-pay | Admitting: Internal Medicine

## 2022-05-02 DIAGNOSIS — E890 Postprocedural hypothyroidism: Secondary | ICD-10-CM

## 2022-05-02 LAB — THYROGLOBULIN ANTIBODY: Thyroglobulin Ab: 7 IU/mL — ABNORMAL HIGH (ref <=1)

## 2022-05-02 LAB — THYROGLOBULIN LEVEL: Thyroglobulin: 2.6 ng/mL — ABNORMAL LOW

## 2022-05-02 MED ORDER — LEVOTHYROXINE SODIUM 150 MCG PO TABS: 150.0000 ug | ORAL_TABLET | Freq: Every day | ORAL | 3 refills | Status: DC

## 2022-05-09 DIAGNOSIS — R7303 Prediabetes: Secondary | ICD-10-CM | POA: Insufficient documentation

## 2022-05-09 MED ORDER — METFORMIN HCL ER 500 MG PO TB24: 500.0000 mg | ORAL_TABLET | Freq: Every day | ORAL | 1 refills | Status: AC

## 2022-05-15 ENCOUNTER — Ambulatory Visit
Admission: RE | Admit: 2022-05-15 | Discharge: 2022-05-15 | Disposition: A | Discharge status: Home / Self Care | Payer: 59 | Source: Ambulatory Visit | Attending: Internal Medicine | Admitting: Internal Medicine

## 2022-05-15 DIAGNOSIS — C73 Malignant neoplasm of thyroid gland: Secondary | ICD-10-CM

## 2022-05-21 ENCOUNTER — Ambulatory Visit (HOSPITAL_BASED_OUTPATIENT_CLINIC_OR_DEPARTMENT_OTHER): Payer: 59 | Attending: Internal Medicine | Admitting: Internal Medicine

## 2022-05-23 ENCOUNTER — Ambulatory Visit: Payer: 59 | Admitting: Internal Medicine

## 2022-06-12 ENCOUNTER — Encounter: Payer: Self-pay | Admitting: Family Medicine

## 2022-06-13 ENCOUNTER — Other Ambulatory Visit: Payer: Self-pay | Admitting: *Deleted

## 2022-06-13 DIAGNOSIS — Z309 Encounter for contraceptive management, unspecified: Secondary | ICD-10-CM

## 2022-06-13 MED ORDER — ETONOGESTREL-ETHINYL ESTRADIOL 0.12-0.015 MG/24HR VA RING: VAGINAL_RING | VAGINAL | 8 refills | Status: DC

## 2022-06-13 MED ORDER — ETONOGESTREL-ETHINYL ESTRADIOL 0.12-0.015 MG/24HR VA RING
VAGINAL_RING | VAGINAL | 8 refills | Status: AC
Start: 2022-06-13 — End: ?

## 2022-06-13 NOTE — Progress Notes (Signed)
Sent to wrong pharmacy.

## 2022-07-02 ENCOUNTER — Other Ambulatory Visit (INDEPENDENT_AMBULATORY_CARE_PROVIDER_SITE_OTHER): Payer: 59

## 2022-07-02 DIAGNOSIS — E890 Postprocedural hypothyroidism: Secondary | ICD-10-CM | POA: Diagnosis not present

## 2022-07-02 LAB — T4, FREE: Free T4: 0.93 ng/dL (ref 0.60–1.60)

## 2022-07-02 LAB — TSH: TSH: 1.02 u[IU]/mL (ref 0.35–5.50)

## 2022-07-04 ENCOUNTER — Other Ambulatory Visit: Payer: 59

## 2022-07-08 ENCOUNTER — Ambulatory Visit
Admission: EM | Admit: 2022-07-08 | Discharge: 2022-07-08 | Disposition: A | Discharge status: Home / Self Care | Payer: 59 | Attending: Emergency Medicine | Admitting: Emergency Medicine

## 2022-07-08 ENCOUNTER — Ambulatory Visit (INDEPENDENT_AMBULATORY_CARE_PROVIDER_SITE_OTHER): Payer: 59

## 2022-07-08 DIAGNOSIS — M542 Cervicalgia: Secondary | ICD-10-CM

## 2022-07-08 MED ORDER — METHOCARBAMOL 500 MG PO TABS: 500.0000 mg | ORAL_TABLET | Freq: Two times a day (BID) | ORAL | 0 refills | Status: AC | PRN

## 2022-07-08 NOTE — Discharge Instructions (Addendum)
Take ibuprofen as needed for discomfort.  Take the muscle relaxer as needed for muscle spasm; Do not drive, operate machinery, or drink alcohol with this medication as it can cause drowsiness.   Follow up with your primary care provider or an orthopedist if your symptoms are not improving.     

## 2022-07-08 NOTE — ED Triage Notes (Signed)
Patient to Urgent Care with complaints of upper back pain that started four days ago.   Pain worse when moving neck/ shrugging shoulders. Reports pain started at the gym, states she lifted a weight and felt a pop. Describes pain as sharp and aching.

## 2022-07-08 NOTE — ED Provider Notes (Signed)
[facility]    CSN: [id number] Arrival date & time: 07/08/22  0900      History   Chief Complaint Chief Complaint  Patient presents with   Back Pain    HPI Jessica Mcpherson is a 36 y.o. female.  Patient presents with neck pain x 4 days.  She was lifting weights at the gym; she felt and heard a pop in her neck.  She has had pain since then.  The pain does not radiate.  No numbness, weakness, paresthesias, bruising, redness, wounds, chest pain, shortness of breath, or other symptoms.  No OTC medications taken today.  Her medical history includes hypertension, hypothyroidism, prediabetes, arthritis, GERD, anxiety, depression.  The history is provided by the patient and medical records.    Past Medical History:  Diagnosis Date   Anxiety    Arthritis    Right knee   Depression    GERD (gastroesophageal reflux disease)    Headache    Hypertension    Miscarriage    x2   Papillary thyroid carcinoma [facility])     Patient Active Problem List   Diagnosis Date Noted   Pre-diabetes 05/09/2022   Snoring 05/01/2022   Encounter for IUD insertion 04/03/2022   Travel advice encounter 02/12/2022   Encounter for general adult medical examination with abnormal findings 01/02/2022   Abdominal pain 10/28/2021   Urinary frequency 10/28/2021   Syncope 06/13/2021   Insomnia 11/07/2020   Gonorrhea 12/26/2019   Papanicolaou smear of cervix with positive high risk human papilloma virus (HPV) test 12/26/2019   Other fatigue 06/18/2019   Papillary thyroid carcinoma (HCC) 05/03/2019   Essential hypertension 05/03/2019   Tobacco abuse 05/03/2019   Depression 05/03/2019   Right rotator cuff tendonitis 01/19/2019   Synovitis of right shoulder 01/19/2019   GAD (generalized anxiety disorder) 08/27/2017    Past Surgical History:  Procedure Laterality Date   CESAREAN SECTION     LAPAROSCOPIC APPENDECTOMY N/A 12/30/2013   Procedure: APPENDECTOMY LAPAROSCOPIC;  Surgeon: Avel Peace, MD;   Location: [facility];  Service: General;  Laterality: N/A;   THYROIDECTOMY N/A 07/22/2019   Procedure: TOTAL THYROIDECTOMY WITH CENTRAL COMPARTMENT LYMPH NODE DISSECTION ZONE VI;  Surgeon: Darnell Level, MD;  Location: [facility];  Service: General;  Laterality: N/A;    OB History     Gravida  5   Para  1   Term  1   Preterm      AB  2   Living  1      SAB  1   IAB      Ectopic      Multiple      Live Births  1            Home Medications    Prior to Admission medications   Medication Sig Start Date End Date Taking? Authorizing Provider  methocarbamol (ROBAXIN) 500 MG tablet Take 1 tablet (500 mg total) by mouth 2 (two) times daily as needed for muscle spasms. 07/08/22  Yes Mickie Bail, NP  acetaminophen (TYLENOL) 500 MG tablet Take 1,000 mg by mouth every 6 (six) hours as needed for moderate pain.    [provider]  ALPRAZolam Prudy Feeler) 0.25 MG tablet Take 1 tablet (0.25 mg total) by mouth 2 (two) times daily as needed (panic attacks). 01/01/22   Myrlene Broker, MD  benzonatate (TESSALON) 100 MG capsule Take 1-2 tablets 3 times a day as needed for cough Patient not taking: Reported on 07/08/2022  03/05/22   Immordino, Jeannett Senior, FNP  buPROPion (WELLBUTRIN XL) 150 MG 24 hr tablet Take 1 tablet (150 mg total) by mouth daily. Patient not taking: Reported on 07/08/2022 01/01/22   Myrlene Broker, MD  cyclobenzaprine (FLEXERIL) 10 MG tablet Take 1 tablet (10 mg total) by mouth at bedtime. 04/22/22   Immordino, Jeannett Senior, FNP  etonogestrel-ethinyl estradiol (NUVARING) 0.12-0.015 MG/24HR vaginal ring Insert vaginally and leave in place for 3 consecutive weeks, then remove for 1 week. 06/13/22   Calvert Cantor, CNM  levothyroxine (SYNTHROID) 150 MCG tablet Take 1 tablet (150 mcg total) by mouth daily before breakfast. 05/02/22   Carlus Pavlov, MD  metFORMIN (GLUCOPHAGE-XR) 500 MG 24 hr tablet Take 1 tablet (500 mg total) by mouth daily with breakfast. Patient  not taking: Reported on 07/08/2022 05/09/22   Myrlene Broker, MD  Multiple Vitamin (MULTIVITAMIN WITH MINERALS) TABS tablet Take 1 tablet by mouth daily.    [provider]  progesterone (PROMETRIUM) 200 MG capsule Place one capsule vaginally at bedtime Patient not taking: Reported on 07/08/2022 03/11/22   Anyanwu, Jethro Bastos, MD  scopolamine (TRANSDERM SCOP, 1.5 MG,) 1 MG/3DAYS Place 1 patch (1.5 mg total) onto the skin every 3 (three) days. Patient not taking: Reported on 07/08/2022 02/12/22   Myrlene Broker, MD  traZODone (DESYREL) 50 MG tablet Take 100 mg by mouth at bedtime as needed for sleep. Patient not taking: Reported on 07/08/2022    [provider]  Vitamin D, Ergocalciferol, (DRISDOL) 1.25 MG (50000 UNIT) CAPS capsule Take 1 capsule (50,000 Units total) by mouth every 7 (seven) days. 06/13/21   Myrlene Broker, MD    Family History Family History  Problem Relation Age of Onset   Diabetes Mother    Hypertension Mother    Asthma Mother    Diabetes Maternal Grandmother    Hypertension Maternal Grandmother    Arthritis Maternal Grandmother    Heart disease Maternal Grandmother    Diabetes Maternal Grandfather    Arthritis Maternal Grandfather    Heart disease Maternal Grandfather    Hypertension Father     Social History Social History   Tobacco Use   Smoking status: Every Day    Packs/day: .1    Types: Cigarettes   Smokeless tobacco: Never  Vaping Use   Vaping Use: Never used  Substance Use Topics   Alcohol use: No    Alcohol/week: 0.0 standard drinks of alcohol   Drug use: No     Allergies   Penicillins and Pepperoni [pickled meat]   Review of Systems Review of Systems  Constitutional:  Negative for chills and fever.  Respiratory:  Negative for cough and shortness of breath.   Cardiovascular:  Negative for chest pain and palpitations.  Musculoskeletal:  Positive for back pain and neck pain. Negative for arthralgias, gait  problem and joint swelling.  Skin:  Negative for color change, rash and wound.  Neurological:  Negative for weakness and numbness.     Physical Exam Triage Vital Signs ED Triage Vitals  Enc Vitals Group     BP 07/08/22 0931 116/85     Pulse Rate 07/08/22 0925 91     Resp 07/08/22 0925 18     Temp 07/08/22 0925 98.5 F (36.9 C)     Temp src --      SpO2 07/08/22 0925 98 %     Weight --      Height --      Head Circumference --  Peak Flow --      Pain Score 07/08/22 0928 8     Pain Loc --      Pain Edu? --      Excl. in GC? --    No data found.  Updated Vital Signs BP 116/85   Pulse 91   Temp 98.5 F (36.9 C)   Resp 18   LMP 06/11/2022   SpO2 98%   Visual Acuity Right Eye Distance:   Left Eye Distance:   Bilateral Distance:    Right Eye Near:   Left Eye Near:    Bilateral Near:     Physical Exam Vitals and nursing note reviewed.  Constitutional:      General: She is not in acute distress.    Appearance: She is well-developed.  HENT:     Mouth/Throat:     Mouth: Mucous membranes are moist.  Cardiovascular:     Rate and Rhythm: Normal rate and regular rhythm.     Heart sounds: Normal heart sounds.  Pulmonary:     Effort: Pulmonary effort is normal. No respiratory distress.     Breath sounds: Normal breath sounds.  Musculoskeletal:        General: Tenderness present. No swelling or deformity. Normal range of motion.     Cervical back: Neck supple.     Comments: Mild tenderness of lower cervical spine.  No point tenderness.  Upper extremity strength 5/5, sensation intact.  FROM in neck and upper extremities.  No wounds, ecchymosis, erythema, edema.    Skin:    General: Skin is warm and dry.     Capillary Refill: Capillary refill takes less than 2 seconds.     Findings: No bruising, erythema, lesion or rash.  Neurological:     General: No focal deficit present.     Mental Status: She is alert and oriented to person, place, and time.     Sensory: No  sensory deficit.     Motor: No weakness.     Gait: Gait normal.  Psychiatric:        Mood and Affect: Mood normal.        Behavior: Behavior normal.      UC Treatments / Results  Labs (all labs ordered are listed, but only abnormal results are displayed) Labs Reviewed - No data to display  EKG   Radiology DG Cervical Spine Complete  Result Date: 07/08/2022 CLINICAL DATA:  Pain for 4 days after lifting weights. EXAM: CERVICAL SPINE - COMPLETE 7 VIEW COMPARISON:  None Available. FINDINGS: C1 through C7 is seen in the lateral view. Preserved vertebral body height, disc height and prevertebral soft tissues. There is some loss of normal cervical lordosis on lateral view which can be related to patient position or muscle spasm. Preserved bone mineralization. No osseous neural foraminal stenosis seen on the oblique views. Preserved appearance of the dens on the open-mouth view. Note is made of several surgical clips in the low central neck. If there is further concern of injury, additional workup may be useful with cross-sectional imaging for further sensitivity such as CT or MRI. IMPRESSION: Loss of normal cervical lordosis. Electronically Signed   By: Karen Kays M.D.   On: 07/08/2022 10:09    Procedures Procedures (including critical care time)  Medications Ordered in UC Medications - No data to display  Initial Impression / Assessment and Plan / UC Course  I have reviewed the triage vital signs and the nursing notes.  Pertinent labs & imaging  results that were available during my care of the patient were reviewed by me and considered in my medical decision making (see chart for details).    Neck pain.  Xray does not show any acute abnormality.  Treating with methocarbamol; precautions for drowsiness with this medication discussed.  OTC Tylenol or ibuprofen as needed.  Instructed patient to follow-up with her PCP or an orthopedist if her symptoms or not improving.  Contact information  for on-call Ortho provided.  Education provided on musculoskeletal pain.  Work note provided per patient request.  She agrees to plan of care.  Final Clinical Impressions(s) / UC Diagnoses   Final diagnoses:  Neck pain     Discharge Instructions      Take ibuprofen as needed for discomfort.    Take the muscle relaxer as needed for muscle spasm; Do not drive, operate machinery, or drink alcohol with this medication as it can cause drowsiness.   Follow up with your primary care provider or an orthopedist if your symptoms are not improving.         ED Prescriptions     Medication Sig Dispense Auth. Provider   methocarbamol (ROBAXIN) 500 MG tablet Take 1 tablet (500 mg total) by mouth 2 (two) times daily as needed for muscle spasms. 10 tablet Mickie Bail, NP      I have reviewed the PDMP during this encounter.   Mickie Bail, NP 07/08/22 [phone]

## 2022-07-11 ENCOUNTER — Other Ambulatory Visit: Payer: Self-pay

## 2022-07-11 ENCOUNTER — Emergency Department
Admission: EM | Admit: 2022-07-11 | Discharge: 2022-07-11 | Disposition: A | Discharge status: Home / Self Care | Payer: 59 | Attending: Emergency Medicine | Admitting: Emergency Medicine

## 2022-07-11 DIAGNOSIS — S70361A Insect bite (nonvenomous), right thigh, initial encounter: Secondary | ICD-10-CM | POA: Insufficient documentation

## 2022-07-11 DIAGNOSIS — I10 Essential (primary) hypertension: Secondary | ICD-10-CM | POA: Insufficient documentation

## 2022-07-11 DIAGNOSIS — W57XXXA Bitten or stung by nonvenomous insect and other nonvenomous arthropods, initial encounter: Secondary | ICD-10-CM | POA: Insufficient documentation

## 2022-07-11 MED ORDER — CETIRIZINE HCL 10 MG PO TABS: 10.0000 mg | ORAL_TABLET | Freq: Every day | ORAL | 2 refills | Status: AC | PRN

## 2022-07-11 MED ORDER — EPINEPHRINE 0.3 MG/0.3ML IJ SOAJ: 0.3000 mg | INTRAMUSCULAR | 0 refills | Status: AC | PRN

## 2022-07-11 NOTE — ED Triage Notes (Signed)
Pt presents to ED with c/o of bee sting to R upper thigh. Pt states she has been stung as a child with no issues.

## 2022-07-11 NOTE — ED Provider Notes (Signed)
[facility] Provider Note    Event Date/Time   First MD Initiated Contact with Patient 07/11/22 1311     (approximate)   History   Insect Bite   HPI  Jessica Mcpherson is a 37 y.o. 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  The sting was to her right thigh.  This happened approximately 1 to 2 hours ago.  She has some localized pain and swelling but no systemic symptoms like shortness of breath, wheezing, abdominal pain, nausea, hives or itching.  She has no acute medical plaints otherwise.   External Medical Documents Reviewed: [facility] healthcare clinical summary for past medical history medications and allergies, which lists penicillin allergy      Physical Exam   Triage Vital Signs: ED Triage Vitals  Enc Vitals Group     BP 07/11/22 1240 (!) 141/98     Pulse Rate 07/11/22 1240 97     Resp 07/11/22 1240 18     Temp 07/11/22 1240 98.5 F (36.9 C)     Temp Source 07/11/22 1240 Oral     SpO2 07/11/22 1240 98 %     Weight 07/11/22 1307 181 lb (82.1 kg)     Height 07/11/22 1307 5' (1.524 m)     Head Circumference --      Peak Flow --      Pain Score 07/11/22 1240 8     Pain Loc --      Pain Edu? --      Excl. in GC? --     Most recent vital signs: Vitals:   07/11/22 1240  BP: (!) 141/98  Pulse: 97  Resp: 18  Temp: 98.5 F (36.9 C)  SpO2: 98%    General: Awake, no distress.  CV:  Good peripheral perfusion.  Resp:  Normal effort.  Abd:  No distention.  Other:  No significant erythema or swelling to the site of insect bite to the right upper anterior thigh.  Lungs clear abdomen soft and nontender no hives comfortable hypertensive otherwise normal vital signs.   ED Results / Procedures / Treatments   Labs (all labs ordered are  listed, but only abnormal results are displayed) Labs Reviewed - No data to display    PROCEDURES:  Critical Care performed: No  Procedures   MEDICATIONS ORDERED IN ED: Medications - No data to display   IMPRESSION / MDM / ASSESSMENT AND PLAN / ED COURSE  I reviewed the triage vital signs and the nursing notes.                                Patient's presentation is most consistent with acute presentation with potential threat to life or bodily function.  Differential diagnosis includes, but is not limited to, allergic reaction, insect bite, anaphylaxis   MDM:   This patient with an insect bite with only localized symptoms, very mild.  No signs of anaphylaxis.  No itching.  I prescribed her antihistamine to use as needed.  I prescribed her EpiPen given her history of anaphylactic reaction to insect bite as a child.  She looks well.  Plan is for discharge close PMD follow-up         FINAL CLINICAL  IMPRESSION(S) / ED DIAGNOSES   Final diagnoses:  Insect bite of right thigh, initial encounter     Rx / DC Orders   ED Discharge Orders          Ordered    EPINEPHrine 0.3 mg/0.3 mL IJ SOAJ injection  As needed        07/11/22 1347    cetirizine (ZYRTEC ALLERGY) 10 MG tablet  Daily PRN        07/11/22 1347             Note:  This document was prepared using Dragon voice recognition software and may include unintentional dictation errors.    Pilar Jarvis, MD 07/11/22 [phone]

## 2022-07-14 ENCOUNTER — Ambulatory Visit: Payer: 59 | Admitting: Dietician

## 2022-07-16 NOTE — Progress Notes (Signed)
Subjective:    Patient ID: Jessica Mcpherson, female    DOB: Oct 28, 1985, 37 y.o.   MRN: [id number]      HPI Jessica Mcpherson is here for  Chief Complaint  Patient presents with   Medical Management of Chronic Issues    Patient would like to have A1C rechecked to see if she is still pre-diabetic before starting her Metformin that Dr. Okey Dupre prescribed    Sugar concerns - she has not started the metformin.  She has changed her diet and is exercising.  She has lost some weight.  She wanted to avoid metformin if possible    Lab Results  Component Value Date   HGBA1C 5.9 05/01/2022     Medications and allergies reviewed with patient and updated if appropriate.  Current Outpatient Medications on File Prior to Visit  Medication Sig Dispense Refill   acetaminophen (TYLENOL) 500 MG tablet Take 1,000 mg by mouth every 6 (six) hours as needed for moderate pain.     ALPRAZolam (XANAX) 0.25 MG tablet Take 1 tablet (0.25 mg total) by mouth 2 (two) times daily as needed (panic attacks). 20 tablet 0   cetirizine (ZYRTEC ALLERGY) 10 MG tablet Take 1 tablet (10 mg total) by mouth daily as needed for allergies. 30 tablet 2   EPINEPHrine 0.3 mg/0.3 mL IJ SOAJ injection Inject 0.3 mg into the muscle as needed for anaphylaxis. 1 each 0   etonogestrel-ethinyl estradiol (NUVARING) 0.12-0.015 MG/24HR vaginal ring Insert vaginally and leave in place for 3 consecutive weeks, then remove for 1 week. 3 each 8   levothyroxine (SYNTHROID) 150 MCG tablet Take 1 tablet (150 mcg total) by mouth daily before breakfast. 45 tablet 3   methocarbamol (ROBAXIN) 500 MG tablet Take 1 tablet (500 mg total) by mouth 2 (two) times daily as needed for muscle spasms. 10 tablet 0   Multiple Vitamin (MULTIVITAMIN WITH MINERALS) TABS tablet Take 1 tablet by mouth daily.     progesterone (PROMETRIUM) 200 MG capsule Place one capsule vaginally at bedtime 30 capsule 3   scopolamine (TRANSDERM SCOP, 1.5 MG,) 1 MG/3DAYS Place 1 patch (1.5 mg  total) onto the skin every 3 (three) days. 3 patch 0   traZODone (DESYREL) 50 MG tablet Take 100 mg by mouth at bedtime as needed for sleep.     Vitamin D, Ergocalciferol, (DRISDOL) 1.25 MG (50000 UNIT) CAPS capsule Take 1 capsule (50,000 Units total) by mouth every 7 (seven) days. 12 capsule 3   benzonatate (TESSALON) 100 MG capsule Take 1-2 tablets 3 times a day as needed for cough (Patient not taking: Reported on 07/18/2022) 30 capsule 0   buPROPion (WELLBUTRIN XL) 150 MG 24 hr tablet Take 1 tablet (150 mg total) by mouth daily. (Patient not taking: Reported on 07/18/2022) 30 tablet 3   cyclobenzaprine (FLEXERIL) 10 MG tablet Take 1 tablet (10 mg total) by mouth at bedtime. (Patient not taking: Reported on 07/18/2022) 20 tablet 0   metFORMIN (GLUCOPHAGE-XR) 500 MG 24 hr tablet Take 1 tablet (500 mg total) by mouth daily with breakfast. (Patient not taking: Reported on 07/18/2022) 90 tablet 1   No current facility-administered medications on file prior to visit.    Review of Systems     Objective:   Vitals:   07/18/22 0953  BP: 122/78  Pulse: 85  Temp: 98.3 F (36.8 C)  SpO2: 99%   BP Readings from Last 3 Encounters:  07/18/22 122/78  07/11/22 (!) 141/98  07/08/22 116/85   Wt  Readings from Last 3 Encounters:  07/18/22 176 lb (79.8 kg)  07/11/22 181 lb (82.1 kg)  05/01/22 181 lb (82.1 kg)   Body mass index is 34.37 kg/m.    Physical Exam Constitutional:      General: She is not in acute distress.    Appearance: Normal appearance. She is not ill-appearing.  HENT:     Head: Normocephalic and atraumatic.  Skin:    General: Skin is warm and dry.  Neurological:     Mental Status: She is alert. Mental status is at baseline.  Psychiatric:        Mood and Affect: Mood normal.        Behavior: Behavior normal.        Thought Content: Thought content normal.        Judgment: Judgment normal.            Assessment & Plan:    Prediabetes: New as of 04/2022 - A1c was  5.9% Strong fam hx of diabetes Was prescribed metformin but has not started it - has been working on lifestyle changes and would like to avoid metformin Will check A1c today  5.6% Advised she does not need metformin if she can continue lifestyle changes and make them permanent lifestyle changes Continue regular exercise Continue weight loss efforts Discussed low carbs/sugar diet in detail    I spent 20 minutes dedicated to the care of this patient on the date of this encounter including review of recent labs, obtaining history, communicating with the patient - specifically discussing improving sugars with life style changes including exercise, weight loss and decreasing carbs and sugars-reviewed in detail which foods to avoid,  tests, and documenting clinical information in the EHR

## 2022-07-18 ENCOUNTER — Ambulatory Visit: Payer: 59 | Admitting: Internal Medicine

## 2022-07-18 ENCOUNTER — Encounter: Payer: Self-pay | Admitting: Internal Medicine

## 2022-07-18 VITALS — BP 122/78 | HR 85 | Temp 98.3°F | Ht 60.0 in | Wt 176.0 lb

## 2022-07-18 DIAGNOSIS — R7303 Prediabetes: Secondary | ICD-10-CM | POA: Diagnosis not present

## 2022-07-18 LAB — POCT GLYCOSYLATED HEMOGLOBIN (HGB A1C)
HbA1c POC (<> result, manual entry): 5.6 % (ref 4.0–5.6)
HbA1c, POC (controlled diabetic range): 5.6 % (ref 0.0–7.0)
HbA1c, POC (prediabetic range): 5.6 % — AB (ref 5.7–6.4)
Hemoglobin A1C: 5.6 % — AB (ref 4.0–5.6)

## 2022-07-18 NOTE — Patient Instructions (Addendum)
      Your A1c was checked today.     Medications changes include :   none     

## 2022-07-19 ENCOUNTER — Other Ambulatory Visit: Payer: Self-pay | Admitting: Internal Medicine

## 2022-07-22 ENCOUNTER — Encounter: Payer: Self-pay | Admitting: Internal Medicine

## 2022-07-22 ENCOUNTER — Telehealth: Payer: Self-pay | Admitting: Internal Medicine

## 2022-07-22 NOTE — Telephone Encounter (Signed)
Prescription Request  07/22/2022  LOV: 05/01/2022  What is the name of the medication or equipment? Vitamin D, Ergocalciferol, (DRISDOL) 1.25 MG (50000 UNIT) CAPS capsule   Have you contacted your pharmacy to request a refill? No   Which pharmacy would you like this sent to?  [facility] [address] [place] - [address] [place] [address] Phone: [phone] Fax: [phone]    Patient notified that their request is being sent to the clinical staff for review and that they should receive a response within 2 business days.   Please advise at Mobile [phone] (mobile)

## 2022-07-23 ENCOUNTER — Other Ambulatory Visit: Payer: Self-pay

## 2022-07-23 NOTE — Telephone Encounter (Signed)
Please advise if this ok to send in for patient. Typically Dr Okey Dupre approves vitamin d refills.

## 2022-08-10 ENCOUNTER — Encounter: Payer: Self-pay | Admitting: Internal Medicine

## 2022-08-10 NOTE — Patient Instructions (Incomplete)
      Medications changes include :   fluoxetine 20 mg daily    A referral was ordered for therapy at behavioral health and someone will call you to schedule an appointment.    Note given for work   Return for follow up with Dr Okey Dupre as scheduled.

## 2022-08-10 NOTE — Progress Notes (Unsigned)
    Subjective:    Patient ID: Jessica Mcpherson, female    DOB: 1985/12/13, 37 y.o.   MRN: [id number]      HPI Jessica Mcpherson is here for No chief complaint on file.    Panic attacks -     Medications and allergies reviewed with patient and updated if appropriate.  Current Outpatient Medications on File Prior to Visit  Medication Sig Dispense Refill   acetaminophen (TYLENOL) 500 MG tablet Take 1,000 mg by mouth every 6 (six) hours as needed for moderate pain.     ALPRAZolam (XANAX) 0.25 MG tablet Take 1 tablet (0.25 mg total) by mouth 2 (two) times daily as needed (panic attacks). 20 tablet 0   benzonatate (TESSALON) 100 MG capsule Take 1-2 tablets 3 times a day as needed for cough (Patient not taking: Reported on 07/18/2022) 30 capsule 0   buPROPion (WELLBUTRIN XL) 150 MG 24 hr tablet Take 1 tablet (150 mg total) by mouth daily. (Patient not taking: Reported on 07/18/2022) 30 tablet 3   cetirizine (ZYRTEC ALLERGY) 10 MG tablet Take 1 tablet (10 mg total) by mouth daily as needed for allergies. 30 tablet 2   cyclobenzaprine (FLEXERIL) 10 MG tablet Take 1 tablet (10 mg total) by mouth at bedtime. (Patient not taking: Reported on 07/18/2022) 20 tablet 0   EPINEPHrine 0.3 mg/0.3 mL IJ SOAJ injection Inject 0.3 mg into the muscle as needed for anaphylaxis. 1 each 0   etonogestrel-ethinyl estradiol (NUVARING) 0.12-0.015 MG/24HR vaginal ring Insert vaginally and leave in place for 3 consecutive weeks, then remove for 1 week. 3 each 8   levothyroxine (SYNTHROID) 150 MCG tablet Take 1 tablet (150 mcg total) by mouth daily before breakfast. 45 tablet 3   metFORMIN (GLUCOPHAGE-XR) 500 MG 24 hr tablet Take 1 tablet (500 mg total) by mouth daily with breakfast. (Patient not taking: Reported on 07/18/2022) 90 tablet 1   methocarbamol (ROBAXIN) 500 MG tablet Take 1 tablet (500 mg total) by mouth 2 (two) times daily as needed for muscle spasms. 10 tablet 0   Multiple Vitamin (MULTIVITAMIN WITH MINERALS) TABS tablet  Take 1 tablet by mouth daily.     progesterone (PROMETRIUM) 200 MG capsule Place one capsule vaginally at bedtime 30 capsule 3   scopolamine (TRANSDERM SCOP, 1.5 MG,) 1 MG/3DAYS Place 1 patch (1.5 mg total) onto the skin every 3 (three) days. 3 patch 0   traZODone (DESYREL) 50 MG tablet Take 100 mg by mouth at bedtime as needed for sleep.     Vitamin D, Ergocalciferol, (DRISDOL) 1.25 MG (50000 UNIT) CAPS capsule Take 1 capsule (50,000 Units total) by mouth every 7 (seven) days. 12 capsule 3   No current facility-administered medications on file prior to visit.    Review of Systems     Objective:  There were no vitals filed for this visit. BP Readings from Last 3 Encounters:  07/18/22 122/78  07/11/22 (!) 141/98  07/08/22 116/85   Wt Readings from Last 3 Encounters:  07/18/22 176 lb (79.8 kg)  07/11/22 181 lb (82.1 kg)  05/01/22 181 lb (82.1 kg)   There is no height or weight on file to calculate BMI.    Physical Exam         Assessment & Plan:    See Problem List for Assessment and Plan of chronic medical problems.

## 2022-08-11 ENCOUNTER — Ambulatory Visit: Payer: 59 | Admitting: Internal Medicine

## 2022-08-11 VITALS — BP 130/72 | HR 90 | Temp 98.5°F | Ht 60.0 in | Wt 174.0 lb

## 2022-08-11 DIAGNOSIS — F410 Panic disorder [episodic paroxysmal anxiety] without agoraphobia: Secondary | ICD-10-CM

## 2022-08-11 DIAGNOSIS — F411 Generalized anxiety disorder: Secondary | ICD-10-CM

## 2022-08-11 MED ORDER — FLUOXETINE HCL 20 MG PO CAPS: 20.0000 mg | ORAL_CAPSULE | Freq: Every day | ORAL | 5 refills | Status: AC

## 2022-08-11 NOTE — Assessment & Plan Note (Signed)
Chronic Was controlled for a while w/o medication ( took xanax prn, never took wellbutrin) Increased stress recently and having panic attacks x 2 weeks Last day of work 7/14 Discussed treatment options Start fluoxetine 20 mg daily Referral for therapy ordered [facility] note provided 7/15 - 7/30 -- to RTW 7/31 To f/u with PCP next week  - may need to start FMLA

## 2022-08-13 ENCOUNTER — Encounter: Payer: Self-pay | Admitting: Internal Medicine

## 2022-08-18 ENCOUNTER — Other Ambulatory Visit: Payer: Self-pay | Admitting: Internal Medicine

## 2022-08-18 ENCOUNTER — Encounter: Payer: Self-pay | Admitting: Internal Medicine

## 2022-08-20 ENCOUNTER — Other Ambulatory Visit: Payer: Self-pay | Admitting: Internal Medicine

## 2022-08-21 ENCOUNTER — Ambulatory Visit: Payer: 59 | Admitting: Internal Medicine

## 2022-08-24 NOTE — Progress Notes (Deleted)
    Subjective:    Patient ID: Jessica Mcpherson, female    DOB: 03/25/1985, 37 y.o.   MRN: [id number]      HPI Jessica Mcpherson is here for No chief complaint on file.   First saw her for anxiety, panic - 08/11/22  H/o panic attack x few years.  With panic attacks experiences nausea, lightheadedness/dizziness, palpitations and she hyperventilates.   Out of work since  7/15  Taking xanax 0.25 mg bid prn, fluoxetine 20 mg daily  Referral ordered for therapy 08/11/22.  Her first appt is 09/18/22    Medications and allergies reviewed with patient and updated if appropriate.  Current Outpatient Medications on File Prior to Visit  Medication Sig Dispense Refill   acetaminophen (TYLENOL) 500 MG tablet Take 1,000 mg by mouth every 6 (six) hours as needed for moderate pain.     ALPRAZolam (XANAX) 0.25 MG tablet Take 1 tablet by mouth twice daily as needed 10 tablet 0   cetirizine (ZYRTEC ALLERGY) 10 MG tablet Take 1 tablet (10 mg total) by mouth daily as needed for allergies. 30 tablet 2   cyclobenzaprine (FLEXERIL) 10 MG tablet Take 1 tablet (10 mg total) by mouth at bedtime. (Patient not taking: Reported on 07/18/2022) 20 tablet 0   EPINEPHrine 0.3 mg/0.3 mL IJ SOAJ injection Inject 0.3 mg into the muscle as needed for anaphylaxis. 1 each 0   etonogestrel-ethinyl estradiol (NUVARING) 0.12-0.015 MG/24HR vaginal ring Insert vaginally and leave in place for 3 consecutive weeks, then remove for 1 week. 3 each 8   FLUoxetine (PROZAC) 20 MG capsule Take 1 capsule (20 mg total) by mouth daily. 30 capsule 5   levothyroxine (SYNTHROID) 150 MCG tablet Take 1 tablet (150 mcg total) by mouth daily before breakfast. 45 tablet 3   metFORMIN (GLUCOPHAGE-XR) 500 MG 24 hr tablet Take 1 tablet (500 mg total) by mouth daily with breakfast. (Patient not taking: Reported on 08/11/2022) 90 tablet 1   methocarbamol (ROBAXIN) 500 MG tablet Take 1 tablet (500 mg total) by mouth 2 (two) times daily as needed for muscle spasms. 10  tablet 0   Multiple Vitamin (MULTIVITAMIN WITH MINERALS) TABS tablet Take 1 tablet by mouth daily.     progesterone (PROMETRIUM) 200 MG capsule Place one capsule vaginally at bedtime 30 capsule 3   scopolamine (TRANSDERM SCOP, 1.5 MG,) 1 MG/3DAYS Place 1 patch (1.5 mg total) onto the skin every 3 (three) days. 3 patch 0   traZODone (DESYREL) 50 MG tablet Take 100 mg by mouth at bedtime as needed for sleep. (Patient not taking: Reported on 08/11/2022)     Vitamin D, Ergocalciferol, (DRISDOL) 1.25 MG (50000 UNIT) CAPS capsule Take 1 capsule (50,000 Units total) by mouth every 7 (seven) days. 12 capsule 3   No current facility-administered medications on file prior to visit.    Review of Systems     Objective:  There were no vitals filed for this visit. BP Readings from Last 3 Encounters:  08/11/22 130/72  07/18/22 122/78  07/11/22 (!) 141/98   Wt Readings from Last 3 Encounters:  08/11/22 174 lb (78.9 kg)  07/18/22 176 lb (79.8 kg)  07/11/22 181 lb (82.1 kg)   There is no height or weight on file to calculate BMI.    Physical Exam         Assessment & Plan:    See Problem List for Assessment and Plan of chronic medical problems.

## 2022-08-25 ENCOUNTER — Encounter: Payer: Self-pay | Admitting: Internal Medicine

## 2022-08-25 ENCOUNTER — Ambulatory Visit: Payer: 59 | Admitting: Internal Medicine

## 2022-08-26 ENCOUNTER — Encounter: Payer: Self-pay | Admitting: Internal Medicine

## 2022-08-27 ENCOUNTER — Other Ambulatory Visit: Payer: Self-pay

## 2022-08-27 NOTE — Telephone Encounter (Signed)
Pharmacy has been updated in patient chart

## 2022-08-28 MED ORDER — ALPRAZOLAM 0.25 MG PO TABS: 0.2500 mg | ORAL_TABLET | Freq: Two times a day (BID) | ORAL | 0 refills | Status: DC | PRN

## 2022-08-28 NOTE — Telephone Encounter (Signed)
Please call and cancel alprazolam at [facility] pharmacy

## 2022-09-02 ENCOUNTER — Encounter: Payer: Self-pay | Admitting: Internal Medicine

## 2022-09-02 DIAGNOSIS — R5383 Other fatigue: Secondary | ICD-10-CM

## 2022-09-02 DIAGNOSIS — E890 Postprocedural hypothyroidism: Secondary | ICD-10-CM

## 2022-09-03 ENCOUNTER — Other Ambulatory Visit (INDEPENDENT_AMBULATORY_CARE_PROVIDER_SITE_OTHER): Payer: 59

## 2022-09-03 DIAGNOSIS — R5383 Other fatigue: Secondary | ICD-10-CM

## 2022-09-03 DIAGNOSIS — E890 Postprocedural hypothyroidism: Secondary | ICD-10-CM

## 2022-09-03 LAB — COMPREHENSIVE METABOLIC PANEL
ALT: 19 U/L (ref 0–35)
AST: 19 U/L (ref 0–37)
Albumin: 4.1 g/dL (ref 3.5–5.2)
Alkaline Phosphatase: 58 U/L (ref 39–117)
BUN: 12 mg/dL (ref 6–23)
CO2: 25 meq/L (ref 19–32)
Calcium: 9.4 mg/dL (ref 8.4–10.5)
Chloride: 104 meq/L (ref 96–112)
Creatinine, Ser: 0.76 mg/dL (ref 0.40–1.20)
GFR: 101.08 mL/min (ref >60.00)
Glucose, Bld: 92 mg/dL (ref 70–99)
Potassium: 4.1 meq/L (ref 3.5–5.1)
Sodium: 137 meq/L (ref 135–145)
Total Bilirubin: 0.3 mg/dL (ref 0.2–1.2)
Total Protein: 7.8 g/dL (ref 6.0–8.3)

## 2022-09-03 LAB — CBC
HCT: 41.8 % (ref 36.0–46.0)
Hemoglobin: 13.7 g/dL (ref 12.0–15.0)
MCHC: 32.8 g/dL (ref 30.0–36.0)
MCV: 88.8 fl (ref 78.0–100.0)
Platelets: 342 10*3/uL (ref 150.0–400.0)
RBC: 4.71 Mil/uL (ref 3.87–5.11)
RDW: 13.4 % (ref 11.5–15.5)
WBC: 7.2 10*3/uL (ref 4.0–10.5)

## 2022-09-05 LAB — TSH: TSH: 1.22 u[IU]/mL (ref 0.35–5.50)

## 2022-09-05 LAB — T4, FREE: Free T4: 1.24 ng/dL (ref 0.60–1.60)

## 2022-09-05 LAB — VITAMIN D 25 HYDROXY (VIT D DEFICIENCY, FRACTURES): VITD: 42.76 ng/mL (ref 30.00–100.00)

## 2022-09-08 ENCOUNTER — Encounter: Payer: Self-pay | Admitting: Internal Medicine

## 2022-09-08 DIAGNOSIS — E890 Postprocedural hypothyroidism: Secondary | ICD-10-CM

## 2022-09-09 MED ORDER — LEVOTHYROXINE SODIUM 150 MCG PO TABS: 150.0000 ug | ORAL_TABLET | Freq: Every day | ORAL | 3 refills | Status: DC

## 2022-09-10 ENCOUNTER — Encounter: Payer: Self-pay | Admitting: Internal Medicine

## 2022-09-11 ENCOUNTER — Ambulatory Visit: Payer: 59 | Admitting: Internal Medicine

## 2022-09-18 ENCOUNTER — Ambulatory Visit: Payer: 59 | Admitting: Clinical

## 2022-10-14 ENCOUNTER — Ambulatory Visit: Payer: 59 | Admitting: Clinical

## 2022-12-04 ENCOUNTER — Other Ambulatory Visit: Payer: Self-pay

## 2022-12-04 ENCOUNTER — Emergency Department
Admission: EM | Admit: 2022-12-04 | Discharge: 2022-12-04 | Disposition: A | Discharge status: Home / Self Care | Payer: 59 | Attending: Emergency Medicine | Admitting: Emergency Medicine

## 2022-12-04 DIAGNOSIS — N898 Other specified noninflammatory disorders of vagina: Secondary | ICD-10-CM | POA: Insufficient documentation

## 2022-12-04 DIAGNOSIS — I10 Essential (primary) hypertension: Secondary | ICD-10-CM | POA: Diagnosis not present

## 2022-12-04 LAB — WET PREP, GENITAL
Clue Cells Wet Prep HPF POC: NONE SEEN
Sperm: NONE SEEN
Trich, Wet Prep: NONE SEEN
WBC, Wet Prep HPF POC: <10 (ref <10)
Yeast Wet Prep HPF POC: NONE SEEN

## 2022-12-04 LAB — URINALYSIS, ROUTINE W REFLEX MICROSCOPIC
Bilirubin Urine: NEGATIVE
Glucose, UA: NEGATIVE mg/dL
Ketones, ur: NEGATIVE mg/dL
Leukocytes,Ua: NEGATIVE
Nitrite: NEGATIVE
Protein, ur: NEGATIVE mg/dL
RBC / HPF: 0 RBC/hpf (ref 0–5)
Specific Gravity, Urine: 1.02 (ref 1.005–1.030)
pH: 6 (ref 5.0–8.0)

## 2022-12-04 LAB — CHLAMYDIA/NGC RT PCR (ARMC ONLY)
Chlamydia Tr: NOT DETECTED
N gonorrhoeae: NOT DETECTED

## 2022-12-04 LAB — POC URINE PREG, ED: Preg Test, Ur: NEGATIVE

## 2022-12-04 NOTE — ED Triage Notes (Signed)
Pt to ED for vaginal discomfort/burning with intercourse. Denies burning with urination

## 2022-12-04 NOTE — Discharge Instructions (Addendum)
Your exam and lab are normal at this time. Follow-up with your primary provider for ongoing evaluation.  Follow [facility] MyChart for your remaining results.

## 2022-12-04 NOTE — ED Notes (Signed)
See triage notes. Patient c/o vaginal discomfort/burning during intercourse. Denies burning with urination.

## 2022-12-04 NOTE — ED Provider Notes (Signed)
[facility] Emergency Department Provider Note     Event Date/Time   First MD Initiated Contact with Patient 12/04/22 1846     (approximate)   History   No chief complaint on file.   HPI  Jessica Mcpherson is a 37 y.o. female with a history of HTN, GERD, anxiety, depression, presents to the ED for evaluation of vaginal irritation.  She describes discomfort and burning associated with intercourse.  She denies any dysuria, hematuria, vaginal discharge, or abnormal vaginal bleeding.  Patient reports normal LMP and daily use of oral contraceptives.  She apparently presented to her PCP at [facility], earlier this week for the same complaint, and has a pending wet prep swab which she has not received reports at this time about.  Physical Exam   Triage Vital Signs: ED Triage Vitals  Encounter Vitals Group     BP 12/04/22 1832 (!) 150/105     Systolic BP Percentile --      Diastolic BP Percentile --      Pulse Rate 12/04/22 1832 (!) 104     Resp 12/04/22 1832 16     Temp 12/04/22 1832 98.8 F (37.1 C)     Temp src --      SpO2 12/04/22 1832 99 %     Weight 12/04/22 1830 180 lb (81.6 kg)     Height 12/04/22 1830 4\' 11"  (1.499 m)     Head Circumference --      Peak Flow --      Pain Score 12/04/22 1830 0     Pain Loc --      Pain Education --      Exclude from Growth Chart --     Most recent vital signs: Vitals:   12/04/22 1832  BP: (!) 150/105  Pulse: (!) 104  Resp: 16  Temp: 98.8 F (37.1 C)  SpO2: 99%    General Awake, no distress. NAD HEENT NCAT. PERRL. EOMI. No rhinorrhea. Mucous membranes are moist.  CV:  Good peripheral perfusion. RRR RESP:  Normal effort. CTA ABD:  No distention.  GU:  Patient declined pelvic exam.  Patient-collected swabs sent to lab   ED Results / Procedures / Treatments   Labs (all labs ordered are listed, but only abnormal results are displayed) Labs Reviewed  URINALYSIS, ROUTINE W REFLEX MICROSCOPIC - Abnormal;  Notable for the following components:      Result Value   Color, Urine YELLOW (*)    APPearance HAZY (*)    Hgb urine dipstick SMALL (*)    Bacteria, UA RARE (*)    All other components within normal limits  WET PREP, GENITAL  CHLAMYDIA/NGC RT PCR ([facility] ONLY)            POC URINE PREG, ED   EKG   RADIOLOGY  No results found.   PROCEDURES:  Critical Care performed: No  Procedures   MEDICATIONS ORDERED IN ED: Medications - No data to display   IMPRESSION / MDM / ASSESSMENT AND PLAN / ED COURSE  I reviewed the triage vital signs and the nursing notes.                              Differential diagnosis includes, but is not limited to, yeast vaginitis, BV, trichomoniasis, gonorrhea, chlamydia  Patient's presentation is most consistent with acute complicated illness / injury requiring diagnostic workup.  Patient's diagnosis is consistent with  vaginal irritation.  Patient with a wet prep is negative at this time.  GC is pending with low clinical concern for STD exposure. Patient is to follow up with her PCP or GYN as discussed as needed or otherwise directed. Patient is given ED precautions to return to the ED for any worsening or new symptoms.  FINAL CLINICAL IMPRESSION(S) / ED DIAGNOSES   Final diagnoses:  Vaginal irritation     Rx / DC Orders   ED Discharge Orders     None        Note:  This document was prepared using Dragon voice recognition software and may include unintentional dictation errors.    Lissa Hoard, PA-C 12/04/22 2116    Janith Lima, MD 12/08/22 [phone]

## 2022-12-16 ENCOUNTER — Encounter: Payer: Self-pay | Admitting: Family Medicine

## 2022-12-20 ENCOUNTER — Encounter: Payer: Self-pay | Admitting: Emergency Medicine

## 2022-12-20 ENCOUNTER — Ambulatory Visit
Admission: EM | Admit: 2022-12-20 | Discharge: 2022-12-20 | Disposition: A | Discharge status: Home / Self Care | Payer: 59 | Attending: Emergency Medicine | Admitting: Emergency Medicine

## 2022-12-20 DIAGNOSIS — N898 Other specified noninflammatory disorders of vagina: Secondary | ICD-10-CM | POA: Insufficient documentation

## 2022-12-20 MED ORDER — FLUCONAZOLE 150 MG PO TABS: 150.0000 mg | ORAL_TABLET | ORAL | 0 refills | Status: AC

## 2022-12-20 NOTE — ED Provider Notes (Signed)
Jessica Mcpherson    CSN: [id number] Arrival date & time: 12/20/22  9562      History   Chief Complaint Chief Complaint  Patient presents with   Vaginal Itching    HPI Jessica Mcpherson is a 37 y.o. female.   Patient presents for evaluation of vaginal itching with irritation and thin Trudie Cervantes discharge beginning 1 day ago.  Endorses recent use of 2 antibiotics.  Has attempted use of over-the-counter Monistat which was ineffective.  Sexually active, no concern for STD, endorses recent testing within the last 2 weeks, all negative.  Last menstrual period December 01, 2022 taking birth control.  Denies vaginal odor, abdominal or flank pain, urinary symptoms.  Past Medical History:  Diagnosis Date   Anxiety    Arthritis    Right knee   Depression    GERD (gastroesophageal reflux disease)    Headache    Hypertension    Miscarriage    x2   Papillary thyroid carcinoma [facility])     Patient Active Problem List   Diagnosis Date Noted   Generalized anxiety disorder with panic attacks 08/11/2022   Pre-diabetes 05/09/2022   Snoring 05/01/2022   Encounter for IUD insertion 04/03/2022   Travel advice encounter 02/12/2022   Encounter for general adult medical examination with abnormal findings 01/02/2022   Abdominal pain 10/28/2021   Urinary frequency 10/28/2021   Syncope 06/13/2021   Insomnia 11/07/2020   Gonorrhea 12/26/2019   Papanicolaou smear of cervix with positive high risk human papilloma virus (HPV) test 12/26/2019   Other fatigue 06/18/2019   Papillary thyroid carcinoma (HCC) 05/03/2019   Essential hypertension 05/03/2019   Tobacco abuse 05/03/2019   Depression 05/03/2019   Right rotator cuff tendonitis 01/19/2019   Synovitis of right shoulder 01/19/2019   GAD (generalized anxiety disorder) 08/27/2017    Past Surgical History:  Procedure Laterality Date   CESAREAN SECTION     LAPAROSCOPIC APPENDECTOMY N/A 12/30/2013   Procedure: APPENDECTOMY LAPAROSCOPIC;  Surgeon:  Avel Peace, MD;  Location: [facility];  Service: General;  Laterality: N/A;   THYROIDECTOMY N/A 07/22/2019   Procedure: TOTAL THYROIDECTOMY WITH CENTRAL COMPARTMENT LYMPH NODE DISSECTION ZONE VI;  Surgeon: Darnell Level, MD;  Location: [facility];  Service: General;  Laterality: N/A;    OB History     Gravida  5   Para  1   Term  1   Preterm      AB  2   Living  1      SAB  1   IAB      Ectopic      Multiple      Live Births  1            Home Medications    Prior to Admission medications   Medication Sig Start Date End Date Taking? Authorizing Provider  fluconazole (DIFLUCAN) 150 MG tablet Take 1 tablet (150 mg total) by mouth every 3 (three) days for 2 doses. 12/20/22 12/24/22 Yes Tanaja Ganger, Elita Boone, NP  acetaminophen (TYLENOL) 500 MG tablet Take 1,000 mg by mouth every 6 (six) hours as needed for moderate pain.    [provider]  ALPRAZolam Prudy Feeler) 0.25 MG tablet Take 1 tablet (0.25 mg total) by mouth 2 (two) times daily as needed. 08/28/22   Myrlene Broker, MD  cetirizine (ZYRTEC ALLERGY) 10 MG tablet Take 1 tablet (10 mg total) by mouth daily as needed for allergies. 07/11/22 07/11/23  Pilar Jarvis, MD  cyclobenzaprine (FLEXERIL) 10 MG  tablet Take 1 tablet (10 mg total) by mouth at bedtime. Patient not taking: Reported on 07/18/2022 04/22/22   Immordino, Jeannett Senior, FNP  EPINEPHrine 0.3 mg/0.3 mL IJ SOAJ injection Inject 0.3 mg into the muscle as needed for anaphylaxis. 07/11/22   Pilar Jarvis, MD  etonogestrel-ethinyl estradiol (NUVARING) 0.12-0.015 MG/24HR vaginal ring Insert vaginally and leave in place for 3 consecutive weeks, then remove for 1 week. 06/13/22   Calvert Cantor, CNM  FLUoxetine (PROZAC) 20 MG capsule Take 1 capsule (20 mg total) by mouth daily. 08/11/22   Pincus Sanes, MD  levothyroxine (SYNTHROID) 150 MCG tablet Take 1 tablet (150 mcg total) by mouth daily before breakfast. 09/09/22   Carlus Pavlov, MD  metFORMIN (GLUCOPHAGE-XR) 500 MG  24 hr tablet Take 1 tablet (500 mg total) by mouth daily with breakfast. Patient not taking: Reported on 08/11/2022 05/09/22   Myrlene Broker, MD  methocarbamol (ROBAXIN) 500 MG tablet Take 1 tablet (500 mg total) by mouth 2 (two) times daily as needed for muscle spasms. 07/08/22   Mickie Bail, NP  Multiple Vitamin (MULTIVITAMIN WITH MINERALS) TABS tablet Take 1 tablet by mouth daily.    [provider]  progesterone (PROMETRIUM) 200 MG capsule Place one capsule vaginally at bedtime 03/11/22   Anyanwu, Jethro Bastos, MD  scopolamine (TRANSDERM SCOP, 1.5 MG,) 1 MG/3DAYS Place 1 patch (1.5 mg total) onto the skin every 3 (three) days. 02/12/22   Myrlene Broker, MD  traZODone (DESYREL) 50 MG tablet Take 100 mg by mouth at bedtime as needed for sleep. Patient not taking: Reported on 08/11/2022    [provider]  Vitamin D, Ergocalciferol, (DRISDOL) 1.25 MG (50000 UNIT) CAPS capsule Take 1 capsule (50,000 Units total) by mouth every 7 (seven) days. 06/13/21   Myrlene Broker, MD    Family History Family History  Problem Relation Age of Onset   Diabetes Mother    Hypertension Mother    Asthma Mother    Diabetes Maternal Grandmother    Hypertension Maternal Grandmother    Arthritis Maternal Grandmother    Heart disease Maternal Grandmother    Diabetes Maternal Grandfather    Arthritis Maternal Grandfather    Heart disease Maternal Grandfather    Hypertension Father     Social History Social History   Tobacco Use   Smoking status: Every Day    Current packs/day: 0.10    Types: Cigarettes   Smokeless tobacco: Never  Vaping Use   Vaping status: Never Used  Substance Use Topics   Alcohol use: No    Alcohol/week: 0.0 standard drinks of alcohol   Drug use: No     Allergies   Penicillins and Pepperoni [pickled meat]   Review of Systems Review of Systems   Physical Exam Triage Vital Signs ED Triage Vitals  Encounter Vitals Group     BP 12/20/22  0836 (!) 146/96     Systolic BP Percentile --      Diastolic BP Percentile --      Pulse Rate 12/20/22 0836 93     Resp 12/20/22 0836 16     Temp 12/20/22 0836 99.2 F (37.3 C)     Temp Source 12/20/22 0836 Oral     SpO2 12/20/22 0836 97 %     Weight --      Height --      Head Circumference --      Peak Flow --      Pain Score 12/20/22 0835 0  Pain Loc --      Pain Education --      Exclude from Growth Chart --    No data found.  Updated Vital Signs BP (!) 146/96 (BP Location: Left Arm)   Pulse 93   Temp 99.2 F (37.3 C) (Oral)   Resp 16   LMP 12/01/2022   SpO2 97%   Visual Acuity Right Eye Distance:   Left Eye Distance:   Bilateral Distance:    Right Eye Near:   Left Eye Near:    Bilateral Near:     Physical Exam Constitutional:      Appearance: Normal appearance.  Eyes:     Extraocular Movements: Extraocular movements intact.  Pulmonary:     Effort: Pulmonary effort is normal.  Genitourinary:    Comments: deferred Neurological:     Mental Status: She is alert and oriented to person, place, and time. Mental status is at baseline.      UC Treatments / Results  Labs (all labs ordered are listed, but only abnormal results are displayed) Labs Reviewed  CERVICOVAGINAL ANCILLARY ONLY    EKG   Radiology No results found.  Procedures Procedures (including critical care time)  Medications Ordered in UC Medications - No data to display  Initial Impression / Assessment and Plan / UC Course  I have reviewed the triage vital signs and the nursing notes.  Pertinent labs & imaging results that were available during my care of the patient were reviewed by me and considered in my medical decision making (see chart for details).  Vaginal irritation  Prophylactically treating for yeast, Diflucan prescribed and discussed administration vaginal swab checking for yeast and BV pending, will treat further per protocol, advised abstinence during treatment  until labs results, given handout of additional supportive measures Final Clinical Impressions(s) / UC Diagnoses   Final diagnoses:  Vaginal irritation     Discharge Instructions      Today you are being treated prophylactically for yeast.   Take diflucan 150 mg once, if symptoms still present in 3 days then you may take second pill   Yeast infections which are caused by a naturally occurring fungus called candida. Vaginosis is an inflammation of the vagina that can result in discharge, itching and pain. The cause is usually a change in the normal balance of vaginal bacteria or an infection. Vaginosis can also result from reduced estrogen levels after menopause.  Labs pending 2-3 days, you will be contacted if positive and treatment will be sent to the pharmacy,  it is recommended you use some form of protection against the transmission of sti infections  such as condoms or dental dams with each sexual encounter    In addition:   Avoid baths, hot tubs and whirlpool spas.  Don't use scented or harsh soaps, such as those with deodorant or antibacterial action. Avoid irritants. These include scented tampons and pads. Wipe from front to back after using the toilet.  Don't douche. Your vagina doesn't require cleansing other than normal bathing.  Use a  condom. Wear cotton underwear, this fabric helps absorb moisture     ED Prescriptions     Medication Sig Dispense Auth. Provider   fluconazole (DIFLUCAN) 150 MG tablet Take 1 tablet (150 mg total) by mouth every 3 (three) days for 2 doses. 2 tablet Valinda Hoar, NP      PDMP not reviewed this encounter.   Valinda Hoar, [place] 12/20/22 [phone]

## 2022-12-20 NOTE — ED Triage Notes (Signed)
Pt c/o vaginal itching since yesterday.

## 2022-12-20 NOTE — Discharge Instructions (Signed)
Today you are being treated prophylactically for yeast.   Take diflucan 150 mg once, if symptoms still present in 3 days then you may take second pill   Yeast infections which are caused by a naturally occurring fungus called candida. Vaginosis is an inflammation of the vagina that can result in discharge, itching and pain. The cause is usually a change in the normal balance of vaginal bacteria or an infection. Vaginosis can also result from reduced estrogen levels after menopause.  Labs pending 2-3 days, you will be contacted if positive and treatment will be sent to the pharmacy,  it is recommended you use some form of protection against the transmission of sti infections  such as condoms or dental dams with each sexual encounter    In addition:   Avoid baths, hot tubs and whirlpool spas.  Don't use scented or harsh soaps, such as those with deodorant or antibacterial action. Avoid irritants. These include scented tampons and pads. Wipe from front to back after using the toilet.  Don't douche. Your vagina doesn't require cleansing other than normal bathing.  Use a  condom. Wear cotton underwear, this fabric helps absorb moisture

## 2022-12-22 LAB — CERVICOVAGINAL ANCILLARY ONLY
Bacterial Vaginitis (gardnerella): NEGATIVE
Candida Glabrata: NEGATIVE
Candida Vaginitis: POSITIVE — AB
Comment: NEGATIVE
Comment: NEGATIVE
Comment: NEGATIVE

## 2022-12-24 ENCOUNTER — Encounter: Payer: Self-pay | Admitting: Family Medicine

## 2022-12-25 ENCOUNTER — Other Ambulatory Visit: Payer: Self-pay | Admitting: *Deleted

## 2022-12-25 MED ORDER — FLUCONAZOLE 150 MG PO TABS: 150.0000 mg | ORAL_TABLET | Freq: Once | ORAL | 3 refills | Status: AC

## 2022-12-28 ENCOUNTER — Encounter: Payer: Self-pay | Admitting: Internal Medicine

## 2022-12-28 NOTE — Progress Notes (Unsigned)
    Subjective:    Patient ID: Jessica Mcpherson, female    DOB: 1985-01-22, 37 y.o.   MRN: [id number]      HPI Kyliah is here for No chief complaint on file.    Weight gain -     Medications and allergies reviewed with patient and updated if appropriate.  Current Outpatient Medications on File Prior to Visit  Medication Sig Dispense Refill   acetaminophen (TYLENOL) 500 MG tablet Take 1,000 mg by mouth every 6 (six) hours as needed for moderate pain.     ALPRAZolam (XANAX) 0.25 MG tablet Take 1 tablet (0.25 mg total) by mouth 2 (two) times daily as needed. 10 tablet 0   cetirizine (ZYRTEC ALLERGY) 10 MG tablet Take 1 tablet (10 mg total) by mouth daily as needed for allergies. 30 tablet 2   cyclobenzaprine (FLEXERIL) 10 MG tablet Take 1 tablet (10 mg total) by mouth at bedtime. (Patient not taking: Reported on 07/18/2022) 20 tablet 0   EPINEPHrine 0.3 mg/0.3 mL IJ SOAJ injection Inject 0.3 mg into the muscle as needed for anaphylaxis. 1 each 0   etonogestrel-ethinyl estradiol (NUVARING) 0.12-0.015 MG/24HR vaginal ring Insert vaginally and leave in place for 3 consecutive weeks, then remove for 1 week. 3 each 8   FLUoxetine (PROZAC) 20 MG capsule Take 1 capsule (20 mg total) by mouth daily. 30 capsule 5   levothyroxine (SYNTHROID) 150 MCG tablet Take 1 tablet (150 mcg total) by mouth daily before breakfast. 45 tablet 3   metFORMIN (GLUCOPHAGE-XR) 500 MG 24 hr tablet Take 1 tablet (500 mg total) by mouth daily with breakfast. (Patient not taking: Reported on 08/11/2022) 90 tablet 1   methocarbamol (ROBAXIN) 500 MG tablet Take 1 tablet (500 mg total) by mouth 2 (two) times daily as needed for muscle spasms. 10 tablet 0   Multiple Vitamin (MULTIVITAMIN WITH MINERALS) TABS tablet Take 1 tablet by mouth daily.     progesterone (PROMETRIUM) 200 MG capsule Place one capsule vaginally at bedtime 30 capsule 3   scopolamine (TRANSDERM SCOP, 1.5 MG,) 1 MG/3DAYS Place 1 patch (1.5 mg total) onto the skin  every 3 (three) days. 3 patch 0   traZODone (DESYREL) 50 MG tablet Take 100 mg by mouth at bedtime as needed for sleep. (Patient not taking: Reported on 08/11/2022)     Vitamin D, Ergocalciferol, (DRISDOL) 1.25 MG (50000 UNIT) CAPS capsule Take 1 capsule (50,000 Units total) by mouth every 7 (seven) days. 12 capsule 3   No current facility-administered medications on file prior to visit.    Review of Systems     Objective:  There were no vitals filed for this visit. BP Readings from Last 3 Encounters:  12/20/22 (!) 146/96  12/04/22 (!) 150/105  08/11/22 130/72   Wt Readings from Last 3 Encounters:  12/04/22 180 lb (81.6 kg)  08/11/22 174 lb (78.9 kg)  07/18/22 176 lb (79.8 kg)   There is no height or weight on file to calculate BMI.    Physical Exam         Assessment & Plan:    See Problem List for Assessment and Plan of chronic medical problems.

## 2022-12-29 ENCOUNTER — Ambulatory Visit: Payer: 59 | Admitting: Internal Medicine

## 2022-12-29 ENCOUNTER — Encounter: Payer: Self-pay | Admitting: Internal Medicine

## 2022-12-29 VITALS — BP 120/80 | HR 94 | Ht 59.0 in | Wt 185.6 lb

## 2022-12-29 DIAGNOSIS — C73 Malignant neoplasm of thyroid gland: Secondary | ICD-10-CM | POA: Diagnosis not present

## 2022-12-29 DIAGNOSIS — E890 Postprocedural hypothyroidism: Secondary | ICD-10-CM | POA: Diagnosis not present

## 2022-12-29 NOTE — Progress Notes (Unsigned)
Patient ID: Jessica Mcpherson, female   DOB: Sep 19, 1985, 37 y.o.   MRN: [id number]    HPI  Jessica Mcpherson is a 37 y.o.-year-old female, referred by her PCP, Dr. Okey Dupre, for management of thyroid cancer and postsurgical hypothyroidism.  Last visit 8 months ago.  Interim hx: She describes weight gain (15 lbs before last OV but per our scale, she is only + 4 pounds since then-boots on), hair loss (chronic). She was dx'ed with prediabetes in the last year. She also describes night sweats.  Reviewed history: Pt. has been dx with thyroid cancer in 03/2019.   Reviewed her thyroid cancer history: 03/2019: Presented to UC for shoulder pain >> neck fullness on palpation  03/24/2019: Thyroid ultrasound: 3.4 x 1.5 x 2.1 cm solid left mid hypoechoic thyroid nodule with microcalcifications.  04/14/2019: FNA of the nodule: Suspicious for PTC  07/22/2019: Total thyroidectomy with central (zone 6) lymph node dissection:  A. THYROID GLAND, TOTAL, THYROIDECTOMY:  - Papillary thyroid carcinoma, multifocal, 2.4 cm in greatest dimension,  with angioinvasion, extrathyroidal extension and margin involvement.  - See oncology table.  B. LYMPH NODES, CENTRAL COMPARTMENT/ZONE VI, REGIONAL RESECTION:  - Metastatic carcinoma in (2) of (2) lymph nodes with extranodal  extension.  - Thymic tissue with focal involvement of peri-thymic connective tissue  by carcinoma.   ONCOLOGY TABLE:  THYROID GLAND:  Procedure: Total thyroidectomy  Tumor Focality: Multifocal  Tumor Site: Left lobe, right lobe  Tumor Size: Greatest dimension: 2.4 cm  Histologic Type: Papillary carcinoma  Margins: Positive for carcinoma in area of extrathyroidal extension (left lobe)  Angioinvasion: Present  Lymphatic Invasion: Present  Extrathyroidal Extension: Present  Regional Lymph Nodes:       Number of Lymph Nodes Involved: 2       Nodal Levels Involved: Central compartment/Zone VI       Size of Largest Metastatic Deposit: 1 cm        Extranodal Extension (ENE): Present       Number of Lymph Nodes Examined: 2       Nodal Levels Examined: Central compartment/Zone VI  Pathologic Stage Classification (pTNM, AJCC 8th Edition): mpT3, pN1a  Representative Tumor Block: A2  Comment: Case discussed with Dr. Darnell Level on 07/28/2019.  Dr. Luisa Hart reviewed select slides.   07/27/2019: CT neck obtained for swelling and pain: edema at thyroid site, no abscess.  10/19/2019: RAI treatment - 124.1 mCi I-131 sodium iodide  10/28/2019: Posttreatment whole-body scan:  1. Expected tracer uptake identified within the thyroid bed compatible with residual functioning thyroid tissue. No signs of I-131 avid nodal metastasis or distant metastatic disease.  Neck U/S (03/01/2021): Soft tissue mass in the left neck near the surgical bed -potential residual/recurrent tissue  CT neck (03/22/2021): Mildly increased lymph node in the left jugular chain, suspicious for lymph node metastasis, but unchanged since 2021  I recommended a core biopsy of the enlarged lymph node but she no showed 3x for the appointment.  Neck U/S (05/15/2022): The thyroid gland is surgically absent.   No evidence of residual or recurrent thyroid tissue.   Evaluation of the left cervical chain again demonstrates multiple morphologically abnormal lymph nodes which are heterogeneous and contain punctate echogenic foci. The largest individual node measures up to 7 mm in short axis which is unchanged compared to prior.   IMPRESSION: 1. Similar appearance of morphologically abnormal lymph nodes along the left jugular chain. Internal echogenic foci are concerning for psammoma bodies and metastatic papillary thyroid carcinoma. However, stability  over the past 1 year is reassuring. These may represent quiescent treated lymph nodes. Recommend correlation with lab values. 2. Surgical changes of total thyroidectomy without evidence of residual or recurrent thyroid tissue in the  resection bed.  After the above results I did not suggest a biopsy but follow-up the thyroglobulin and ultrasounds.  Reviewed previous thyroglobulin and ATA antibody levels: Lab Results  Component Value Date   THYROGLB 2.6 (L) 05/01/2022   THYROGLB 5.9 12/26/2021   THYROGLB 4.6 12/26/2020   THYROGLB 9.2 07/04/2020   THYROGLB 10.0 12/28/2019   Lab Results  Component Value Date   THGAB 7 (H) 05/01/2022   THGAB 1 12/26/2021   THGAB <1 12/26/2020   THGAB <1 07/04/2020   THGAB <1 12/28/2019   Pt denies: - feeling nodules in neck - hoarseness - dysphagia (only with pills) - choking  Calcium was normal after surgery: Lab Results  Component Value Date   CALCIUM 9.4 09/03/2022   CALCIUM 9.1 12/20/2021   CALCIUM 9.1 10/28/2021   CALCIUM 9.4 06/12/2021   CALCIUM 9.6 11/07/2020   CALCIUM 9.5 12/07/2019   CALCIUM 9.1 07/27/2019   CALCIUM 9.2 07/23/2019   CALCIUM 9.2 06/17/2019   CALCIUM 9.0 10/12/2018  She was on calcium supplements postop, now off.  Postsurgical hypothyroidism  Pt is on levothyroxine 150 mcg daily: - not missing doses - in am - fasting - at least 30-60 min from b'fast - no calcium - no iron - + MVI later in the day (at night) - not lately  - no PPIs - not on Biotin - on vit D occasionally  Reviewed her TFTs: Lab Results  Component Value Date   TSH 1.22 09/03/2022   TSH 1.02 07/02/2022   TSH 23.54 (H) 05/01/2022   TSH 1.60 02/12/2022   TSH 10.81 (H) 12/26/2021   TSH 1.79 07/24/2021   TSH 3.93 06/12/2021   TSH 1.19 12/26/2020   TSH 0.88 10/17/2020   TSH 2.58 07/04/2020   FREET4 1.24 09/03/2022   FREET4 0.93 07/02/2022   FREET4 0.64 05/01/2022   FREET4 0.93 02/12/2022   FREET4 0.81 12/26/2021   FREET4 1.10 07/24/2021   FREET4 0.87 06/12/2021   FREET4 0.93 12/26/2020   FREET4 0.77 10/17/2020   FREET4 0.98 07/04/2020  04/12/2019: TSH 3.194 01/04/2019: TSH 1.11  She has + FH of thyroid disorders in: MGM - thyroid nodule.  No family  history of thyroid cancer.  No history of radiation to head or neck other than RAI treatment. No herbal supplements. No Biotin use.  No steroids.  She also has prediabetes: Lab Results  Component Value Date   HGBA1C 5.6 (A) 07/18/2022   HGBA1C 5.6 07/18/2022   HGBA1C 5.6 (A) 07/18/2022   HGBA1C 5.6 07/18/2022   HGBA1C 5.9 05/01/2022   HGBA1C 5.7 12/07/2019   She also has a history of HTN, depression/anxiety. She contacted me that she was pregnant in 10/2020.  However, she had a miscarriage in 11/2020.  She also contacted me that she was pregnant in 02/2022, however, she had a miscarriage very soon afterwards. She is now on [employer] now.  ROS: + see HPI   I reviewed pt's medications, allergies, PMH, social hx, family hx, and changes were documented in the history of present illness. Otherwise, unchanged from my initial visit note.  Past Medical History:  Diagnosis Date   Anxiety    Arthritis    Right knee   Depression    GERD (gastroesophageal reflux disease)  Headache    Hypertension    Miscarriage    x2   Papillary thyroid carcinoma [facility])    Past Surgical History:  Procedure Laterality Date   CESAREAN SECTION     LAPAROSCOPIC APPENDECTOMY N/A 12/30/2013   Procedure: APPENDECTOMY LAPAROSCOPIC;  Surgeon: Avel Peace, MD;  Location: [facility];  Service: General;  Laterality: N/A;   THYROIDECTOMY N/A 07/22/2019   Procedure: TOTAL THYROIDECTOMY WITH CENTRAL COMPARTMENT LYMPH NODE DISSECTION ZONE VI;  Surgeon: Darnell Level, MD;  Location: [facility];  Service: General;  Laterality: N/A;   Social History   Socioeconomic History   Marital status: Single    Spouse name: Not on file   Number of children: 1   Years of education: Not on file   Highest education level: 12th grade  Occupational History   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  Tobacco Use   Smoking status: Every Day    Current packs/day: 0.10    Types: Cigarettes   Smokeless tobacco: Never  Vaping Use   Vaping  status: Never Used  Substance and Sexual Activity   Alcohol use: No    Alcohol/week: 0.0 standard drinks of alcohol   Drug use: No   Sexual activity: Yes    Partners: Male    Birth control/protection: None  Other Topics Concern   Not on file  Social History Narrative   Not on file   Social Drivers of Health   Financial Resource Strain: Low Risk  (05/01/2022)   Overall Financial Resource Strain (CARDIA)    Difficulty of Paying Living Expenses: Not very hard  Food Insecurity: No Food Insecurity (05/01/2022)   Hunger Vital Sign    Worried About Running Out of Food in the Last Year: Never true    Ran Out of Food in the Last Year: Never true  Transportation Needs: No Transportation Needs (05/01/2022)   PRAPARE - Administrator, Civil Service (Medical): No    Lack of Transportation (Non-Medical): No  Physical Activity: Sufficiently Active (05/01/2022)   Exercise Vital Sign    Days of Exercise per Week: 3 days    Minutes of Exercise per Session: 50 min  Stress: Stress Concern Present (05/01/2022)   [employer] of Occupational Health - Occupational Stress Questionnaire    Feeling of Stress : Very much  Social Connections: Socially Isolated (05/01/2022)   Social Connection and Isolation Panel [NHANES]    Frequency of Communication with Friends and Family: More than three times a week    Frequency of Social Gatherings with Friends and Family: Once a week    Attends Religious Services: Never    Database administrator or Organizations: No    Attends Engineer, structural: Not on file    Marital Status: Never married  Intimate Partner Violence: Not on file   Current Outpatient Medications on File Prior to Visit  Medication Sig Dispense Refill   acetaminophen (TYLENOL) 500 MG tablet Take 1,000 mg by mouth every 6 (six) hours as needed for moderate pain.     ALPRAZolam (XANAX) 0.25 MG tablet Take 1 tablet (0.25 mg total) by mouth 2 (two) times daily as needed. 10  tablet 0   cetirizine (ZYRTEC ALLERGY) 10 MG tablet Take 1 tablet (10 mg total) by mouth daily as needed for allergies. 30 tablet 2   EPINEPHrine 0.3 mg/0.3 mL IJ SOAJ injection Inject 0.3 mg into the muscle as needed for anaphylaxis. 1 each 0   etonogestrel-ethinyl estradiol (NUVARING) 0.12-0.015 MG/24HR vaginal ring Insert  vaginally and leave in place for 3 consecutive weeks, then remove for 1 week. 3 each 8   FLUoxetine (PROZAC) 20 MG capsule Take 1 capsule (20 mg total) by mouth daily. 30 capsule 5   levothyroxine (SYNTHROID) 150 MCG tablet Take 1 tablet (150 mcg total) by mouth daily before breakfast. 45 tablet 3   metFORMIN (GLUCOPHAGE-XR) 500 MG 24 hr tablet Take 1 tablet (500 mg total) by mouth daily with breakfast. (Patient not taking: Reported on 08/11/2022) 90 tablet 1   methocarbamol (ROBAXIN) 500 MG tablet Take 1 tablet (500 mg total) by mouth 2 (two) times daily as needed for muscle spasms. 10 tablet 0   Multiple Vitamin (MULTIVITAMIN WITH MINERALS) TABS tablet Take 1 tablet by mouth daily.     progesterone (PROMETRIUM) 200 MG capsule Place one capsule vaginally at bedtime 30 capsule 3   scopolamine (TRANSDERM SCOP, 1.5 MG,) 1 MG/3DAYS Place 1 patch (1.5 mg total) onto the skin every 3 (three) days. 3 patch 0   traZODone (DESYREL) 50 MG tablet Take 100 mg by mouth at bedtime as needed for sleep. (Patient not taking: Reported on 08/11/2022)     Vitamin D, Ergocalciferol, (DRISDOL) 1.25 MG (50000 UNIT) CAPS capsule Take 1 capsule (50,000 Units total) by mouth every 7 (seven) days. 12 capsule 3   No current facility-administered medications on file prior to visit.   Allergies  Allergen Reactions   Penicillins Hives and Other (See Comments)    CHILDHOOD ALLERGY Has patient had a PCN reaction causing immediate rash, facial/tongue/throat swelling, SOB or lightheadedness with hypotension: YES Has patient had a PCN reaction causing severe rash involving mucus membranes or skin necrosis:  UNKNOWN Has patient had a PCN reaction that required hospitalization: YES Has patient had a PCN reaction occurring within the last 10 years: No If all of the above answers are "NO", then may proceed with Cephalosporin use. Received Ceftriaxone 1g IV 12/30/13   Pepperoni [Pickled Meat] Hives   Family History  Problem Relation Age of Onset   Diabetes Mother    Hypertension Mother    Asthma Mother    Diabetes Maternal Grandmother    Hypertension Maternal Grandmother    Arthritis Maternal Grandmother    Heart disease Maternal Grandmother    Diabetes Maternal Grandfather    Arthritis Maternal Grandfather    Heart disease Maternal Grandfather    Hypertension Father   + See HPI  PE: BP 120/80   Pulse 94   Ht 4\' 11"  (1.499 m)   Wt 185 lb 9.6 oz (84.2 kg)   LMP 12/01/2022   SpO2 99%   BMI 37.49 kg/m  Wt Readings from Last 10 Encounters:  12/29/22 185 lb 9.6 oz (84.2 kg)  12/04/22 180 lb (81.6 kg)  08/11/22 174 lb (78.9 kg)  07/18/22 176 lb (79.8 kg)  07/11/22 181 lb (82.1 kg)  05/01/22 181 lb (82.1 kg)  05/01/22 180 lb 9.6 oz (81.9 kg)  04/07/22 170 lb (77.1 kg)  04/03/22 179 lb (81.2 kg)  03/11/22 170 lb (77.1 kg)   Constitutional: overweight, in NAD Eyes:  EOMI, no exophthalmos ENT: no neck masses, + thyroidectomy scar healed, with minimal keloid, no cervical lymphadenopathy Cardiovascular: tachycardia, RR, No MRG Respiratory: CTA B Musculoskeletal: no deformities Skin:no rashes Neurological: no tremor with outstretched hands  ASSESSMENT: 1. Thyroid cancer - see HPI  2. Postsurgical Hypothyroidism  PLAN:  1.  Papillary thyroid cancer -Patient with clinically stage I TNM thyroid cancer due to age (no distant metastasis  comfort so far) but with pathologically pT3 N1a -Her tumor was larger than 1.5 cm, multifocal, and had lymphovascular invasion and extension outside the thyroid so RAI treatment was indicated.  She had this in 10/2019.  Posttreatment whole-body scan  showed no metastasis or abnormal uptake sites.  However, on the ultrasound from 02/2021, there was a soft tissue mass in the left neck, near the surgical bed.  We checked the neck CT and it showed enlarged lymph nodes in the left jugular chain, suspicious for metastasis.  However, this was stable from 2021.  I still recommended a biopsy at that time and ordered a core biopsy to be done at the hospital but she no showed 3 times for this procedure.  The last time, she was pregnant but she unfortunately had a miscarriage afterwards. -At last visit, we checked another thyroid ultrasound (05/15/2022) and this showed stable appearance of the lymph nodes over 1 year, reassuring that it may represent a quiescent treated lymph nodes.  After this result, we decided to just keep an eye on this site, but hold off proceeding with the biopsy. -Her thyroglobulin levels are not undetectable and unfortunately her TFTs are also fluctuating which can influence the thyroglobulin levels. -At last visit, thyroglobulin was lower than before but her ATA antibodies are higher.  We will repeat this today. -I we will see her back in 6 months  2.  Patient with history of thyroidectomy for thyroid cancer, now with iatrogenic hypothyroidism, on levothyroxine therapy - latest thyroid labs reviewed with pt. >> normal, but higher than needed in the setting of thyroid cancer so we increased her LT4 dose: Lab Results  Component Value Date   TSH 1.22 09/03/2022  - she continues on LT4 150 mcg daily - pt feels good on this dose, continues to have hair loss.  Fatigue improved after increasing the dose.  She does describe a diagnosis of prediabetes but latest HbA1c was normal. - we discussed about taking the thyroid hormone every day, with water, >30 minutes before breakfast, separated by >4 hours from acid reflux medications, calcium, iron, multivitamins. Pt. is taking it correctly. - will check thyroid tests today: TSH and fT4 - target for  her ThyCA would be around the lower limit of the target range or slightly lower - If labs are abnormal, she will need to return for repeat TFTs in 1.5 months  Needs LT4 refills.  Component     Latest Ref Rng 12/29/2022  TSH     mIU/L 0.67   Thyroglobulin Ab     < or = 1 IU/mL 15 (H)   Thyroglobulin     ng/mL 0.4 (L)   TSH is at goal.  Thyroglobulin level is much lower but the antibodies are increasing. Because of the discrepancy in the above labs, I would suggest to recheck the thyroglobulin and ATA antibodies with the [facility] assay. After the results are back, we may need to go ahead with the core biopsy.  Carlus Pavlov, MD PhD [facility] Endocrinology

## 2022-12-29 NOTE — Patient Instructions (Signed)
Please stop at the lab.  Please continue levothyroxine 150 mcg daily.  Take the thyroid hormone every day, with water, at least 30 minutes before breakfast, separated by at least 4 hours from: - acid reflux medications - calcium - iron - multivitamins  Please come back for a follow-up appointment in 6 months.

## 2022-12-30 LAB — THYROGLOBULIN LEVEL: Thyroglobulin: 0.4 ng/mL — ABNORMAL LOW

## 2022-12-30 LAB — TSH: TSH: 0.67 m[IU]/L

## 2022-12-30 LAB — THYROGLOBULIN ANTIBODY: Thyroglobulin Ab: 15 [IU]/mL — ABNORMAL HIGH (ref <=1)

## 2022-12-31 ENCOUNTER — Encounter: Payer: Self-pay | Admitting: Internal Medicine

## 2022-12-31 DIAGNOSIS — C73 Malignant neoplasm of thyroid gland: Secondary | ICD-10-CM

## 2022-12-31 MED ORDER — LEVOTHYROXINE SODIUM 150 MCG PO TABS: 150.0000 ug | ORAL_TABLET | Freq: Every day | ORAL | 3 refills | Status: AC

## 2023-01-05 ENCOUNTER — Other Ambulatory Visit: Payer: Self-pay | Admitting: Internal Medicine

## 2023-01-09 ENCOUNTER — Encounter: Payer: 59 | Admitting: Internal Medicine

## 2023-01-13 LAB — THYROGLOBULIN BY LCMS: Thyroglobulin by LCMS: 0.5 ng/mL — ABNORMAL LOW (ref 1.5–38.5)

## 2023-01-13 LAB — TGAB+THYROGLOBULIN IMA OR LCMS: Thyroglobulin Antibody: 18.6 [IU]/mL — ABNORMAL HIGH (ref 0.0–0.9)

## 2023-01-19 ENCOUNTER — Ambulatory Visit: Payer: 59 | Admitting: Internal Medicine

## 2023-06-29 ENCOUNTER — Ambulatory Visit: Payer: 59 | Admitting: Internal Medicine

## 2023-09-25 ENCOUNTER — Other Ambulatory Visit: Payer: Self-pay

## 2023-09-25 DIAGNOSIS — G8918 Other acute postprocedural pain: Secondary | ICD-10-CM | POA: Diagnosis not present

## 2023-09-25 DIAGNOSIS — D72829 Elevated white blood cell count, unspecified: Secondary | ICD-10-CM | POA: Diagnosis not present

## 2023-09-25 DIAGNOSIS — R1310 Dysphagia, unspecified: Secondary | ICD-10-CM | POA: Insufficient documentation

## 2023-09-25 DIAGNOSIS — R221 Localized swelling, mass and lump, neck: Secondary | ICD-10-CM | POA: Insufficient documentation

## 2023-09-25 DIAGNOSIS — Z85850 Personal history of malignant neoplasm of thyroid: Secondary | ICD-10-CM | POA: Diagnosis not present

## 2023-09-25 LAB — POC URINE PREG, ED: Preg Test, Ur: NEGATIVE

## 2023-09-25 NOTE — ED Triage Notes (Signed)
 Pt ambulatory into triage tonight with c/o neck swelling, pain and difficulty swallowing post neck lymph node surgery 09/22/2023 at [facility]. Pt airway in tact and able to keep secretions down. Pt oxygen is 98 in triage.

## 2023-09-26 ENCOUNTER — Emergency Department

## 2023-09-26 ENCOUNTER — Emergency Department: Admission: EM | Admit: 2023-09-26 | Discharge: 2023-09-26 | Disposition: A | Discharge status: Home / Self Care

## 2023-09-26 DIAGNOSIS — R221 Localized swelling, mass and lump, neck: Secondary | ICD-10-CM

## 2023-09-26 DIAGNOSIS — G8918 Other acute postprocedural pain: Secondary | ICD-10-CM

## 2023-09-26 LAB — COMPREHENSIVE METABOLIC PANEL WITH GFR
ALT: 36 U/L (ref 0–44)
AST: 32 U/L (ref 15–41)
Albumin: 3.4 g/dL — ABNORMAL LOW (ref 3.5–5.0)
Alkaline Phosphatase: 74 U/L (ref 38–126)
Anion gap: 10 (ref 5–15)
BUN: 10 mg/dL (ref 6–20)
CO2: 24 mmol/L (ref 22–32)
Calcium: 9 mg/dL (ref 8.9–10.3)
Chloride: 102 mmol/L (ref 98–111)
Creatinine, Ser: 0.69 mg/dL (ref 0.44–1.00)
GFR, Estimated: >60 mL/min (ref >60)
Glucose, Bld: 98 mg/dL (ref 70–99)
Potassium: 3.9 mmol/L (ref 3.5–5.1)
Sodium: 136 mmol/L (ref 135–145)
Total Bilirubin: 0.4 mg/dL (ref 0.0–1.2)
Total Protein: 7.4 g/dL (ref 6.5–8.1)

## 2023-09-26 LAB — CBC WITH DIFFERENTIAL/PLATELET
Abs Immature Granulocytes: 0.07 K/uL (ref 0.00–0.07)
Basophils Absolute: 0 K/uL (ref 0.0–0.1)
Basophils Relative: 0 %
Eosinophils Absolute: 0.1 K/uL (ref 0.0–0.5)
Eosinophils Relative: 1 %
HCT: 40.8 % (ref 36.0–46.0)
Hemoglobin: 13.2 g/dL (ref 12.0–15.0)
Immature Granulocytes: 1 %
Lymphocytes Relative: 26 %
Lymphs Abs: 3.2 K/uL (ref 0.7–4.0)
MCH: 29.2 pg (ref 26.0–34.0)
MCHC: 32.4 g/dL (ref 30.0–36.0)
MCV: 90.3 fL (ref 80.0–100.0)
Monocytes Absolute: 1 K/uL (ref 0.1–1.0)
Monocytes Relative: 8 %
Neutro Abs: 8.1 K/uL — ABNORMAL HIGH (ref 1.7–7.7)
Neutrophils Relative %: 64 %
Platelets: 336 K/uL (ref 150–400)
RBC: 4.52 MIL/uL (ref 3.87–5.11)
RDW: 13.2 % (ref 11.5–15.5)
WBC: 12.5 K/uL — ABNORMAL HIGH (ref 4.0–10.5)
nRBC: 0 % (ref 0.0–0.2)

## 2023-09-26 LAB — URINALYSIS, W/ REFLEX TO CULTURE (INFECTION SUSPECTED)
Bilirubin Urine: NEGATIVE
Glucose, UA: NEGATIVE mg/dL
Hgb urine dipstick: NEGATIVE
Ketones, ur: NEGATIVE mg/dL
Leukocytes,Ua: NEGATIVE
Nitrite: NEGATIVE
Protein, ur: NEGATIVE mg/dL
Specific Gravity, Urine: 1.013 (ref 1.005–1.030)
pH: 7 (ref 5.0–8.0)

## 2023-09-26 LAB — LACTIC ACID, PLASMA
Lactic Acid, Venous: 0.9 mmol/L (ref 0.5–1.9)
Lactic Acid, Venous: 1 mmol/L (ref 0.5–1.9)

## 2023-09-26 MED ORDER — IBUPROFEN 200 MG PO TABS: 600.0000 mg | ORAL_TABLET | Freq: Three times a day (TID) | ORAL | 2 refills | Status: AC | PRN

## 2023-09-26 MED ORDER — KETOROLAC TROMETHAMINE 15 MG/ML IJ SOLN
15.0000 mg | Freq: Once | INTRAMUSCULAR | Status: AC
  Administered 2023-09-26: 15 mg via INTRAVENOUS
  Filled 2023-09-26: qty 1

## 2023-09-26 MED ORDER — ACETAMINOPHEN 500 MG PO TABS: 1000.0000 mg | ORAL_TABLET | Freq: Four times a day (QID) | ORAL | 2 refills | Status: AC | PRN

## 2023-09-26 MED ORDER — LIDOCAINE VISCOUS HCL 2 % MT SOLN: 15.0000 mL | OROMUCOSAL | 0 refills | Status: AC | PRN

## 2023-09-26 MED ORDER — DEXAMETHASONE SODIUM PHOSPHATE 10 MG/ML IJ SOLN
10.0000 mg | Freq: Once | INTRAMUSCULAR | Status: AC
  Administered 2023-09-26: 10 mg via INTRAVENOUS
  Filled 2023-09-26: qty 1

## 2023-09-26 MED ORDER — DEXAMETHASONE 6 MG PO TABS: 12.0000 mg | ORAL_TABLET | Freq: Once | ORAL | 0 refills | Status: AC

## 2023-09-26 MED ORDER — IOHEXOL 300 MG/ML  SOLN
75.0000 mL | Freq: Once | INTRAMUSCULAR | Status: AC | PRN
  Administered 2023-09-26: 75 mL via INTRAVENOUS

## 2023-09-26 MED ORDER — ONDANSETRON HCL 4 MG/2ML IJ SOLN
4.0000 mg | Freq: Once | INTRAMUSCULAR | Status: AC
  Administered 2023-09-26: 4 mg via INTRAVENOUS
  Filled 2023-09-26: qty 2

## 2023-09-26 NOTE — Discharge Instructions (Addendum)
 Your evaluation in the emergency department was overall reassuring.  We saw a routine postsurgical changes from your surgery but no other complications.  You can use Tylenol , Motrin , and viscous lidocaine  as needed for any ongoing discomfort.  I have also prescribed you a second dose of steroid to take on Monday.  Please follow-up closely with your surgeon and primary care provider for reevaluation, and return to the emergency department with any new or worsening symptoms.

## 2023-09-26 NOTE — ED Provider Notes (Signed)
 [facility] Provider Note    Event Date/Time   First MD Initiated Contact with Patient 09/26/23 0050     (approximate)   History   Post-op Problem  Pt ambulatory into triage tonight with c/o neck swelling, pain and difficulty swallowing post neck lymph node surgery 09/22/2023 at [facility]. Pt airway in tact and able to keep secretions down. Pt oxygen is 98 in triage.    HPI Jessica Mcpherson is a 38 y.o. female PMH papillary thyroid  carcinoma status post thyroid  resection presents for evaluation of reported neck swelling and pain with swallowing - Patient was recently diagnosed with recurrence of thyroid  cancer, had excision of multiple lymph nodes in the left neck on 09/22/2023 at [facility].  Had been doing all right postoperatively but today felt she developed worsened swelling and some pain in her throat and mild difficulty breathing so presents to ED for eval. -No fever -Not on blood thinners -No preceding trauma -History of penicillin allergy     Physical Exam   Triage Vital Signs: ED Triage Vitals  Encounter Vitals Group     BP 09/25/23 2342 (!) 147/86     Girls Systolic BP Percentile --      Girls Diastolic BP Percentile --      Boys Systolic BP Percentile --      Boys Diastolic BP Percentile --      Pulse Rate 09/25/23 2342 75     Resp 09/25/23 2342 17     Temp 09/25/23 2342 98.4 F (36.9 C)     Temp src --      SpO2 09/25/23 2342 98 %     Weight 09/25/23 2342 200 lb (90.7 kg)     Height 09/25/23 2342 5' (1.524 m)     Head Circumference --      Peak Flow --      Pain Score 09/25/23 2358 9     Pain Loc --      Pain Education --      Exclude from Growth Chart --     Most recent vital signs: Vitals:   09/26/23 0630 09/26/23 0700  BP: 103/73 122/78  Pulse: 74 72  Resp:    Temp:    SpO2: 100% 100%     General: Awake, no distress.  HEENT: Moderate left neck/submandibular swelling, no overlying erythema, no stridor, mild trismus, no  uvular deviation, no dental abnormalities appreciated.  Surgical sites clean/dry/intact with no significant surrounding erythema and no fluctuance. CV:  Good peripheral perfusion. RRR, RP 2+ Resp:  Normal effort. CTAB Abd:  No distention. Nontender to deep palpation throughout    ED Results / Procedures / Treatments   Labs (all labs ordered are listed, but only abnormal results are displayed) Labs Reviewed  COMPREHENSIVE METABOLIC PANEL WITH GFR - Abnormal; Notable for the following components:      Result Value   Albumin 3.4 (*)    All other components within normal limits  CBC WITH DIFFERENTIAL/PLATELET - Abnormal; Notable for the following components:   WBC 12.5 (*)    Neutro Abs 8.1 (*)    All other components within normal limits  URINALYSIS, W/ REFLEX TO CULTURE (INFECTION SUSPECTED) - Abnormal; Notable for the following components:   Color, Urine STRAW (*)    APPearance CLEAR (*)    Bacteria, UA FEW (*)    All other components within normal limits  LACTIC ACID, PLASMA  LACTIC ACID, PLASMA  POC URINE PREG, ED  EKG  N/a   RADIOLOGY Radiology interpreted by myself and radiology report reviewed.  No obvious acute pathology identified.  Postsurgical changes seen.  No evidence of underlying abscess or airway compromise.    PROCEDURES:  Critical Care performed: No  Procedures   MEDICATIONS ORDERED IN ED: Medications  dexamethasone  (DECADRON ) injection 10 mg (10 mg Intravenous Given 09/26/23 0152)  ketorolac  (TORADOL ) 15 MG/ML injection 15 mg (15 mg Intravenous Given 09/26/23 0153)  ondansetron  (ZOFRAN ) injection 4 mg (4 mg Intravenous Given 09/26/23 0151)  iohexol  (OMNIPAQUE ) 300 MG/ML solution 75 mL (75 mLs Intravenous Contrast Given 09/26/23 0217)     IMPRESSION / MDM / ASSESSMENT AND PLAN / ED COURSE  I reviewed the triage vital signs and the nursing notes.                              DDX/MDM/AP: Differential diagnosis includes, but is not limited to,  or surgical hematoma, consider early abscess development --mild trismus here, consider early deep neck space infection.  Doubt pneumonia.   Plan: - Labs - CT neck - Chest x-ray - Decadron , Toradol , Zofran  - N.p.o.  Patient's presentation is most consistent with acute presentation with potential threat to life or bodily function.  The patient is on the cardiac monitor to evaluate for evidence of arrhythmia and/or significant heart rate changes.  ED course below.   Clinical Course as of 09/26/23 0809  Sat Sep 26, 2023  0146 CBC with mild leukocytosis, otherwise unremarkable  CMP reviewed, unremarkable  Lactate normal  hCG negative [MM]  0147 Chest x-ray interpreted by myself, no acute pathology identified  Radiology report reviewed, below IMPRESSION: Low lung volumes with no active cardiopulmonary disease.     [MM]  0323 CT neck: IMPRESSION: 1. Sequelae of recent left-sided nodal dissection with associated soft tissue swelling and free fluid within the left neck. No loculated collection to suggest abscess. No other visible complication. 2. No other acute abnormality within the neck. 3. 4 mm right upper lobe pulmonary nodule, indeterminate. Per Fleischner Society Guidelines,if patient is low risk for malignancy, no routine follow-up imaging is recommended. If patient is high risk for malignancy, a non-contrast Chest CT at 12 months is optional. If performed and the nodule is stable at 12 months, no further follow-up is recommended.  Emphysema (ICD10-J43.9).   [MM]  0606 Patient reevaluated, reassured by unremarkable workup.  Symptoms somewhat improved here.  Amenable to outpatient follow-up with her surgeon.  Rx Tylenol , Motrin , second dose of Decadron , viscous lidocaine .  Strict ED return precautions placed.  Patient agrees with plan. [MM]    Clinical Course User Index [MM] Clarine Ozell LABOR, MD     FINAL CLINICAL IMPRESSION(S) / ED DIAGNOSES   Final diagnoses:   Neck swelling  Postoperative pain     Rx / DC Orders   ED Discharge Orders          Ordered    dexamethasone  (DECADRON ) 6 MG tablet   Once        09/26/23 0609    acetaminophen  (TYLENOL ) 500 MG tablet  Every 6 hours PRN        09/26/23 0609    ibuprofen  (MOTRIN  IB) 200 MG tablet  Every 8 hours PRN        09/26/23 0609    lidocaine  (XYLOCAINE ) 2 % solution  As needed        09/26/23 9390  Note:  This document was prepared using Dragon voice recognition software and may include unintentional dictation errors.   Clarine Ozell LABOR, MD 09/26/23 [phone]

## 2023-09-27 ENCOUNTER — Emergency Department: Admission: EM | Admit: 2023-09-27 | Discharge: 2023-09-28 | Disposition: A | Discharge status: Home / Self Care

## 2023-09-27 ENCOUNTER — Emergency Department

## 2023-09-27 ENCOUNTER — Other Ambulatory Visit: Payer: Self-pay

## 2023-09-27 DIAGNOSIS — G8918 Other acute postprocedural pain: Secondary | ICD-10-CM | POA: Insufficient documentation

## 2023-09-27 DIAGNOSIS — Z85850 Personal history of malignant neoplasm of thyroid: Secondary | ICD-10-CM | POA: Diagnosis not present

## 2023-09-27 LAB — CBC WITH DIFFERENTIAL/PLATELET
Abs Immature Granulocytes: 0.09 K/uL — ABNORMAL HIGH (ref 0.00–0.07)
Basophils Absolute: 0 K/uL (ref 0.0–0.1)
Basophils Relative: 0 %
Eosinophils Absolute: 0 K/uL (ref 0.0–0.5)
Eosinophils Relative: 0 %
HCT: 40.9 % (ref 36.0–46.0)
Hemoglobin: 13.6 g/dL (ref 12.0–15.0)
Immature Granulocytes: 1 %
Lymphocytes Relative: 28 %
Lymphs Abs: 4.4 K/uL — ABNORMAL HIGH (ref 0.7–4.0)
MCH: 29.5 pg (ref 26.0–34.0)
MCHC: 33.3 g/dL (ref 30.0–36.0)
MCV: 88.7 fL (ref 80.0–100.0)
Monocytes Absolute: 1.2 K/uL — ABNORMAL HIGH (ref 0.1–1.0)
Monocytes Relative: 8 %
Neutro Abs: 9.9 K/uL — ABNORMAL HIGH (ref 1.7–7.7)
Neutrophils Relative %: 63 %
Platelets: 382 K/uL (ref 150–400)
RBC: 4.61 MIL/uL (ref 3.87–5.11)
RDW: 13.3 % (ref 11.5–15.5)
Smear Review: NORMAL
WBC: 15.7 K/uL — ABNORMAL HIGH (ref 4.0–10.5)
nRBC: 0 % (ref 0.0–0.2)

## 2023-09-27 LAB — COMPREHENSIVE METABOLIC PANEL WITH GFR
ALT: 37 U/L (ref 0–44)
AST: 28 U/L (ref 15–41)
Albumin: 3.6 g/dL (ref 3.5–5.0)
Alkaline Phosphatase: 90 U/L (ref 38–126)
Anion gap: 9 (ref 5–15)
BUN: 14 mg/dL (ref 6–20)
CO2: 24 mmol/L (ref 22–32)
Calcium: 9.4 mg/dL (ref 8.9–10.3)
Chloride: 103 mmol/L (ref 98–111)
Creatinine, Ser: 0.82 mg/dL (ref 0.44–1.00)
GFR, Estimated: >60 mL/min (ref >60)
Glucose, Bld: 91 mg/dL (ref 70–99)
Potassium: 4 mmol/L (ref 3.5–5.1)
Sodium: 136 mmol/L (ref 135–145)
Total Bilirubin: 0.5 mg/dL (ref 0.0–1.2)
Total Protein: 7.8 g/dL (ref 6.5–8.1)

## 2023-09-27 MED ORDER — IOHEXOL 300 MG/ML  SOLN
75.0000 mL | Freq: Once | INTRAMUSCULAR | Status: AC | PRN
  Administered 2023-09-27: 75 mL via INTRAVENOUS

## 2023-09-27 NOTE — ED Provider Notes (Signed)
 [facility] Provider Note    Event Date/Time   First MD Initiated Contact with Patient 09/27/23 2332     (approximate)   History   Post-op Problem  Patient ambulatory to triage with complaints of worsening left neck swelling. Patient had neck surgery at [facility] 9/9, was seen Friday for the same but states it feels worse today. This RN does not appreciate worsening breathing, patient is currently able to swallow secretions.    HPI Jessica Mcpherson is a 38 y.o. female PMH papillary thyroid  carcinoma status post thyroid  resection and recent lymph node resections presents for evaluation of neck swelling -This is patient's second visit to our ED for this problem.  I previously saw her about 48 hours ago for same.  CT reassuring at that time.  No overt signs of infection.  Felt better after pain medications and steroids.  Tentative plan for outpatient follow-up with ED return precautions. -Patient presents today noting that her neck feels somewhat worse.  Notes it feels slightly more difficult to breathing and continues to have discomfort.  No fevers.  Confirms she did take a second dose of steroids that I prescribed her today. -Does not have follow-up with her surgeon until 10/12/2023     Physical Exam   Triage Vital Signs: ED Triage Vitals  Encounter Vitals Group     BP 09/27/23 1931 (!) 152/99     Girls Systolic BP Percentile --      Girls Diastolic BP Percentile --      Boys Systolic BP Percentile --      Boys Diastolic BP Percentile --      Pulse Rate 09/27/23 1931 68     Resp 09/27/23 1931 18     Temp 09/27/23 1931 98 F (36.7 C)     Temp Source 09/27/23 1931 Oral     SpO2 09/27/23 1931 95 %     Weight 09/27/23 1932 199 lb 15.3 oz (90.7 kg)     Height 09/27/23 1932 5' (1.524 m)     Head Circumference --      Peak Flow --      Pain Score 09/27/23 1932 9     Pain Loc --      Pain Education --      Exclude from Growth Chart --     Most recent vital  signs: Vitals:   09/28/23 0100 09/28/23 0215  BP: 121/79   Pulse: 78 65  Resp:    Temp:    SpO2: 100% 100%     General: Awake, no distress.  HEENT: Some left-sided neck swelling.  Surgical sites clean/dry/intact.  No stridor.  No significant tachypnea.  No clear fluctuance.  Some tenderness to palpation over left neck swelling.  Overall appears similar to my prior evaluation about 48 hours ago. CV:  Good peripheral perfusion. RRR, RP 2+ Resp:  Normal effort. CTAB Abd:  No distention.    ED Results / Procedures / Treatments   Labs (all labs ordered are listed, but only abnormal results are displayed) Labs Reviewed  CBC WITH DIFFERENTIAL/PLATELET - Abnormal; Notable for the following components:      Result Value   WBC 15.7 (*)    Neutro Abs 9.9 (*)    Lymphs Abs 4.4 (*)    Monocytes Absolute 1.2 (*)    Abs Immature Granulocytes 0.09 (*)    All other components within normal limits  COMPREHENSIVE METABOLIC PANEL WITH GFR  HCG, QUANTITATIVE, PREGNANCY  EKG  N/a   RADIOLOGY Radiology interpreted by myself and radiology report reviewed.  No clear underlying abscess.    PROCEDURES:  Critical Care performed: No  Procedures   MEDICATIONS ORDERED IN ED: Medications  iohexol  (OMNIPAQUE ) 300 MG/ML solution 75 mL (75 mLs Intravenous Contrast Given 09/27/23 2214)  cefTRIAXone  (ROCEPHIN ) 2 g in sodium chloride  0.9 % 100 mL IVPB (0 g Intravenous Stopped 09/28/23 0224)  doxycycline  (VIBRA -TABS) tablet 100 mg (100 mg Oral Given 09/28/23 0150)     IMPRESSION / MDM / ASSESSMENT AND PLAN / ED COURSE  I reviewed the triage vital signs and the nursing notes.                              DDX/MDM/AP: Differential diagnosis includes, but is not limited to, routine postsurgical changes, consider underlying early infection, consider developing hematoma.   Plan: - Labs - CT neck already performed, overall similar per radiology, do note some stranding --will discuss with ENT  and ask them to review imaging  Patient's presentation is most consistent with acute presentation with potential threat to life or bodily function.  The patient is on the cardiac monitor to evaluate for evidence of arrhythmia and/or significant heart rate changes.  ED course below.  Labs with leukocytosis, has been on steroids, possibly secondary to this.  ENT consulted, no imaging is overall reassuring though given full clinical picture thought it would be reasonable to start antibiotics.  Patient with a history of penicillin allergies, treated with ceftriaxone  and doxycycline  and discharged on cefpodoxime  and doxycycline .  Will call her ENT surgery team today to arrange close outpatient follow-up.  ED return precautions in place.  Patient agrees with plan.  Clinical Course as of 09/28/23 9283  [place] Sep 27, 2023  2353 CT neck: IMPRESSION: 1. Sequelae of recent left-sided nodal dissection with persistent soft tissue swelling and stranding extending along the left cervical chain. Small volume free fluid within this region, partially surrounding the left sternocleidomastoid muscle which is thickened and edematous. No overtly loculated or drainable fluid collections identified. Overall, appearance is similar to prior. 2. No other new acute abnormality within the neck. 3. Few scattered pulmonary nodules within the visualized right upper lobe, largest of which measures 4 mm. Per Fleischner Society Guidelines, if patient is low risk for malignancy, no routine follow-up imaging is recommended. If patient is high risk for malignancy, a non-contrast Chest CT at 12 months is optional. If performed and the nodule is stable at 12 months, no further follow-up is recommended. These guidelines do not apply to immunocompromised patients and patients with cancer. Follow up in patients with significant comorbidities as clinically warranted. For lung cancer screening, adhere to Lung-RADS guidelines.  Reference: Radiology. 2017; 284(1):228-43.   [MM]  2353 CBC with leukocytosis, did recently receive steroids  CMP reviewed, unremarkable [MM]  Mon Sep 28, 2023  0001 ED secretary paging ENT [MM]  0005 Discussed case with Dr. Jessee of ENT, will review imaging and call me back [MM]  0013 Dr Jessee reviewed imaging, overall reassuring, looks stable from prior imaging  Not unreasonable to treat presumed infection, recs   No indication for surgical exploration [MM]    Clinical Course User Index [MM] Clarine Ozell LABOR, MD     FINAL CLINICAL IMPRESSION(S) / ED DIAGNOSES   Final diagnoses:  Postoperative pain     Rx / DC Orders   ED Discharge Orders  Ordered    cefpodoxime  (VANTIN ) 200 MG tablet  2 times daily        09/28/23 0143    doxycycline  (VIBRA -TABS) 100 MG tablet  2 times daily        09/28/23 0143             Note:  This document was prepared using Dragon voice recognition software and may include unintentional dictation errors.   Clarine Ozell LABOR, MD 09/28/23 [phone]

## 2023-09-27 NOTE — ED Provider Notes (Incomplete)
 [facility] Provider Note    Event Date/Time   First MD Initiated Contact with Patient 09/27/23 2332     (approximate)   History   Post-op Problem  Patient ambulatory to triage with complaints of worsening left neck swelling. Patient had neck surgery at [facility] 9/9, was seen Friday for the same but states it feels worse today. This RN does not appreciate worsening breathing, patient is currently able to swallow secretions.    HPI Jessica Mcpherson is a 38 y.o. female PMH papillary thyroid  carcinoma status post thyroid  resection and recent lymph node resections presents for evaluation of neck swelling -This is patient's second visit to our ED for this problem.  I previously saw her about 48 hours ago for same.  CT reassuring at that time.  No overt signs of infection.  Felt better after pain medications and steroids.  Tentative plan for outpatient follow-up with ED return precautions. -Patient presents today noting that her neck feels somewhat worse.     Physical Exam   Triage Vital Signs: ED Triage Vitals  Encounter Vitals Group     BP 09/27/23 1931 (!) 152/99     Girls Systolic BP Percentile --      Girls Diastolic BP Percentile --      Boys Systolic BP Percentile --      Boys Diastolic BP Percentile --      Pulse Rate 09/27/23 1931 68     Resp 09/27/23 1931 18     Temp 09/27/23 1931 98 F (36.7 C)     Temp Source 09/27/23 1931 Oral     SpO2 09/27/23 1931 95 %     Weight 09/27/23 1932 199 lb 15.3 oz (90.7 kg)     Height 09/27/23 1932 5' (1.524 m)     Head Circumference --      Peak Flow --      Pain Score 09/27/23 1932 9     Pain Loc --      Pain Education --      Exclude from Growth Chart --     Most recent vital signs: Vitals:   09/27/23 2340 09/27/23 2345  BP:  128/78  Pulse:  63  Resp:    Temp:    SpO2: 100% 100%    {Only need to document appropriate and relevant physical exam:1} General: Awake, no distress. *** CV:  Good peripheral  perfusion. RRR, RP 2+*** Resp:  Normal effort. CTAB*** Abd:  No distention. Nontender to deep palpation throughout*** Other:  ***   ED Results / Procedures / Treatments   Labs (all labs ordered are listed, but only abnormal results are displayed) Labs Reviewed  CBC WITH DIFFERENTIAL/PLATELET - Abnormal; Notable for the following components:      Result Value   WBC 15.7 (*)    Neutro Abs 9.9 (*)    Lymphs Abs 4.4 (*)    Monocytes Absolute 1.2 (*)    Abs Immature Granulocytes 0.09 (*)    All other components within normal limits  COMPREHENSIVE METABOLIC PANEL WITH GFR     EKG  ***   RADIOLOGY *** {USE THE WORD INTERPRETED!! You MUST document your own interpretation of imaging, as well as the fact that you reviewed the radiologist's report!:1}   PROCEDURES:  Critical Care performed: {CriticalCareYesNo:19197::Yes, see critical care procedure note(s),No}  Procedures   MEDICATIONS ORDERED IN ED: Medications  iohexol  (OMNIPAQUE ) 300 MG/ML solution 75 mL (75 mLs Intravenous Contrast Given 09/27/23 2214)  IMPRESSION / MDM / ASSESSMENT AND PLAN / ED COURSE  I reviewed the triage vital signs and the nursing notes.                              DDX/MDM/AP: Differential diagnosis includes, but is not limited to, ***  Plan: - ***  Patient's presentation is most consistent with {EM COPA:27473}  ***The patient is on the cardiac monitor to evaluate for evidence of arrhythmia and/or significant heart rate changes.***  ED course below. ***  Clinical Course as of 09/28/23 0001  Sun Sep 27, 2023  2353 CT neck: IMPRESSION: 1. Sequelae of recent left-sided nodal dissection with persistent soft tissue swelling and stranding extending along the left cervical chain. Small volume free fluid within this region, partially surrounding the left sternocleidomastoid muscle which is thickened and edematous. No overtly loculated or drainable fluid collections identified.  Overall, appearance is similar to prior. 2. No other new acute abnormality within the neck. 3. Few scattered pulmonary nodules within the visualized right upper lobe, largest of which measures 4 mm. Per Fleischner Society Guidelines, if patient is low risk for malignancy, no routine follow-up imaging is recommended. If patient is high risk for malignancy, a non-contrast Chest CT at 12 months is optional. If performed and the nodule is stable at 12 months, no further follow-up is recommended. These guidelines do not apply to immunocompromised patients and patients with cancer. Follow up in patients with significant comorbidities as clinically warranted. For lung cancer screening, adhere to Lung-RADS guidelines. Reference: Radiology. 2017; 284(1):228-43.   [MM]  2353 CBC with leukocytosis, did recently receive steroids  CMP reviewed, unremarkable [MM]  Mon Sep 28, 2023  0001 ED secretary paging ENT [MM]    Clinical Course User Index [MM] Clarine Ozell LABOR, MD     FINAL CLINICAL IMPRESSION(S) / ED DIAGNOSES   Final diagnoses:  None     Rx / DC Orders   ED Discharge Orders     None        Note:  This document was prepared using Dragon voice recognition software and may include unintentional dictation errors.

## 2023-09-27 NOTE — ED Triage Notes (Signed)
 Patient ambulatory to triage with complaints of worsening left neck swelling. Patient had neck surgery at [facility] 9/9, was seen Friday for the same but states it feels worse today. This RN does not appreciate worsening breathing, patient is currently able to swallow secretions.

## 2023-09-28 LAB — HCG, QUANTITATIVE, PREGNANCY: hCG, Beta Chain, Quant, S: <1 m[IU]/mL (ref <5)

## 2023-09-28 MED ORDER — DOXYCYCLINE HYCLATE 100 MG PO TABS
100.0000 mg | ORAL_TABLET | Freq: Once | ORAL | Status: AC
  Administered 2023-09-28: 100 mg via ORAL
  Filled 2023-09-28: qty 1

## 2023-09-28 MED ORDER — DOXYCYCLINE HYCLATE 100 MG PO TABS: 100.0000 mg | ORAL_TABLET | Freq: Two times a day (BID) | ORAL | 0 refills | Status: AC

## 2023-09-28 MED ORDER — CEFPODOXIME PROXETIL 200 MG PO TABS: 400.0000 mg | ORAL_TABLET | Freq: Two times a day (BID) | ORAL | 0 refills | Status: AC

## 2023-09-28 MED ORDER — SODIUM CHLORIDE 0.9 % IV SOLN
2.0000 g | Freq: Once | INTRAVENOUS | Status: AC
  Administered 2023-09-28: 2 g via INTRAVENOUS
  Filled 2023-09-28: qty 20

## 2023-09-28 NOTE — Discharge Instructions (Signed)
 Your evaluation in the emergency department was overall reassuring.  As discussed, I reviewed your case with our ENT surgeon, and they did not see any concerning findings on the CT either.  There were a few small areas suggestive of possible early infection, and given your white blood cell count elevation we did decide to start you on antibiotics to help prevent development of any infection.  Continue to take Tylenol  and Motrin  as needed for any ongoing discomfort.  Please do call your ENT surgeon today to ask if they can see you sooner in clinic.  Please also follow-up with your primary care provider.  Return to the emergency department with any new or worsening symptoms.

## 2023-11-09 ENCOUNTER — Encounter: Admitting: Internal Medicine
